# Patient Record
Sex: Male | Born: 1935 | Race: White | Hispanic: No | Marital: Married | Smoking: Former smoker
Health system: Southern US, Community
[De-identification: ages and names within clinical notes are randomized; demographics above are authoritative.]

## PROBLEM LIST (undated history)

## (undated) DIAGNOSIS — I10 Essential (primary) hypertension: Secondary | ICD-10-CM

## (undated) DIAGNOSIS — Z860100 Personal history of colon polyps, unspecified: Secondary | ICD-10-CM

## (undated) DIAGNOSIS — E109 Type 1 diabetes mellitus without complications: Secondary | ICD-10-CM

## (undated) DIAGNOSIS — E785 Hyperlipidemia, unspecified: Secondary | ICD-10-CM

## (undated) DIAGNOSIS — I82409 Acute embolism and thrombosis of unspecified deep veins of unspecified lower extremity: Secondary | ICD-10-CM

## (undated) DIAGNOSIS — Z923 Personal history of irradiation: Secondary | ICD-10-CM

## (undated) DIAGNOSIS — I714 Abdominal aortic aneurysm, without rupture, unspecified: Secondary | ICD-10-CM

## (undated) DIAGNOSIS — C801 Malignant (primary) neoplasm, unspecified: Secondary | ICD-10-CM

## (undated) DIAGNOSIS — C109 Malignant neoplasm of oropharynx, unspecified: Secondary | ICD-10-CM

## (undated) DIAGNOSIS — N189 Chronic kidney disease, unspecified: Secondary | ICD-10-CM

## (undated) DIAGNOSIS — D649 Anemia, unspecified: Secondary | ICD-10-CM

## (undated) DIAGNOSIS — J449 Chronic obstructive pulmonary disease, unspecified: Secondary | ICD-10-CM

## (undated) HISTORY — PX: HEMORRHOID SURGERY: SHX153

## (undated) HISTORY — DX: Acute embolism and thrombosis of unspecified deep veins of unspecified lower extremity: I82.409

## (undated) HISTORY — DX: Essential (primary) hypertension: I10

## (undated) HISTORY — DX: Abdominal aortic aneurysm, without rupture, unspecified: I71.40

## (undated) HISTORY — PX: ABDOMINAL AORTIC ANEURYSM REPAIR: SUR1152

## (undated) HISTORY — PX: TONSILLECTOMY: SUR1361

## (undated) HISTORY — DX: Malignant (primary) neoplasm, unspecified: C80.1

## (undated) HISTORY — DX: Malignant neoplasm of oropharynx, unspecified: C10.9

## (undated) HISTORY — DX: Personal history of colon polyps, unspecified: Z86.0100

## (undated) HISTORY — PX: APPENDECTOMY: SHX54

## (undated) HISTORY — DX: Chronic obstructive pulmonary disease, unspecified: J44.9

## (undated) HISTORY — DX: Abdominal aortic aneurysm, without rupture: I71.4

## (undated) HISTORY — PX: OTHER SURGICAL HISTORY: SHX169

## (undated) HISTORY — DX: Anemia, unspecified: D64.9

## (undated) HISTORY — PX: COLONOSCOPY W/ POLYPECTOMY: SHX1380

## (undated) HISTORY — PX: ROTATOR CUFF REPAIR: SHX139

## (undated) HISTORY — DX: Personal history of colonic polyps: Z86.010

## (undated) HISTORY — DX: Personal history of irradiation: Z92.3

## (undated) HISTORY — DX: Type 1 diabetes mellitus without complications: E10.9

## (undated) HISTORY — DX: Chronic kidney disease, unspecified: N18.9

## (undated) HISTORY — PX: INGUINAL HERNIA REPAIR: SHX194

## (undated) HISTORY — DX: Hyperlipidemia, unspecified: E78.5

---

## 1998-07-12 ENCOUNTER — Ambulatory Visit (HOSPITAL_COMMUNITY): Admission: RE | Admit: 1998-07-12 | Discharge: 1998-07-12 | Payer: Self-pay | Admitting: Gastroenterology

## 2001-01-06 ENCOUNTER — Encounter: Payer: Self-pay | Admitting: Urology

## 2001-01-13 ENCOUNTER — Observation Stay (HOSPITAL_COMMUNITY): Admission: RE | Admit: 2001-01-13 | Discharge: 2001-01-14 | Payer: Self-pay | Admitting: Urology

## 2002-02-09 ENCOUNTER — Ambulatory Visit (HOSPITAL_COMMUNITY): Admission: RE | Admit: 2002-02-09 | Discharge: 2002-02-09 | Payer: Self-pay | Admitting: Gastroenterology

## 2002-02-09 ENCOUNTER — Encounter (INDEPENDENT_AMBULATORY_CARE_PROVIDER_SITE_OTHER): Payer: Self-pay | Admitting: Specialist

## 2003-04-05 ENCOUNTER — Ambulatory Visit (HOSPITAL_COMMUNITY): Admission: RE | Admit: 2003-04-05 | Discharge: 2003-04-05 | Payer: Self-pay | Admitting: Endocrinology

## 2003-05-06 ENCOUNTER — Encounter: Payer: Self-pay | Admitting: Vascular Surgery

## 2003-05-10 ENCOUNTER — Ambulatory Visit (HOSPITAL_COMMUNITY): Admission: RE | Admit: 2003-05-10 | Discharge: 2003-05-10 | Payer: Self-pay | Admitting: Vascular Surgery

## 2003-05-21 ENCOUNTER — Inpatient Hospital Stay (HOSPITAL_COMMUNITY): Admission: RE | Admit: 2003-05-21 | Discharge: 2003-05-24 | Payer: Self-pay | Admitting: Vascular Surgery

## 2003-05-21 ENCOUNTER — Encounter: Payer: Self-pay | Admitting: Vascular Surgery

## 2003-05-24 ENCOUNTER — Observation Stay (HOSPITAL_COMMUNITY): Admission: EM | Admit: 2003-05-24 | Discharge: 2003-05-25 | Payer: Self-pay

## 2003-12-29 ENCOUNTER — Encounter: Admission: RE | Admit: 2003-12-29 | Discharge: 2003-12-29 | Payer: Self-pay | Admitting: Vascular Surgery

## 2004-01-18 ENCOUNTER — Encounter (INDEPENDENT_AMBULATORY_CARE_PROVIDER_SITE_OTHER): Payer: Self-pay | Admitting: Specialist

## 2004-01-18 ENCOUNTER — Inpatient Hospital Stay (HOSPITAL_COMMUNITY): Admission: RE | Admit: 2004-01-18 | Discharge: 2004-01-24 | Payer: Self-pay | Admitting: Vascular Surgery

## 2004-11-28 ENCOUNTER — Ambulatory Visit: Payer: Self-pay | Admitting: Family Medicine

## 2005-02-19 ENCOUNTER — Ambulatory Visit: Payer: Self-pay | Admitting: Family Medicine

## 2005-02-26 ENCOUNTER — Ambulatory Visit: Payer: Self-pay | Admitting: Family Medicine

## 2005-02-28 ENCOUNTER — Encounter: Admission: RE | Admit: 2005-02-28 | Discharge: 2005-02-28 | Payer: Self-pay | Admitting: Family Medicine

## 2005-03-07 ENCOUNTER — Ambulatory Visit: Payer: Self-pay | Admitting: Cardiovascular Disease

## 2005-03-22 ENCOUNTER — Ambulatory Visit: Payer: Self-pay

## 2005-03-29 ENCOUNTER — Ambulatory Visit: Payer: Self-pay | Admitting: Cardiovascular Disease

## 2005-04-23 ENCOUNTER — Ambulatory Visit: Payer: Self-pay | Admitting: Cardiology

## 2005-07-10 ENCOUNTER — Ambulatory Visit: Payer: Self-pay | Admitting: Family Medicine

## 2007-07-09 DIAGNOSIS — E109 Type 1 diabetes mellitus without complications: Secondary | ICD-10-CM

## 2007-07-09 DIAGNOSIS — Z860100 Personal history of colon polyps, unspecified: Secondary | ICD-10-CM | POA: Insufficient documentation

## 2007-07-09 DIAGNOSIS — I10 Essential (primary) hypertension: Secondary | ICD-10-CM | POA: Insufficient documentation

## 2008-05-25 ENCOUNTER — Ambulatory Visit: Payer: Self-pay | Admitting: Vascular Surgery

## 2008-07-04 DIAGNOSIS — M25559 Pain in unspecified hip: Secondary | ICD-10-CM

## 2008-07-06 ENCOUNTER — Telehealth: Payer: Self-pay | Admitting: Family Medicine

## 2008-07-08 ENCOUNTER — Ambulatory Visit: Payer: Self-pay | Admitting: Family Medicine

## 2008-07-09 ENCOUNTER — Ambulatory Visit: Payer: Self-pay | Admitting: Family Medicine

## 2008-11-23 ENCOUNTER — Ambulatory Visit: Payer: Self-pay | Admitting: Family Medicine

## 2008-11-23 DIAGNOSIS — F17200 Nicotine dependence, unspecified, uncomplicated: Secondary | ICD-10-CM | POA: Insufficient documentation

## 2009-01-05 ENCOUNTER — Ambulatory Visit: Payer: Self-pay | Admitting: Family Medicine

## 2009-01-05 DIAGNOSIS — E785 Hyperlipidemia, unspecified: Secondary | ICD-10-CM

## 2009-01-10 ENCOUNTER — Telehealth: Payer: Self-pay | Admitting: Family Medicine

## 2009-03-20 ENCOUNTER — Emergency Department (HOSPITAL_COMMUNITY): Admission: EM | Admit: 2009-03-20 | Discharge: 2009-03-21 | Payer: Self-pay | Admitting: Emergency Medicine

## 2009-04-04 ENCOUNTER — Telehealth: Payer: Self-pay | Admitting: Family Medicine

## 2009-12-29 LAB — HM DIABETES EYE EXAM

## 2010-01-20 ENCOUNTER — Ambulatory Visit: Payer: Self-pay | Admitting: Family Medicine

## 2010-01-20 DIAGNOSIS — R0602 Shortness of breath: Secondary | ICD-10-CM | POA: Insufficient documentation

## 2010-01-20 LAB — HM DIABETES FOOT EXAM

## 2010-01-23 ENCOUNTER — Telehealth (INDEPENDENT_AMBULATORY_CARE_PROVIDER_SITE_OTHER): Payer: Self-pay | Admitting: *Deleted

## 2010-01-23 ENCOUNTER — Ambulatory Visit: Payer: Self-pay | Admitting: Cardiology

## 2010-01-24 ENCOUNTER — Ambulatory Visit: Payer: Self-pay | Admitting: Internal Medicine

## 2010-01-25 ENCOUNTER — Telehealth (INDEPENDENT_AMBULATORY_CARE_PROVIDER_SITE_OTHER): Payer: Self-pay | Admitting: *Deleted

## 2010-01-26 ENCOUNTER — Encounter: Payer: Self-pay | Admitting: Cardiovascular Disease

## 2010-01-26 ENCOUNTER — Ambulatory Visit: Payer: Self-pay | Admitting: Cardiology

## 2010-01-26 ENCOUNTER — Encounter (HOSPITAL_COMMUNITY): Admission: RE | Admit: 2010-01-26 | Discharge: 2010-03-16 | Payer: Self-pay | Admitting: Cardiology

## 2010-01-26 ENCOUNTER — Encounter (INDEPENDENT_AMBULATORY_CARE_PROVIDER_SITE_OTHER): Payer: Self-pay

## 2010-01-26 ENCOUNTER — Ambulatory Visit: Payer: Self-pay

## 2010-01-26 ENCOUNTER — Encounter: Payer: Self-pay | Admitting: Family Medicine

## 2010-02-14 ENCOUNTER — Telehealth: Payer: Self-pay | Admitting: *Deleted

## 2010-02-20 ENCOUNTER — Ambulatory Visit: Payer: Self-pay | Admitting: Family Medicine

## 2010-04-04 ENCOUNTER — Encounter: Admission: RE | Admit: 2010-04-04 | Discharge: 2010-04-04 | Payer: Self-pay | Admitting: Orthopedic Surgery

## 2010-04-28 ENCOUNTER — Encounter (INDEPENDENT_AMBULATORY_CARE_PROVIDER_SITE_OTHER): Payer: Self-pay | Admitting: Otolaryngology

## 2010-04-28 ENCOUNTER — Ambulatory Visit: Payer: Self-pay | Admitting: Oncology

## 2010-04-28 ENCOUNTER — Ambulatory Visit (HOSPITAL_COMMUNITY): Admission: RE | Admit: 2010-04-28 | Discharge: 2010-04-29 | Payer: Self-pay | Admitting: Otolaryngology

## 2010-04-28 DIAGNOSIS — C801 Malignant (primary) neoplasm, unspecified: Secondary | ICD-10-CM

## 2010-04-28 HISTORY — DX: Malignant (primary) neoplasm, unspecified: C80.1

## 2010-05-03 LAB — COMPREHENSIVE METABOLIC PANEL
ALT: 12 U/L (ref 0–53)
Albumin: 3.6 g/dL (ref 3.5–5.2)
BUN: 23 mg/dL (ref 6–23)
CO2: 24 mEq/L (ref 19–32)
Calcium: 9.3 mg/dL (ref 8.4–10.5)
Chloride: 111 mEq/L (ref 96–112)
Creatinine, Ser: 1.45 mg/dL (ref 0.40–1.50)
Potassium: 4.1 mEq/L (ref 3.5–5.3)

## 2010-05-03 LAB — CBC WITH DIFFERENTIAL/PLATELET
BASO%: 0.4 % (ref 0.0–2.0)
Basophils Absolute: 0 10*3/uL (ref 0.0–0.1)
HCT: 38.9 % (ref 38.4–49.9)
HGB: 13.2 g/dL (ref 13.0–17.1)
MONO#: 0.6 10*3/uL (ref 0.1–0.9)
NEUT%: 60.9 % (ref 39.0–75.0)
WBC: 8.2 10*3/uL (ref 4.0–10.3)
lymph#: 2.3 10*3/uL (ref 0.9–3.3)

## 2010-05-04 ENCOUNTER — Ambulatory Visit: Admission: RE | Admit: 2010-05-04 | Discharge: 2010-07-24 | Payer: Self-pay | Admitting: Radiation Oncology

## 2010-05-08 ENCOUNTER — Ambulatory Visit (HOSPITAL_COMMUNITY): Admission: RE | Admit: 2010-05-08 | Discharge: 2010-05-08 | Payer: Self-pay | Admitting: Oncology

## 2010-05-09 ENCOUNTER — Ambulatory Visit: Payer: Self-pay | Admitting: Dentistry

## 2010-05-09 ENCOUNTER — Encounter: Admission: AD | Admit: 2010-05-09 | Discharge: 2010-05-09 | Payer: Self-pay | Admitting: Dentistry

## 2010-05-12 ENCOUNTER — Ambulatory Visit (HOSPITAL_COMMUNITY): Admission: RE | Admit: 2010-05-12 | Discharge: 2010-05-12 | Payer: Self-pay | Admitting: Dentistry

## 2010-05-12 ENCOUNTER — Ambulatory Visit: Payer: Self-pay | Admitting: Dentistry

## 2010-05-24 ENCOUNTER — Encounter
Admission: RE | Admit: 2010-05-24 | Discharge: 2010-08-22 | Payer: Self-pay | Source: Home / Self Care | Admitting: Radiation Oncology

## 2010-05-29 ENCOUNTER — Ambulatory Visit: Payer: Self-pay | Admitting: Oncology

## 2010-05-31 LAB — COMPREHENSIVE METABOLIC PANEL
AST: 16 U/L (ref 0–37)
Alkaline Phosphatase: 73 U/L (ref 39–117)
BUN: 23 mg/dL (ref 6–23)
Glucose, Bld: 80 mg/dL (ref 70–99)
Total Bilirubin: 0.3 mg/dL (ref 0.3–1.2)

## 2010-05-31 LAB — CBC WITH DIFFERENTIAL/PLATELET
Basophils Absolute: 0.1 10*3/uL (ref 0.0–0.1)
Eosinophils Absolute: 0.3 10*3/uL (ref 0.0–0.5)
HCT: 38.5 % (ref 38.4–49.9)
HGB: 12.6 g/dL — ABNORMAL LOW (ref 13.0–17.1)
LYMPH%: 23.5 % (ref 14.0–49.0)
MCH: 30.7 pg (ref 27.2–33.4)
MCV: 93.7 fL (ref 79.3–98.0)
MONO%: 10.1 % (ref 0.0–14.0)
NEUT#: 4.4 10*3/uL (ref 1.5–6.5)
NEUT%: 62 % (ref 39.0–75.0)
Platelets: 220 10*3/uL (ref 140–400)
RDW: 14 % (ref 11.0–14.6)

## 2010-06-05 ENCOUNTER — Ambulatory Visit (HOSPITAL_COMMUNITY): Admission: RE | Admit: 2010-06-05 | Discharge: 2010-06-05 | Payer: Self-pay | Admitting: Oncology

## 2010-06-07 LAB — CBC WITH DIFFERENTIAL/PLATELET
Basophils Absolute: 0 10*3/uL (ref 0.0–0.1)
EOS%: 2.8 % (ref 0.0–7.0)
HCT: 38 % — ABNORMAL LOW (ref 38.4–49.9)
HGB: 12.4 g/dL — ABNORMAL LOW (ref 13.0–17.1)
MCH: 30.4 pg (ref 27.2–33.4)
MCV: 93.1 fL (ref 79.3–98.0)
NEUT%: 78.3 % — ABNORMAL HIGH (ref 39.0–75.0)
lymph#: 1.1 10*3/uL (ref 0.9–3.3)

## 2010-06-07 LAB — COMPREHENSIVE METABOLIC PANEL
ALT: 11 U/L (ref 0–53)
AST: 13 U/L (ref 0–37)
Alkaline Phosphatase: 60 U/L (ref 39–117)
Creatinine, Ser: 1.29 mg/dL (ref 0.40–1.50)
Total Bilirubin: 0.4 mg/dL (ref 0.3–1.2)

## 2010-06-07 LAB — MAGNESIUM: Magnesium: 2 mg/dL (ref 1.5–2.5)

## 2010-06-14 LAB — COMPREHENSIVE METABOLIC PANEL
Albumin: 3.2 g/dL — ABNORMAL LOW (ref 3.5–5.2)
Alkaline Phosphatase: 70 U/L (ref 39–117)
BUN: 19 mg/dL (ref 6–23)
Calcium: 8.3 mg/dL — ABNORMAL LOW (ref 8.4–10.5)
Chloride: 108 mEq/L (ref 96–112)
Glucose, Bld: 184 mg/dL — ABNORMAL HIGH (ref 70–99)
Potassium: 4.5 mEq/L (ref 3.5–5.3)

## 2010-06-14 LAB — CBC WITH DIFFERENTIAL/PLATELET
Eosinophils Absolute: 0.1 10*3/uL (ref 0.0–0.5)
MONO#: 0.5 10*3/uL (ref 0.1–0.9)
NEUT#: 4.8 10*3/uL (ref 1.5–6.5)
RBC: 3.99 10*6/uL — ABNORMAL LOW (ref 4.20–5.82)
RDW: 14 % (ref 11.0–14.6)
WBC: 6.2 10*3/uL (ref 4.0–10.3)
lymph#: 0.9 10*3/uL (ref 0.9–3.3)
nRBC: 0 % (ref 0–0)

## 2010-06-14 LAB — MAGNESIUM: Magnesium: 2.3 mg/dL (ref 1.5–2.5)

## 2010-06-21 LAB — CBC WITH DIFFERENTIAL/PLATELET
BASO%: 0.4 % (ref 0.0–2.0)
Eosinophils Absolute: 0.1 10*3/uL (ref 0.0–0.5)
HCT: 36.3 % — ABNORMAL LOW (ref 38.4–49.9)
LYMPH%: 12.2 % — ABNORMAL LOW (ref 14.0–49.0)
MCHC: 32.5 g/dL (ref 32.0–36.0)
MONO#: 0.5 10*3/uL (ref 0.1–0.9)
NEUT%: 76.8 % — ABNORMAL HIGH (ref 39.0–75.0)
Platelets: 226 10*3/uL (ref 140–400)
WBC: 4.9 10*3/uL (ref 4.0–10.3)

## 2010-06-21 LAB — COMPREHENSIVE METABOLIC PANEL
Alkaline Phosphatase: 69 U/L (ref 39–117)
BUN: 41 mg/dL — ABNORMAL HIGH (ref 6–23)
CO2: 27 mEq/L (ref 19–32)
Calcium: 8.6 mg/dL (ref 8.4–10.5)
Chloride: 99 mEq/L (ref 96–112)
Creatinine, Ser: 1.47 mg/dL (ref 0.40–1.50)
Creatinine, Ser: 1.34 mg/dL (ref 0.40–1.50)
Glucose, Bld: 412 mg/dL — ABNORMAL HIGH (ref 70–99)
Sodium: 134 mEq/L — ABNORMAL LOW (ref 135–145)
Total Bilirubin: 0.6 mg/dL (ref 0.3–1.2)
Total Protein: 6.5 g/dL (ref 6.0–8.3)

## 2010-06-21 LAB — MAGNESIUM
Magnesium: 2.6 mg/dL — ABNORMAL HIGH (ref 1.5–2.5)
Magnesium: 2.3 mg/dL (ref 1.5–2.5)

## 2010-06-30 ENCOUNTER — Ambulatory Visit: Payer: Self-pay | Admitting: Oncology

## 2010-07-04 LAB — CBC WITH DIFFERENTIAL/PLATELET
Basophils Absolute: 0 10*3/uL (ref 0.0–0.1)
Eosinophils Absolute: 0 10*3/uL (ref 0.0–0.5)
HCT: 30.9 % — ABNORMAL LOW (ref 38.4–49.9)
HGB: 10.6 g/dL — ABNORMAL LOW (ref 13.0–17.1)
LYMPH%: 7.3 % — ABNORMAL LOW (ref 14.0–49.0)
MCHC: 34.2 g/dL (ref 32.0–36.0)
MONO#: 0.2 10*3/uL (ref 0.1–0.9)
NEUT#: 2.9 10*3/uL (ref 1.5–6.5)
NEUT%: 87.4 % — ABNORMAL HIGH (ref 39.0–75.0)
Platelets: 133 10*3/uL — ABNORMAL LOW (ref 140–400)
WBC: 3.3 10*3/uL — ABNORMAL LOW (ref 4.0–10.3)
lymph#: 0.2 10*3/uL — ABNORMAL LOW (ref 0.9–3.3)

## 2010-07-04 LAB — COMPREHENSIVE METABOLIC PANEL
ALT: 17 U/L (ref 0–53)
BUN: 44 mg/dL — ABNORMAL HIGH (ref 6–23)
CO2: 26 mEq/L (ref 19–32)
Calcium: 8.6 mg/dL (ref 8.4–10.5)
Chloride: 99 mEq/L (ref 96–112)
Creatinine, Ser: 1.4 mg/dL (ref 0.40–1.50)
Glucose, Bld: 327 mg/dL — ABNORMAL HIGH (ref 70–99)
Total Bilirubin: 0.5 mg/dL (ref 0.3–1.2)

## 2010-07-04 LAB — MAGNESIUM: Magnesium: 2.1 mg/dL (ref 1.5–2.5)

## 2010-07-12 LAB — CBC WITH DIFFERENTIAL/PLATELET
BASO%: 0.6 % (ref 0.0–2.0)
Basophils Absolute: 0 10*3/uL (ref 0.0–0.1)
EOS%: 0.6 % (ref 0.0–7.0)
HCT: 28 % — ABNORMAL LOW (ref 38.4–49.9)
HGB: 9.2 g/dL — ABNORMAL LOW (ref 13.0–17.1)
MCH: 30.7 pg (ref 27.2–33.4)
MCHC: 32.9 g/dL (ref 32.0–36.0)
MCV: 93.3 fL (ref 79.3–98.0)
MONO%: 8.9 % (ref 0.0–14.0)
NEUT%: 70.3 % (ref 39.0–75.0)
RDW: 15.1 % — ABNORMAL HIGH (ref 11.0–14.6)
lymph#: 0.3 10*3/uL — ABNORMAL LOW (ref 0.9–3.3)

## 2010-07-12 LAB — COMPREHENSIVE METABOLIC PANEL
ALT: 20 U/L (ref 0–53)
AST: 18 U/L (ref 0–37)
CO2: 25 mEq/L (ref 19–32)
Calcium: 8.1 mg/dL — ABNORMAL LOW (ref 8.4–10.5)
Chloride: 97 mEq/L (ref 96–112)
Creatinine, Ser: 1.19 mg/dL (ref 0.40–1.50)
Sodium: 131 mEq/L — ABNORMAL LOW (ref 135–145)
Total Protein: 6.4 g/dL (ref 6.0–8.3)

## 2010-07-12 LAB — MAGNESIUM: Magnesium: 2.1 mg/dL (ref 1.5–2.5)

## 2010-07-19 LAB — COMPREHENSIVE METABOLIC PANEL
ALT: 27 U/L (ref 0–53)
AST: 20 U/L (ref 0–37)
Albumin: 2.6 g/dL — ABNORMAL LOW (ref 3.5–5.2)
Alkaline Phosphatase: 78 U/L (ref 39–117)
BUN: 44 mg/dL — ABNORMAL HIGH (ref 6–23)
CO2: 27 mEq/L (ref 19–32)
Calcium: 8.8 mg/dL (ref 8.4–10.5)
Chloride: 102 mEq/L (ref 96–112)
Creatinine, Ser: 1.37 mg/dL (ref 0.40–1.50)
Glucose, Bld: 539 mg/dL — ABNORMAL HIGH (ref 70–99)
Potassium: 5.1 mEq/L (ref 3.5–5.3)
Sodium: 137 mEq/L (ref 135–145)
Total Bilirubin: 0.9 mg/dL (ref 0.3–1.2)
Total Protein: 7 g/dL (ref 6.0–8.3)

## 2010-07-19 LAB — CBC WITH DIFFERENTIAL/PLATELET
BASO%: 0 % (ref 0.0–2.0)
Basophils Absolute: 0 10*3/uL (ref 0.0–0.1)
EOS%: 1 % (ref 0.0–7.0)
Eosinophils Absolute: 0 10*3/uL (ref 0.0–0.5)
HCT: 25.5 % — ABNORMAL LOW (ref 38.4–49.9)
HGB: 8.3 g/dL — ABNORMAL LOW (ref 13.0–17.1)
LYMPH%: 19 % (ref 14.0–49.0)
MCH: 30.5 pg (ref 27.2–33.4)
MCHC: 32.5 g/dL (ref 32.0–36.0)
MCV: 93.8 fL (ref 79.3–98.0)
MONO#: 0.2 10*3/uL (ref 0.1–0.9)
MONO%: 17.1 % — ABNORMAL HIGH (ref 0.0–14.0)
NEUT#: 0.7 10*3/uL — ABNORMAL LOW (ref 1.5–6.5)
NEUT%: 62.9 % (ref 39.0–75.0)
Platelets: 176 10*3/uL (ref 140–400)
RBC: 2.72 10*6/uL — ABNORMAL LOW (ref 4.20–5.82)
RDW: 15.5 % — ABNORMAL HIGH (ref 11.0–14.6)
WBC: 1.1 10*3/uL — ABNORMAL LOW (ref 4.0–10.3)
lymph#: 0.2 10*3/uL — ABNORMAL LOW (ref 0.9–3.3)
nRBC: 2 % — ABNORMAL HIGH (ref 0–0)

## 2010-07-19 LAB — MAGNESIUM: Magnesium: 2.4 mg/dL (ref 1.5–2.5)

## 2010-07-19 LAB — TECHNOLOGIST REVIEW

## 2010-07-21 ENCOUNTER — Encounter (HOSPITAL_COMMUNITY)
Admission: RE | Admit: 2010-07-21 | Discharge: 2010-10-13 | Payer: Self-pay | Source: Home / Self Care | Attending: Oncology | Admitting: Oncology

## 2010-07-21 LAB — CBC WITH DIFFERENTIAL/PLATELET
BASO%: 1.1 % (ref 0.0–2.0)
Basophils Absolute: 0 10*3/uL (ref 0.0–0.1)
EOS%: 0.4 % (ref 0.0–7.0)
Eosinophils Absolute: 0 10*3/uL (ref 0.0–0.5)
HCT: 26.1 % — ABNORMAL LOW (ref 38.4–49.9)
HGB: 8.4 g/dL — ABNORMAL LOW (ref 13.0–17.1)
LYMPH%: 9.8 % — ABNORMAL LOW (ref 14.0–49.0)
MCH: 30.7 pg (ref 27.2–33.4)
MCHC: 32.2 g/dL (ref 32.0–36.0)
MCV: 95.3 fL (ref 79.3–98.0)
MONO#: 0.3 10*3/uL (ref 0.1–0.9)
MONO%: 12.5 % (ref 0.0–14.0)
NEUT#: 2 10*3/uL (ref 1.5–6.5)
NEUT%: 76.2 % — ABNORMAL HIGH (ref 39.0–75.0)
Platelets: 155 10*3/uL (ref 140–400)
RBC: 2.74 10*6/uL — ABNORMAL LOW (ref 4.20–5.82)
RDW: 16.1 % — ABNORMAL HIGH (ref 11.0–14.6)
WBC: 2.7 10*3/uL — ABNORMAL LOW (ref 4.0–10.3)
lymph#: 0.3 10*3/uL — ABNORMAL LOW (ref 0.9–3.3)
nRBC: 7 % — ABNORMAL HIGH (ref 0–0)

## 2010-07-21 LAB — HOLD TUBE, BLOOD BANK

## 2010-07-31 ENCOUNTER — Ambulatory Visit: Payer: Self-pay | Admitting: Oncology

## 2010-08-02 LAB — CBC WITH DIFFERENTIAL/PLATELET
BASO%: 0 % (ref 0.0–2.0)
EOS%: 0.5 % (ref 0.0–7.0)
LYMPH%: 7 % — ABNORMAL LOW (ref 14.0–49.0)
MCH: 32.8 pg (ref 27.2–33.4)
MCHC: 34.5 g/dL (ref 32.0–36.0)
MCV: 95.2 fL (ref 79.3–98.0)
MONO%: 9.2 % (ref 0.0–14.0)
Platelets: 296 10*3/uL (ref 140–400)
RBC: 3.15 10*6/uL — ABNORMAL LOW (ref 4.20–5.82)

## 2010-08-02 LAB — COMPREHENSIVE METABOLIC PANEL
ALT: 19 U/L (ref 0–53)
Alkaline Phosphatase: 91 U/L (ref 39–117)
Glucose, Bld: 429 mg/dL — ABNORMAL HIGH (ref 70–99)
Sodium: 132 mEq/L — ABNORMAL LOW (ref 135–145)
Total Bilirubin: 0.5 mg/dL (ref 0.3–1.2)
Total Protein: 6.1 g/dL (ref 6.0–8.3)

## 2010-08-15 LAB — CBC WITH DIFFERENTIAL/PLATELET
BASO%: 0.3 % (ref 0.0–2.0)
EOS%: 2.2 % (ref 0.0–7.0)
HCT: 33.2 % — ABNORMAL LOW (ref 38.4–49.9)
MCHC: 33.7 g/dL (ref 32.0–36.0)
MONO#: 0.6 10*3/uL (ref 0.1–0.9)
NEUT%: 78.9 % — ABNORMAL HIGH (ref 39.0–75.0)
RDW: 20.5 % — ABNORMAL HIGH (ref 11.0–14.6)
WBC: 7.3 10*3/uL (ref 4.0–10.3)
lymph#: 0.7 10*3/uL — ABNORMAL LOW (ref 0.9–3.3)

## 2010-08-15 LAB — COMPREHENSIVE METABOLIC PANEL
ALT: 16 U/L (ref 0–53)
AST: 17 U/L (ref 0–37)
Albumin: 3.5 g/dL (ref 3.5–5.2)
CO2: 21 mEq/L (ref 19–32)
Calcium: 8.9 mg/dL (ref 8.4–10.5)
Chloride: 101 mEq/L (ref 96–112)
Potassium: 4.4 mEq/L (ref 3.5–5.3)
Sodium: 138 mEq/L (ref 135–145)
Total Protein: 6.8 g/dL (ref 6.0–8.3)

## 2010-08-15 LAB — MAGNESIUM: Magnesium: 2.1 mg/dL (ref 1.5–2.5)

## 2010-08-23 ENCOUNTER — Encounter
Admission: RE | Admit: 2010-08-23 | Discharge: 2010-10-04 | Payer: Self-pay | Source: Home / Self Care | Attending: Radiation Oncology | Admitting: Radiation Oncology

## 2010-08-25 ENCOUNTER — Encounter: Payer: Self-pay | Admitting: Family Medicine

## 2010-08-30 ENCOUNTER — Ambulatory Visit: Payer: Self-pay | Admitting: Dentistry

## 2010-09-14 ENCOUNTER — Ambulatory Visit: Payer: Self-pay | Admitting: Oncology

## 2010-10-18 ENCOUNTER — Ambulatory Visit (HOSPITAL_COMMUNITY)
Admission: RE | Admit: 2010-10-18 | Discharge: 2010-10-18 | Payer: Self-pay | Source: Home / Self Care | Attending: Oncology | Admitting: Oncology

## 2010-10-18 ENCOUNTER — Ambulatory Visit: Payer: Self-pay | Admitting: Oncology

## 2010-10-18 LAB — GLUCOSE, CAPILLARY: Glucose-Capillary: 227 mg/dL — ABNORMAL HIGH (ref 70–99)

## 2010-10-19 LAB — CBC WITH DIFFERENTIAL/PLATELET
BASO%: 0.5 % (ref 0.0–2.0)
Basophils Absolute: 0 10*3/uL (ref 0.0–0.1)
EOS%: 3.2 % (ref 0.0–7.0)
Eosinophils Absolute: 0.2 10*3/uL (ref 0.0–0.5)
HCT: 37.3 % — ABNORMAL LOW (ref 38.4–49.9)
HGB: 11.9 g/dL — ABNORMAL LOW (ref 13.0–17.1)
LYMPH%: 21.6 % (ref 14.0–49.0)
MCH: 31.1 pg (ref 27.2–33.4)
MCHC: 31.9 g/dL — ABNORMAL LOW (ref 32.0–36.0)
MCV: 97.4 fL (ref 79.3–98.0)
MONO#: 0.5 10*3/uL (ref 0.1–0.9)
MONO%: 7.7 % (ref 0.0–14.0)
NEUT#: 4.3 10*3/uL (ref 1.5–6.5)
NEUT%: 67 % (ref 39.0–75.0)
Platelets: 198 10*3/uL (ref 140–400)
RBC: 3.83 10*6/uL — ABNORMAL LOW (ref 4.20–5.82)
RDW: 14.1 % (ref 11.0–14.6)
WBC: 6.3 10*3/uL (ref 4.0–10.3)
lymph#: 1.4 10*3/uL (ref 0.9–3.3)

## 2010-10-19 LAB — COMPREHENSIVE METABOLIC PANEL
ALT: 8 U/L (ref 0–53)
AST: 12 U/L (ref 0–37)
Albumin: 3.7 g/dL (ref 3.5–5.2)
Alkaline Phosphatase: 88 U/L (ref 39–117)
BUN: 18 mg/dL (ref 6–23)
CO2: 25 mEq/L (ref 19–32)
Calcium: 9.2 mg/dL (ref 8.4–10.5)
Chloride: 105 mEq/L (ref 96–112)
Creatinine, Ser: 1.29 mg/dL (ref 0.40–1.50)
Glucose, Bld: 145 mg/dL — ABNORMAL HIGH (ref 70–99)
Potassium: 4.1 mEq/L (ref 3.5–5.3)
Sodium: 140 mEq/L (ref 135–145)
Total Bilirubin: 0.5 mg/dL (ref 0.3–1.2)
Total Protein: 7.2 g/dL (ref 6.0–8.3)

## 2010-10-19 LAB — TSH: TSH: 1.128 u[IU]/mL (ref 0.350–4.500)

## 2010-10-23 ENCOUNTER — Telehealth: Payer: Self-pay | Admitting: Family Medicine

## 2010-10-23 DIAGNOSIS — R413 Other amnesia: Secondary | ICD-10-CM | POA: Insufficient documentation

## 2010-10-25 ENCOUNTER — Ambulatory Visit (HOSPITAL_COMMUNITY)
Admission: RE | Admit: 2010-10-25 | Discharge: 2010-10-25 | Payer: Self-pay | Source: Home / Self Care | Attending: Oncology | Admitting: Oncology

## 2010-11-03 ENCOUNTER — Other Ambulatory Visit: Payer: Self-pay | Admitting: Oncology

## 2010-11-03 DIAGNOSIS — C099 Malignant neoplasm of tonsil, unspecified: Secondary | ICD-10-CM

## 2010-11-04 ENCOUNTER — Other Ambulatory Visit: Payer: Self-pay | Admitting: Oncology

## 2010-11-04 DIAGNOSIS — C099 Malignant neoplasm of tonsil, unspecified: Secondary | ICD-10-CM

## 2010-11-05 ENCOUNTER — Encounter: Payer: Self-pay | Admitting: Cardiovascular Disease

## 2010-11-12 LAB — CONVERTED CEMR LAB
ALT: 19 units/L (ref 0–53)
ALT: 19 units/L (ref 0–53)
AST: 15 units/L (ref 0–37)
AST: 19 units/L (ref 0–37)
Albumin: 3.3 g/dL — ABNORMAL LOW (ref 3.5–5.2)
Albumin: 3.5 g/dL (ref 3.5–5.2)
Alkaline Phosphatase: 70 units/L (ref 39–117)
Alkaline Phosphatase: 85 units/L (ref 39–117)
BUN: 18 mg/dL (ref 6–23)
BUN: 24 mg/dL — ABNORMAL HIGH (ref 6–23)
Basophils Absolute: 0 10*3/uL (ref 0.0–0.1)
Basophils Absolute: 0.1 10*3/uL (ref 0.0–0.1)
Basophils Relative: 0.5 % (ref 0.0–3.0)
Basophils Relative: 1.2 % (ref 0.0–3.0)
Bilirubin, Direct: 0 mg/dL (ref 0.0–0.3)
Bilirubin, Direct: 0.1 mg/dL (ref 0.0–0.3)
CO2: 27 meq/L (ref 19–32)
CO2: 29 meq/L (ref 19–32)
Calcium: 8.5 mg/dL (ref 8.4–10.5)
Calcium: 8.5 mg/dL (ref 8.4–10.5)
Chloride: 107 meq/L (ref 96–112)
Chloride: 110 meq/L (ref 96–112)
Cholesterol: 149 mg/dL (ref 0–200)
Creatinine, Ser: 1.5 mg/dL (ref 0.4–1.5)
Creatinine, Ser: 1.6 mg/dL — ABNORMAL HIGH (ref 0.4–1.5)
Creatinine,U: 187.3 mg/dL
Eosinophils Absolute: 0.3 10*3/uL (ref 0.0–0.7)
Eosinophils Absolute: 0.3 10*3/uL (ref 0.0–0.7)
Eosinophils Relative: 2.7 % (ref 0.0–5.0)
Eosinophils Relative: 3.5 % (ref 0.0–5.0)
GFR calc non Af Amer: 45.09 mL/min (ref >60)
GFR calc non Af Amer: 48.71 mL/min (ref >60)
Glucose, Bld: 152 mg/dL — ABNORMAL HIGH (ref 70–99)
Glucose, Bld: 335 mg/dL — ABNORMAL HIGH (ref 70–99)
HCT: 38 % — ABNORMAL LOW (ref 39.0–52.0)
HCT: 39.7 % (ref 39.0–52.0)
HDL: 20.5 mg/dL — ABNORMAL LOW (ref >39.00)
Hemoglobin: 13.2 g/dL (ref 13.0–17.0)
Hemoglobin: 13.6 g/dL (ref 13.0–17.0)
Hgb A1c MFr Bld: 8 % — ABNORMAL HIGH (ref 4.6–6.5)
LDL Cholesterol: 105 mg/dL — ABNORMAL HIGH (ref 0–99)
Lymphocytes Relative: 14.6 % (ref 12.0–46.0)
Lymphocytes Relative: 22.5 % (ref 12.0–46.0)
Lymphs Abs: 1.5 10*3/uL (ref 0.7–4.0)
Lymphs Abs: 1.8 10*3/uL (ref 0.7–4.0)
MCHC: 34.2 g/dL (ref 30.0–36.0)
MCHC: 34.7 g/dL (ref 30.0–36.0)
MCV: 92.2 fL (ref 78.0–100.0)
MCV: 92.7 fL (ref 78.0–100.0)
Microalb Creat Ratio: 31.5 mg/g — ABNORMAL HIGH (ref 0.0–30.0)
Microalb, Ur: 5.9 mg/dL — ABNORMAL HIGH (ref 0.0–1.9)
Monocytes Absolute: 0.6 10*3/uL (ref 0.1–1.0)
Monocytes Absolute: 0.7 10*3/uL (ref 0.1–1.0)
Monocytes Relative: 6.8 % (ref 3.0–12.0)
Monocytes Relative: 7.8 % (ref 3.0–12.0)
Neutro Abs: 5.3 10*3/uL (ref 1.4–7.7)
Neutro Abs: 7.8 10*3/uL — ABNORMAL HIGH (ref 1.4–7.7)
Neutrophils Relative %: 65 % (ref 43.0–77.0)
Neutrophils Relative %: 75.4 % (ref 43.0–77.0)
PSA: 0.33 ng/mL (ref 0.10–4.00)
PSA: 0.41 ng/mL (ref 0.10–4.00)
Platelets: 226 10*3/uL (ref 150.0–400.0)
Platelets: 250 10*3/uL (ref 150.0–400.0)
Potassium: 4.5 meq/L (ref 3.5–5.1)
Potassium: 4.6 meq/L (ref 3.5–5.1)
RBC: 4.12 M/uL — ABNORMAL LOW (ref 4.22–5.81)
RBC: 4.28 M/uL (ref 4.22–5.81)
RDW: 13.5 % (ref 11.5–14.6)
RDW: 14.4 % (ref 11.5–14.6)
Sodium: 141 meq/L (ref 135–145)
Sodium: 145 meq/L (ref 135–145)
TSH: 1.07 microintl units/mL (ref 0.35–5.50)
TSH: 1.14 microintl units/mL (ref 0.35–5.50)
Total Bilirubin: 0.5 mg/dL (ref 0.3–1.2)
Total Bilirubin: 0.8 mg/dL (ref 0.3–1.2)
Total CHOL/HDL Ratio: 7
Total Protein: 7.1 g/dL (ref 6.0–8.3)
Total Protein: 7.7 g/dL (ref 6.0–8.3)
Triglycerides: 118 mg/dL (ref 0.0–149.0)
VLDL: 23.6 mg/dL (ref 0.0–40.0)
WBC: 10.3 10*3/uL (ref 4.5–10.5)
WBC: 8.1 10*3/uL (ref 4.5–10.5)

## 2010-11-14 NOTE — Progress Notes (Signed)
Summary: Nuclear Pre-Procedure  Phone Note Outgoing Call   Call placed by: Milana Na, EMT-P,  January 25, 2010 4:26 PM Summary of Call: Reviewed information on Myoview Information Sheet (see scanned document for further details).  Spoke with patient.     Nuclear Med Background Indications for Stress Test: Evaluation for Ischemia   History: Emphysema, Myocardial Perfusion Study  History Comments: '05 AAA Repair 06/06 MPS EF 64% mild apical thinning (-) ischemia`  Symptoms: DOE, SOB    Nuclear Pre-Procedure Cardiac Risk Factors: Family History - CAD, Hypertension, IDDM Type 1, Lipids, PVD Height (in): 71  Nuclear Med Study Referring MD:  D.McLean

## 2010-11-14 NOTE — Letter (Signed)
Summary: [facility]  [facility]   Imported By: Maryln Gottron 09/13/2010 10:24:55  _____________________________________________________________________  External Attachment:    Type:   Image     Comment:   External Document

## 2010-11-14 NOTE — Assessment & Plan Note (Signed)
Summary: Cardiology Nuclear Study  Nuclear Med Background Indications for Stress Test: Evaluation for Ischemia   History: COPD, Emphysema, Myocardial Perfusion Study  History Comments: '04 (R) popliteal bypass; '05 AAA Repair; 6/06 [id number] apical thinning, no ischemia, EF=64%  Symptoms: DOE    Nuclear Pre-Procedure Cardiac Risk Factors: Family History - CAD, History of Smoking, Hypertension, IDDM Type 1, Lipids, PVD Caffeine/Decaff Intake: None NPO After: 10:00 PM Lungs: Clear.  O2 Sat 96% on RA. IV 0.9% NS with Angio Cath: 20g     IV Site: (R) AC IV Started by: Irean Hong RN Chest Size (in) 44     Height (in): 71 Weight (lb): 208 BMI: 29.11 Tech Comments: FBS=148 at 7:30 AM. No medications taken today.  Nuclear Med Study 1 or 2 day study:  1 day     Stress Test Type:  Stress Reading MD:  Olga Millers, MD     Referring MD:  Marca Ancona, MD Resting Radionuclide:  Technetium 71m Tetrofosmin     Resting Radionuclide Dose:  10.0 mCi  Stress Radionuclide:  Technetium 52m Tetrofosmin     Stress Radionuclide Dose:  32.4 mCi   Stress Protocol Exercise Time (min):  5:45 min     Max HR:  136 bpm     Predicted Max HR:  146 bpm  Max Systolic BP: 192 mm Hg     Percent Max HR:  93.15 %     METS: 7.0 Rate Pressure Product:  47829    Stress Test Technologist:  Rea [employer] CMA-N     Nuclear Technologist:  Domenic Polite CNMT  Rest Procedure  Myocardial perfusion imaging was performed at rest 45 minutes following the intravenous administration of Myoview Technetium 6m Tetrofosmin.  Stress Procedure  The patient exercised for 5:45.  The patient stopped due to fatigue and denied any chest pain.  He did c/o shortness of breath with exercise and his O2 Sat at peak exercise was 89%.  There were no significant ST-T wave changes, only occasional PVC's/ PAC's.  Myoview was injected at peak exercise and myocardial perfusion imaging was performed after a brief delay.  QPS Raw Data  Images:  Acuisition technically good; normal left ventricular size. Stress Images:  There is normal uptake in all areas. Rest Images:  Normal homogeneous uptake in all areas of the myocardium. Subtraction (SDS):  No evidence of ischemia. Transient Ischemic Dilatation:  .86  (Normal <1.22)  Lung/Heart Ratio:  .35  (Normal <0.45)  Quantitative Gated Spect Images QGS EDV:  94 ml QGS ESV:  32 ml QGS EF:  66 % QGS cine images:  Normal wall motion.   Overall Impression  Exercise Capacity: Fair exercise capacity. BP Response: Normal blood pressure response. Clinical Symptoms: There is dyspnea. ECG Impression: No significant ST segment change suggestive of ischemia. Overall Impression: There is no sign of scar or ischemia.  Appended Document: Cardiology Nuclear Study normal stress myoview  Appended Document: Cardiology Nuclear Study discussed results with pt by telephone

## 2010-11-14 NOTE — Progress Notes (Signed)
Summary: office visit  Phone Note Call from Patient   Summary of Call: patient would like to know if he needs to come in for a follow up visit to go over his tests? Initial call taken by: Kern Reap CMA Duncan Dull),  Feb 14, 2010 5:19 PM  Follow-up for Phone Call        yes Follow-up by: Roderick Pee MD,  Feb 15, 2010 8:19 AM  Additional Follow-up for Phone Call Additional follow up Details #1::        Phone Call Completed Additional Follow-up by: Kern Reap CMA Duncan Dull),  Feb 15, 2010 1:23 PM

## 2010-11-14 NOTE — Miscellaneous (Signed)
Summary: Orders Update pft charges  Clinical Lists Changes  Orders: Added new Service order of Carbon Monoxide diffusing w/capacity (94720) - Signed Added new Service order of Lung Volumes (94240) - Signed Added new Service order of Spirometry (Pre & Post) (94060) - Signed 

## 2010-11-14 NOTE — Miscellaneous (Signed)
Summary: Medication samples  Clinical Lists Changes     The patient here today for a myoview, and he is concerned about a recent switch in his medication from simvastatin ($15.00) to crestor $125.00 due to leg fatigue.  Discussed  with the message nurse Leotis Shames). Crestor doesn't come in generic, so crestor 10 mg samples, #28 one per day (Lot [id number], Exp. 12/13. The patient instructed  to call us back after trying samples, then will send in refill mail order per Lauren Brown/ Julienne Vogler,RN.

## 2010-11-14 NOTE — Assessment & Plan Note (Signed)
Summary: np6/ dyspnea/ type 1 quite smoking / sob/ secure horizone/ gd   Primary Provider:  Dr. Tawanna Cooler   History of Present Illness: 77 yo with history of HTN, DM, hyperlipidemia, AAA repair presents for evaluation of shortness of breath.  Patient reports some dyspnea for about a year.  He notes SOB with moderate to heavy exertion such as walking with a 5 gallon paint bucket or doing heavier yard work.  He is mildly short of breath with steps.  Walking on flat ground does not bother him much.  No orthopnea or PND.  No chest pain or tightness.  He has noted some mild leg weakness over time.  No pain.  His wife is concerned that this might be related to his simvastatin.  Patient quit smoking about 8 months ago.  CXR showed upper lobe bullous emphysema and possible lower lobe chronic interstitial lung disease.   ECG: NSR, possible old inferior MI  Labs (4/11): LDL 105, HDL 21, LFTs normal, hgbA1c 8%, K 4.6, creatinine 1.6  Current Medications (verified): 1)  Humalog 100 Unit/ml  Soln (Insulin Lispro (Human)) .... Use 45 Units Daily 2)  Lantus 100 Unit/ml  Soln (Insulin Glargine) .... Use 60 Units Daily Spread Over Meals 3)  Altace 5 Mg  Caps (Ramipril) .... Take One Tab Daily 4)  Adult Aspirin Low Strength 81 Mg  Tbdp (Aspirin) .... Take One Tablet Every Other Day 5)  Multivitamins  Tabs (Multiple Vitamin) .... Once Daily 6)  Vitamin C .... Once Daily 7)  Coq10 100 Mg Caps (Coenzyme Q10) .... Once Daily 8)  Simvastatin 80 Mg Tabs (Simvastatin) .[name].. 1 Tab @ Bedtime  Allergies (verified): No Known Drug Allergies  Past History:  Past Medical History: 1. High Cholesterol 2. AAA: Status post repair in 2005 3. 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  4. Diabetes mellitus, type I 5. Hypertension 6. Colonic polyps, hx of 7. penile implant 8. CKD: last creatinine 1.6 9. Probable emphysema: CXR (4/11) showed upper lobe bullous emphysema, lower lobe chronic interstitial  lung disease.   Family History: Family History Other cancer Family History of Stroke M 1st degree relative <50 Father CVA and MI in his late 71s  Social History: Retired Married Alcohol use-no Drug use-no Regular exercise-no Prior smoker, quit using Chantix in 8/10.   Review of Systems       All systems reviewed and negative except as per HPI.   Vital Signs:  Patient profile:   77 year old male Height:      71 inches Weight:      213 pounds BMI:     29.81 Pulse rate:   81 / minute Pulse rhythm:   regular BP sitting:   142 / 80  (left arm) Cuff size:   large  Vitals Entered By: Judithe Modest CMA (January 23, 2010 10:19 AM)  Physical Exam  General:  Well developed, well nourished, in no acute distress. Head:  normocephalic and atraumatic Nose:  no deformity, discharge, inflammation, or lesions Mouth:  Teeth, gums and palate normal. Oral mucosa normal. Neck:  Neck supple, no JVD. No masses, thyromegaly or abnormal cervical nodes. Lungs:  Clear but prolonged expiratory phase noted.  Heart:  Non-displaced PMI, chest non-tender; regular rate and rhythm, S1, S2 without murmurs, rubs or gallops. Carotid upstroke normal, no bruit. Pedals normal pulses. No edema, no varicosities. Abdomen:  Bowel sounds positive; abdomen soft and non-tender without masses, organomegaly, or hernias noted. No hepatosplenomegaly. Msk:  Back  normal, normal gait. Muscle strength and tone normal. Extremities:  No clubbing or cyanosis. Neurologic:  Alert and oriented x 3. Skin:  Intact without lesions or rashes. Psych:  Normal affect.   Impression & Recommendations:  Problem # 1:  DYSPNEA (ICD-786.05) I think that the most likely cause of the patient's dyspnea based on CXR and lung exam is probably COPD.  He was a heavy smoker prior to 8/10.  He does not appear to have volume overload/CHF on exam and does not have orthopnea.  He does have a number of risk factors for CAD, including HTN,  hyperlipidemia, DM, prior smoking, and family history.  He also has a possible old inferior MI on ECG.  I think that it would be reasonable to rule out dyspnea as an anginal equivalent with an ETT-myoview.  He has already been set up for PFTs by Dr. Tawanna Cooler.    Problem # 2:  HYPERLIPIDEMIA (ICD-272.4) LDL was a bit elevated given his diabetes and HDL was low when checked this month.  I think that it would be reasonable to replace the Zocor 80 mg daily with Crestor 10 mg daily.  Repeat lipids/LFTs in 2 months.    Other Orders: Nuclear Stress Test (Nuc Stress Test)  Patient Instructions: 1)  Your physician has recommended you make the following change in your medication:  2)  Stop Zocor(simvastatin) 3)  Start Crestor 10mg  daily 4)  Follow-up appointment with Dr Shirlee Latch in 2 months. 5)  Your physician recommends that you return for a FASTING lipid profile/liver profile in 2 months--786.09 272.0--you should have this a few days before you see Dr Shirlee Latch in 2 months 6)  Your physician has requested that you have an exercise stress myoview.  For further information please visit [email or web address]  Please follow instruction sheet, as given. Prescriptions: CRESTOR 10 MG TABS (ROSUVASTATIN CALCIUM) one tablet daily  #90 x 0   Entered by:   Katina Dung, RN, BSN   Authorized by:   Marca Ancona, MD   Signed by:   Katina Dung, RN, BSN on 01/23/2010   Method used:   Electronically to        [facility]* (mail-order)             ,          Ph: [phone]       Fax: [phone]   RxID:   [id number]

## 2010-11-14 NOTE — Assessment & Plan Note (Signed)
Summary: FOLLOW UP ON LABS AND TEST RESULTS/CJR/PT RSC/CJR   Primary Care Provider:  Dr. Tawanna Cooler   History of Present Illness: Dustin Vance is 77 year old married male ex-smoker x 6 months comes in today for follow-up of cardiopulmonary testing because of shortness of breath.  His cardiac studies appear normal with no evidence of coronary disease.  His pulmonary function study showed moderate COPD, unresponsive to bronchodilators.  The cardiologist changed his simvastatin to Crestor.  However, their co-pay, went from $10 to hundred and $25.  He was complaining at the time of a lot of leg discomfort.  However, his blood sugar was in the 300 range and now that his blood sugar is in the 100 to 120 range of a lot of his leg.  Symptoms have abated.  He stated to see his endocrinologist next week.  blood sugar from 300--------- 100 by paying attention to diet and exercise.  Insulin requirements stay the same  Allergies: No Known Drug Allergies  Past History:  Past medical, surgical, family and social histories (including risk factors) reviewed for relevance to current acute and chronic problems.  Past Medical History: Reviewed history from 01/23/2010 and no changes required. 1. High Cholesterol 2. AAA: Status post repair in 2005 3. 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  4. Diabetes mellitus, type I 5. Hypertension 6. Colonic polyps, hx of 7. penile implant 8. CKD: last creatinine 1.6 9. Probable emphysema: CXR (4/11) showed upper lobe bullous emphysema, lower lobe chronic interstitial lung disease.   Past Surgical History: Reviewed history from 01/05/2009 and no changes required. Abdominal aortic aneurysm repair Tonsillectomy Colon polypectomy Hemorrhoidectomy Inguinal herniorrhaphy penial implant  Family History: Reviewed history from 01/23/2010 and no changes required. Family History Other cancer Family History of Stroke M 1st degree relative <50 Father  CVA and MI in his late 17s  Social History: Reviewed history from 01/23/2010 and no changes required. Retired Married Alcohol use-no Drug use-no Regular exercise-no Prior smoker, quit using Chantix in 8/10.   Review of Systems      See HPI  Physical Exam  General:  Well-developed,well-nourished,in no acute distress; alert,appropriate and cooperative throughout examination   Impression & Recommendations:  Problem # 1:  DIABETES MELLITUS, TYPE I (ICD-250.01) Assessment Improved  His updated medication list for this problem includes:    Humalog 100 Unit/ml Soln (Insulin lispro (human)) ..... Use 45 units daily    Lantus 100 Unit/ml Soln (Insulin glargine) ..... Use 60 units daily spread over meals    Altace 5 Mg Caps (Ramipril) .[name]... Take one tab daily    Adult Aspirin Low Strength 81 Mg Tbdp (Aspirin) .[name]... Take one tablet every other day  Problem # 2:  DYSPNEA (ICD-786.05) Assessment: Unchanged  Complete Medication List: 1)  Humalog 100 Unit/ml Soln (Insulin lispro (human)) .... Use 45 units daily 2)  Lantus 100 Unit/ml Soln (Insulin glargine) .... Use 60 units daily spread over meals 3)  Altace 5 Mg Caps (Ramipril) .... Take one tab daily 4)  Adult Aspirin Low Strength 81 Mg Tbdp (Aspirin) .... Take one tablet every other day 5)  Multivitamins Tabs (Multiple vitamin) .... Once daily 6)  Vitamin C  .... Once daily 7)  Coq10 100 Mg Caps (Coenzyme q10) .... Once daily 8)  Simvastatin 80 Mg Tabs (Simvastatin) .[name].. 1 tab @ bedtime 9)  Qvar 40 Mcg/act Aers (Beclomethasone dipropionate) .[name].. 1 puff two times a day  Patient Instructions: 1)  stop the Crestor and resumed the simvastatin 80 mg  nightly 2)  Began Qvar one puff b.i.d. daily forever remember to swish and spit with mouthwash after he used the inhaled medication Prescriptions: QVAR 40 MCG/ACT AERS (BECLOMETHASONE DIPROPIONATE) 1 puff two times a day  #3 x 4   Entered and Authorized by:   Roderick Pee MD   Signed  by:   Roderick Pee MD on 02/20/2010   Method used:   Print then Give to Patient   RxID:   [id number] SIMVASTATIN 80 MG TABS (SIMVASTATIN) 1 tab @ bedtime  #100 x 3   Entered and Authorized by:   Roderick Pee MD   Signed by:   Roderick Pee MD on 02/20/2010   Method used:   Print then Give to Patient   RxID:   [id number]

## 2010-11-14 NOTE — Progress Notes (Signed)
  Pt Signed ROI,Mailed Problem LIst out to him  [facility]  January 23, 2010 3:39 PM

## 2010-11-14 NOTE — Assessment & Plan Note (Signed)
Summary: EMP---WILL FAST//CCM   Vital Signs:  Patient profile:   77 year old male Height:      71 inches Weight:      209 pounds BMI:     29.25 Temp:     97.9 degrees F oral BP sitting:   130 / 80  (left arm) Cuff size:   regular  Vitals Entered By: Kern Reap CMA Duncan Dull) (January 20, 2010 9:13 AM) CC: cpx Is Patient Diabetic? No Pain Assessment Patient in pain? no        CC:  cpx.  History of Present Illness: Dustin Vance is a 77 year old, married male ex smoker since August 2010 who comes in today for evaluation of multiple issues.  With the chantix program.  He finally quit smoking after 55+ years of smoking.  He states he had a cough for about 6 months.  Cough is gone away.  He would like a chest x-ray.  He is also concerned because he fatigues easily.  He states when he does any kind of exercise or work.  He gets short of breath and has to stop.  He denies any chest pain.  His underlying type 1 diabetes and is on Humalog 12 units prior to each meal and Lantus 30 units b.i.d.  In the past, month.  He's had one episode of hypoglycemia.  His hypertension is treated with Altace 5 mg daily BP 130/80.  Hyperlipidemia.  History with simvastatin 80 mg nightly Will check lipid panel.  He takes an aspirin tablet every other day because daily.  Aspirin, causes him to bruise a lot.  Penile implant still functioning okay.  He had an eye exam, March 17 by his ophthalmologist.  All was normal.  Also, this year.  He got bilateral hearing aids.  Tetanus 2006 Pneumovax 2010 seasonal flu shot 2010.  Preventive Screening-Counseling & Management  Alcohol-Tobacco     Smoking Status: quit  Allergies: No Known Drug Allergies  Past History:  Past medical, surgical, family and social histories (including risk factors) reviewed, and no changes noted (except as noted below).  Past Medical History: Reviewed history from 07/08/2008 and no changes required. High Cholesterol AAA TA Diabetes  mellitus, type I Hypertension Colonic polyps, hx of penile implant , right popliteal aneurysm  Past Surgical History: Reviewed history from 01/05/2009 and no changes required. Abdominal aortic aneurysm repair Tonsillectomy Colon polypectomy Hemorrhoidectomy Inguinal herniorrhaphy penial implant  Family History: Reviewed history from 07/09/2007 and no changes required. Family History Other cancer Family History of Stroke M 1st degree relative <50  Social History: Reviewed history from 07/09/2007 and no changes required. Occupation: Married  Alcohol use-no Drug use-no Regular exercise-no  Smoking Status:  quit  Review of Systems      See HPI  Physical Exam  General:  Well-developed,well-nourished,in no acute distress; alert,appropriate and cooperative throughout examination Head:  Normocephalic and atraumatic without obvious abnormalities. No apparent alopecia or balding. Eyes:  No corneal or conjunctival inflammation noted. EOMI. Perrla. Funduscopic exam benign, without hemorrhages, exudates or papilledema. Vision grossly normal. Ears:  External ear exam shows no significant lesions or deformities.  Otoscopic examination reveals clear canals, tympanic membranes are intact bilaterally without bulging, retraction, inflammation or discharge. Hearing is grossly normal bilaterally. Nose:  External nasal examination shows no deformity or inflammation. Nasal mucosa are pink and moist without lesions or exudates. Mouth:  Oral mucosa and oropharynx without lesions or exudates.  Teeth in good repair. Neck:  No deformities, masses, or tenderness noted. Chest  Wall:  No deformities, masses, tenderness or gynecomastia noted. Breasts:  No masses or gynecomastia noted Lungs:   decreased breath sounds, but no asymmetry Heart:  Normal rate and regular rhythm. S1 and S2 normal without gallop, murmur, click, rub or other extra sounds. Abdomen:  there is a scar in the midline from previous  surgery.  Above the umbilicus.  He has a small ventral hernia.  He's gained weight since he stopped smoking in the Rectal:  No external abnormalities noted. Normal sphincter tone. No rectal masses or tenderness. Genitalia:  penile impl. Prostate:  Prostate gland firm and smooth, no enlargement, nodularity, tenderness, mass, asymmetry or induration. Msk:  No deformity or scoliosis noted of thoracic or lumbar spine.   Pulses:  R and L carotid,radial,femoral,dorsalis pedis and posterior tibial pulses are full and equal bilaterally Extremities:  No clubbing, cyanosis, edema, or deformity noted with normal full range of motion of all joints.   Neurologic:  No cranial nerve deficits noted. Station and gait are normal. Plantar reflexes are down-going bilaterally. DTRs are symmetrical throughout. Sensory, motor and coordinative functions appear intact. Skin:  Intact without suspicious lesions or rashes Cervical Nodes:  No lymphadenopathy noted Axillary Nodes:  No palpable lymphadenopathy Inguinal Nodes:  No significant adenopathy Psych:  Cognition and judgment appear intact. Alert and cooperative with normal attention span and concentration. No apparent delusions, illusions, hallucinations  Diabetes Management Exam:    Foot Exam (with socks and/or shoes not present):       Sensory-Pinprick/Light touch:          Left medial foot (L-4): normal          Left dorsal foot (L-5): normal          Left lateral foot (S-1): normal          Right medial foot (L-4): normal          Right dorsal foot (L-5): normal          Right lateral foot (S-1): normal       Sensory-Monofilament:          Left foot: normal          Right foot: normal       Inspection:          Left foot: normal          Right foot: normal       Nails:          Left foot: normal          Right foot: normal    Eye Exam:       Eye Exam done elsewhere          Date: 12/29/2009          Results: normal          Done by:  opth   Impression & Recommendations:  Problem # 1:  HYPERLIPIDEMIA (ICD-272.4) Assessment Improved  His updated medication list for this problem includes:    Simvastatin 80 Mg Tabs (Simvastatin) .[name]... 1 tab @ bedtime  Orders: Venipuncture (44010) TLB-Lipid Panel (80061-LIPID) TLB-BMP (Basic Metabolic Panel-BMET) (80048-METABOL) TLB-Hepatic/Liver Function Pnl (80076-HEPATIC) TLB-CBC Platelet - w/Differential (85025-CBCD) TLB-TSH (Thyroid Stimulating Hormone) (84443-TSH) TLB-Microalbumin/Creat Ratio, Urine (82043-MALB) TLB-PSA (Prostate Specific Antigen) (84153-PSA) TLB-A1C / Hgb A1C (Glycohemoglobin) (83036-A1C) Prescription Created Electronically [phone]) Subsequent annual wellness visit with prevention plan (G6440) EKG w/ Interpretation (93000)  Problem # 2:  TOBACCO ABUSE (ICD-305.1) Assessment: Improved  The following medications were removed from the medication list:  Chantix Starting Month Pak 0.5 Mg X 11 & 1 Mg X 42 Tabs (Varenicline tartrate) ..... Uad    Chantix Continuing Month Pak 1 Mg Tabs (Varenicline tartrate) ..... Uad  Problem # 3:  HYPERTENSION (ICD-401.9) Assessment: Improved  His updated medication list for this problem includes:    Altace 5 Mg Caps (Ramipril)  Orders: Venipuncture (04540) TLB-Lipid Panel (80061-LIPID) TLB-BMP (Basic Metabolic Panel-BMET) (80048-METABOL) TLB-Hepatic/Liver Function Pnl (80076-HEPATIC) TLB-CBC Platelet - w/Differential (85025-CBCD) TLB-TSH (Thyroid Stimulating Hormone) (84443-TSH) TLB-Microalbumin/Creat Ratio, Urine (82043-MALB) TLB-PSA (Prostate Specific Antigen) (84153-PSA) TLB-A1C / Hgb A1C (Glycohemoglobin) (83036-A1C) Prescription Created Electronically [phone]) Subsequent annual wellness visit with prevention plan (J4782) EKG w/ Interpretation (93000)  Problem # 4:  DIABETES MELLITUS, TYPE I (ICD-250.01) Assessment: Deteriorated  His updated medication list for this problem includes:    Humalog 100 Unit/ml  Soln (Insulin lispro (human))    Lantus 100 Unit/ml Soln (Insulin glargine)    Altace 5 Mg Caps (Ramipril)    Adult Aspirin Low Strength 81 Mg Tbdp (Aspirin)  Orders: Venipuncture (95621) TLB-Lipid Panel (80061-LIPID) TLB-BMP (Basic Metabolic Panel-BMET) (80048-METABOL) TLB-Hepatic/Liver Function Pnl (80076-HEPATIC) TLB-CBC Platelet - w/Differential (85025-CBCD) TLB-TSH (Thyroid Stimulating Hormone) (84443-TSH) TLB-Microalbumin/Creat Ratio, Urine (82043-MALB) TLB-PSA (Prostate Specific Antigen) (84153-PSA) TLB-A1C / Hgb A1C (Glycohemoglobin) (83036-A1C) Prescription Created Electronically [phone]) Subsequent annual wellness visit with prevention plan (H8469) EKG w/ Interpretation (93000)  Problem # 5:  DYSPNEA (ICD-786.05) Assessment: New  Orders: Venipuncture (62952) TLB-Lipid Panel (80061-LIPID) TLB-BMP (Basic Metabolic Panel-BMET) (80048-METABOL) TLB-Hepatic/Liver Function Pnl (80076-HEPATIC) TLB-CBC Platelet - w/Differential (85025-CBCD) TLB-TSH (Thyroid Stimulating Hormone) (84443-TSH) TLB-Microalbumin/Creat Ratio, Urine (82043-MALB) TLB-PSA (Prostate Specific Antigen) (84153-PSA) TLB-A1C / Hgb A1C (Glycohemoglobin) (83036-A1C) Cardiology Referral (Cardiology)  Complete Medication List: 1)  Humalog 100 Unit/ml Soln (Insulin lispro (human)) 2)  Lantus 100 Unit/ml Soln (Insulin glargine) 3)  Altace 5 Mg Caps (Ramipril) 4)  Adult Aspirin Low Strength 81 Mg Tbdp (Aspirin) 5)  Multivitamins Tabs (Multiple vitamin) .... Once daily 6)  Vitamin C  .... Once daily 7)  Coq10 100 Mg Caps (Coenzyme q10) .... Once daily 8)  Simvastatin 80 Mg Tabs (Simvastatin) .[name].. 1 tab @ bedtime  Other Orders: Pulmonary Referral (Pulmonary) T-2 View CXR (71020TC)  Patient Instructions: 1)  go to the main office now for your chest x-ray. 2)  We will call you to report of your lab work  and  x-ray next week. 3)  We will also  set up for cardiopulmonary testing. 4)  Return to see me after  your cardiac and pulmonary testing for follow-up Prescriptions: SIMVASTATIN 80 MG TABS (SIMVASTATIN) 1 tab @ bedtime  #100 x 3   Entered and Authorized by:   Roderick Pee MD   Signed by:   Roderick Pee MD on 01/20/2010   Method used:   Print then Give to Patient   RxID:   [id number] ALTACE 5 MG  CAPS (RAMIPRIL)   #100 x 3   Entered and Authorized by:   Roderick Pee MD   Signed by:   Roderick Pee MD on 01/20/2010   Method used:   Print then Give to Patient   RxID:   [id number] LANTUS 100 UNIT/ML  SOLN (INSULIN GLARGINE)   #3 x 3   Entered and Authorized by:   Roderick Pee MD   Signed by:   Roderick Pee MD on 01/20/2010   Method used:   Print then Give to Patient   RxID:   [id number] HUMALOG 100 UNIT/ML  SOLN (INSULIN LISPRO (HUMAN))   #  3 x 3   Entered and Authorized by:   Roderick Pee MD   Signed by:   Roderick Pee MD on 01/20/2010   Method used:   Print then Give to Patient   RxID:   [id number]    Immunization History:  Influenza Immunization History:    Influenza:  historical (07/15/2009)

## 2010-11-16 NOTE — Progress Notes (Signed)
Summary: Pt req to get referral to Neurologist re: mood changes  Phone Note Call from Patient Call back at Home Phone [phone]   Caller: spouse- Dustin Vance Summary of Call: Pts wife called and said that she has noticed some mental changes in her husband since before cancer treatment, moodiness. Req referral to Neurologist. Pls advise.  If pt needs to be eval by pcp before referral can be made, pls notify pts wife.  Initial call taken by: Lucy Antigua,  October 23, 2010 11:53 AM  Follow-up for Phone Call        Fleet Contras please call find, out what's going on.........[name] mood changes, we would have him see a psychiatrist.......[name] Dr. Nolen Mu Follow-up by: Roderick Pee MD,  October 23, 2010 12:42 PM  Additional Follow-up for Phone Call Additional follow up Details #1::        left message for wife to return our call Additional Follow-up by: Kern Reap CMA Duncan Dull),  October 23, 2010 4:04 PM  New Problems: MEMORY LOSS (ICD-780.93)   Additional Follow-up for Phone Call Additional follow up Details #2::    wife thinks there may be some demnia, memory loss, mood changes and would like to see a neurologist if possilbe. Follow-up by: Kern Reap CMA Duncan Dull),  October 24, 2010 5:01 PM  Additional Follow-up for Phone Call Additional follow up Details #3:: Details for Additional Follow-up Action Taken: okayed he referred to [place] at neurologic for evaluation of possible dementia.  left message on machine. Additional Follow-up by: Roderick Pee MD,  October 24, 2010 5:40 PM  New Problems: MEMORY LOSS (ICD-780.93)

## 2010-11-24 ENCOUNTER — Ambulatory Visit: Payer: MEDICARE | Attending: Radiation Oncology | Admitting: Radiation Oncology

## 2010-11-24 DIAGNOSIS — C01 Malignant neoplasm of base of tongue: Secondary | ICD-10-CM | POA: Insufficient documentation

## 2010-12-27 LAB — CROSSMATCH

## 2010-12-27 LAB — ABO/RH: ABO/RH(D): A POS

## 2010-12-28 LAB — GLUCOSE, CAPILLARY
Glucose-Capillary: 114 mg/dL — ABNORMAL HIGH (ref 70–99)
Glucose-Capillary: 125 mg/dL — ABNORMAL HIGH (ref 70–99)
Glucose-Capillary: 125 mg/dL — ABNORMAL HIGH (ref 70–99)
Glucose-Capillary: 125 mg/dL — ABNORMAL HIGH (ref 70–99)
Glucose-Capillary: 125 mg/dL — ABNORMAL HIGH (ref 70–99)
Glucose-Capillary: 125 mg/dL — ABNORMAL HIGH (ref 70–99)
Glucose-Capillary: 125 mg/dL — ABNORMAL HIGH (ref 70–99)
Glucose-Capillary: 125 mg/dL — ABNORMAL HIGH (ref 70–99)
Glucose-Capillary: 125 mg/dL — ABNORMAL HIGH (ref 70–99)
Glucose-Capillary: 125 mg/dL — ABNORMAL HIGH (ref 70–99)

## 2010-12-28 LAB — CBC
MCHC: 34.4 g/dL (ref 30.0–36.0)
Platelets: 217 10*3/uL (ref 150–400)
RDW: 14 % (ref 11.5–15.5)
WBC: 5.8 10*3/uL (ref 4.0–10.5)

## 2010-12-28 LAB — PROTIME-INR
INR: 0.96 (ref 0.00–1.49)
Prothrombin Time: 13 seconds (ref 11.6–15.2)

## 2010-12-30 LAB — BASIC METABOLIC PANEL WITH GFR
CO2: 27 meq/L (ref 19–32)
Calcium: 8.4 mg/dL (ref 8.4–10.5)
Chloride: 104 meq/L (ref 96–112)
Creatinine, Ser: 1.81 mg/dL — ABNORMAL HIGH (ref 0.4–1.5)
GFR calc Af Amer: 45 mL/min — ABNORMAL LOW (ref >60)
Glucose, Bld: 251 mg/dL — ABNORMAL HIGH (ref 70–99)

## 2010-12-30 LAB — GLUCOSE, CAPILLARY
Glucose-Capillary: 103 mg/dL — ABNORMAL HIGH (ref 70–99)
Glucose-Capillary: 74 mg/dL (ref 70–99)
Glucose-Capillary: 91 mg/dL (ref 70–99)
Glucose-Capillary: 125 mg/dL — ABNORMAL HIGH (ref 70–99)
Glucose-Capillary: 135 mg/dL — ABNORMAL HIGH (ref 70–99)
Glucose-Capillary: 205 mg/dL — ABNORMAL HIGH (ref 70–99)
Glucose-Capillary: 208 mg/dL — ABNORMAL HIGH (ref 70–99)
Glucose-Capillary: 220 mg/dL — ABNORMAL HIGH (ref 70–99)
Glucose-Capillary: 238 mg/dL — ABNORMAL HIGH (ref 70–99)
Glucose-Capillary: 286 mg/dL — ABNORMAL HIGH (ref 70–99)
Glucose-Capillary: 328 mg/dL — ABNORMAL HIGH (ref 70–99)
Glucose-Capillary: 347 mg/dL — ABNORMAL HIGH (ref 70–99)
Glucose-Capillary: 363 mg/dL — ABNORMAL HIGH (ref 70–99)
Glucose-Capillary: 411 mg/dL — ABNORMAL HIGH (ref 70–99)
Glucose-Capillary: 429 mg/dL — ABNORMAL HIGH (ref 70–99)
Glucose-Capillary: 441 mg/dL — ABNORMAL HIGH (ref 70–99)

## 2010-12-30 LAB — BASIC METABOLIC PANEL
BUN: 29 mg/dL — ABNORMAL HIGH (ref 6–23)
GFR calc non Af Amer: 37 mL/min — ABNORMAL LOW (ref >60)
Potassium: 4.1 mEq/L (ref 3.5–5.1)
Sodium: 139 mEq/L (ref 135–145)

## 2010-12-31 LAB — CBC
HCT: 38.5 % — ABNORMAL LOW (ref 39.0–52.0)
Hemoglobin: 12.9 g/dL — ABNORMAL LOW (ref 13.0–17.0)
MCH: 31.3 pg (ref 26.0–34.0)
MCHC: 33.6 g/dL (ref 30.0–36.0)
MCV: 93.2 fL (ref 78.0–100.0)
Platelets: 258 K/uL (ref 150–400)
RBC: 4.13 MIL/uL — ABNORMAL LOW (ref 4.22–5.81)
RDW: 14 % (ref 11.5–15.5)
WBC: 7.8 K/uL (ref 4.0–10.5)

## 2010-12-31 LAB — URINALYSIS, ROUTINE W REFLEX MICROSCOPIC
Bilirubin Urine: NEGATIVE
Glucose, UA: NEGATIVE mg/dL
Hgb urine dipstick: NEGATIVE
Ketones, ur: NEGATIVE mg/dL
Nitrite: NEGATIVE
Protein, ur: NEGATIVE mg/dL
Specific Gravity, Urine: 1.023 (ref 1.005–1.030)
Urobilinogen, UA: 1 mg/dL (ref 0.0–1.0)
pH: 6.5 (ref 5.0–8.0)

## 2010-12-31 LAB — BASIC METABOLIC PANEL
BUN: 19 mg/dL (ref 6–23)
CO2: 28 mEq/L (ref 19–32)
GFR calc non Af Amer: 49 mL/min — ABNORMAL LOW (ref >60)
Glucose, Bld: 80 mg/dL (ref 70–99)
Potassium: 4.3 mEq/L (ref 3.5–5.1)

## 2010-12-31 LAB — SURGICAL PCR SCREEN
MRSA, PCR: NEGATIVE
Staphylococcus aureus: NEGATIVE

## 2010-12-31 LAB — BASIC METABOLIC PANEL WITH GFR
Calcium: 8.7 mg/dL (ref 8.4–10.5)
Chloride: 107 meq/L (ref 96–112)
Creatinine, Ser: 1.41 mg/dL (ref 0.4–1.5)
GFR calc Af Amer: 59 mL/min — ABNORMAL LOW (ref >60)
Sodium: 140 meq/L (ref 135–145)

## 2011-01-22 ENCOUNTER — Ambulatory Visit: Payer: Self-pay | Admitting: Family Medicine

## 2011-01-29 ENCOUNTER — Encounter: Payer: Self-pay | Admitting: Family Medicine

## 2011-01-30 ENCOUNTER — Encounter: Payer: Self-pay | Admitting: Family Medicine

## 2011-01-30 ENCOUNTER — Ambulatory Visit (INDEPENDENT_AMBULATORY_CARE_PROVIDER_SITE_OTHER): Payer: MEDICARE | Admitting: Family Medicine

## 2011-01-30 DIAGNOSIS — I10 Essential (primary) hypertension: Secondary | ICD-10-CM

## 2011-01-30 DIAGNOSIS — E785 Hyperlipidemia, unspecified: Secondary | ICD-10-CM

## 2011-01-30 DIAGNOSIS — F17200 Nicotine dependence, unspecified, uncomplicated: Secondary | ICD-10-CM

## 2011-01-30 DIAGNOSIS — E109 Type 1 diabetes mellitus without complications: Secondary | ICD-10-CM

## 2011-01-30 DIAGNOSIS — R339 Retention of urine, unspecified: Secondary | ICD-10-CM

## 2011-01-30 DIAGNOSIS — N401 Enlarged prostate with lower urinary tract symptoms: Secondary | ICD-10-CM

## 2011-01-30 DIAGNOSIS — C069 Malignant neoplasm of mouth, unspecified: Secondary | ICD-10-CM | POA: Insufficient documentation

## 2011-01-30 DIAGNOSIS — N138 Other obstructive and reflux uropathy: Secondary | ICD-10-CM

## 2011-01-30 LAB — BASIC METABOLIC PANEL
BUN: 21 mg/dL (ref 6–23)
CO2: 28 mEq/L (ref 19–32)
Chloride: 106 mEq/L (ref 96–112)
Creatinine, Ser: 1.3 mg/dL (ref 0.4–1.5)
Potassium: 4.5 mEq/L (ref 3.5–5.1)

## 2011-01-30 LAB — CBC WITH DIFFERENTIAL/PLATELET
Basophils Relative: 0.5 % (ref 0.0–3.0)
Eosinophils Relative: 3.7 % (ref 0.0–5.0)
Hemoglobin: 12 g/dL — ABNORMAL LOW (ref 13.0–17.0)
Lymphocytes Relative: 21.2 % (ref 12.0–46.0)
Monocytes Relative: 7.4 % (ref 3.0–12.0)
Neutro Abs: 3.3 10*3/uL (ref 1.4–7.7)
Neutrophils Relative %: 67.2 % (ref 43.0–77.0)
RBC: 3.91 Mil/uL — ABNORMAL LOW (ref 4.22–5.81)
WBC: 5 10*3/uL (ref 4.5–10.5)

## 2011-01-30 LAB — HEMOGLOBIN A1C: Hgb A1c MFr Bld: 8.4 % — ABNORMAL HIGH (ref 4.6–6.5)

## 2011-01-30 LAB — HEPATIC FUNCTION PANEL
AST: 12 U/L (ref 0–37)
Albumin: 3.1 g/dL — ABNORMAL LOW (ref 3.5–5.2)

## 2011-01-30 LAB — TSH: TSH: 1.14 u[IU]/mL (ref 0.35–5.50)

## 2011-01-30 LAB — LDL CHOLESTEROL, DIRECT: Direct LDL: 176.4 mg/dL

## 2011-01-30 LAB — POCT URINALYSIS DIPSTICK
Ketones, UA: NEGATIVE
Spec Grav, UA: 1.025
Urobilinogen, UA: 0.2

## 2011-01-30 LAB — MICROALBUMIN / CREATININE URINE RATIO
Creatinine,U: 145.1 mg/dL
Microalb Creat Ratio: 3.4 mg/g (ref 0.0–30.0)
Microalb, Ur: 4.9 mg/dL — ABNORMAL HIGH (ref 0.0–1.9)

## 2011-01-30 LAB — LIPID PANEL
Cholesterol: 238 mg/dL — ABNORMAL HIGH (ref 0–200)
VLDL: 29.2 mg/dL (ref 0.0–40.0)

## 2011-01-30 MED ORDER — RAMIPRIL 5 MG PO CAPS: 5.0000 mg | ORAL_CAPSULE | Freq: Every day | ORAL | Status: DC

## 2011-01-30 MED ORDER — INSULIN GLARGINE 100 UNIT/ML ~~LOC~~ SOLN: SUBCUTANEOUS | Status: DC

## 2011-01-30 MED ORDER — BECLOMETHASONE DIPROPIONATE 40 MCG/ACT IN AERS: 1.0000 | INHALATION_SPRAY | Freq: Two times a day (BID) | RESPIRATORY_TRACT | Status: DC

## 2011-01-30 MED ORDER — INSULIN LISPRO 100 UNIT/ML ~~LOC~~ SOLN: 10.0000 [IU] | Freq: Three times a day (TID) | SUBCUTANEOUS | Status: DC

## 2011-01-30 MED ORDER — INSULIN GLARGINE 100 UNIT/ML ~~LOC~~ SOLN: 15.0000 [IU] | Freq: Two times a day (BID) | SUBCUTANEOUS | Status: DC

## 2011-01-30 MED ORDER — INSULIN LISPRO 100 UNIT/ML ~~LOC~~ SOLN: SUBCUTANEOUS | Status: DC

## 2011-01-30 MED ORDER — SIMVASTATIN 40 MG PO TABS: 40.0000 mg | ORAL_TABLET | Freq: Every evening | ORAL | Status: DC

## 2011-01-30 NOTE — Progress Notes (Signed)
Addended by: Kern Reap on: 01/30/2011 10:41 AM   Modules accepted: Orders

## 2011-01-30 NOTE — Patient Instructions (Signed)
Restart the Qvar one puff twice daily.  Restart the Zocor 40 mg daily.  Continue the Altace 5 mg daily.  Lantus 10 units prior to bedtime and at the Humalog 5 units prior to each meal.  I will  Call, you when I get your lab work back

## 2011-01-30 NOTE — Progress Notes (Signed)
Subjective:    Patient ID: Dustin Vance, male    DOB: Mar 01, 1933, 77 y.o.   MRN: [id number]  HPIWilliam is a 77 year old, married male, who comes in today accompanied by his wife for evaluation of multiple issues.  Last summer.  He found a knot in the left side of his neck.  It turned out to be cancer.  He underwent radiation and chemotherapy.  He is now improving off therapy.  Able eat again.  He stop smoking.  Finally  His history of underlying, COPD he's not been taking his Qvar.  He has a history of insulin-dependent diabetes.  He takes 10 units of Lantus nightly 5 units of NovoLog prior to each meal.  Blood sugars range from 100 to 250 will check labs today.  He takes Ramapril 5 mg daily for hypertension.  BP 140/90.  He stopped his simvastatin 80 because he can't swallow.  He gets routine eye care, dental care, colonoscopy, tetanus, 2006, Pneumovax, x two, shingles.  Information given    Review of Systems  Constitutional: Negative.   HENT: Negative.  Negative for hearing loss.   Eyes: Negative.   Respiratory: Negative.   Cardiovascular: Negative.   Gastrointestinal: Negative.   Genitourinary: Negative.  Negative for urgency and decreased urine volume.  Musculoskeletal: Negative.   Skin: Negative.   Neurological: Negative.   Hematological: Negative.   Psychiatric/Behavioral: Negative.        Objective:   Physical Exam  Constitutional: He is oriented to person, place, and time. He appears well-developed and well-nourished.  HENT:  Head: Normocephalic and atraumatic.  Right Ear: External ear normal.  Left Ear: External ear normal.  Nose: Nose normal.  Mouth/Throat: Oropharynx is clear and moist.  Eyes: Conjunctivae and EOM are normal. Pupils are equal, round, and reactive to light.  Neck: Normal range of motion. Neck supple. No JVD present. No tracheal deviation present. No thyromegaly present.  Cardiovascular: Normal rate, regular rhythm, normal heart sounds  and intact distal pulses.  Exam reveals no gallop and no friction rub.   No murmur heard. Pulmonary/Chest: Effort normal. No stridor. No respiratory distress. He has wheezes. He has no rales. He exhibits no tenderness.  Abdominal: Soft. Bowel sounds are normal. He exhibits no distension and no mass. There is no tenderness. There is no rebound and no guarding.       Scar well healed in the left upper abdomen from feeding tube.  Last year and scar midline from AAA repair  Genitourinary: Rectum normal, prostate normal and penis normal. Guaiac negative stool. No penile tenderness.       Penile implant  Musculoskeletal: Normal range of motion. He exhibits no edema and no tenderness.  Lymphadenopathy:    He has no cervical adenopathy.  Neurological: He is alert and oriented to person, place, and time. He has normal reflexes. No cranial nerve deficit. He exhibits normal muscle tone.  Skin: Skin is warm and dry. No rash noted. No erythema. No pallor.  Psychiatric: He has a normal mood and affect. His behavior is normal. Judgment and thought content normal.          Assessment & Plan:  Type 1 diabetes continue medications check labs.  Hypertension.  Continue Altace 5 mg daily.  Hyperlipidemia decrease simvastatin 40 mg daily.  COPD restart Qvar 40 does one puff b.i.d.  History of her cancer June 2011 successful treatment with radiation and chemo.  Next smoker.  History of penile implant.  History of AAA repair

## 2011-01-31 NOTE — Progress Notes (Signed)
Spoke with patient's wife.

## 2011-02-27 NOTE — Procedures (Signed)
BYPASS GRAFT EVALUATION   INDICATION:  Followup of right lower extremity bypass graft.  The  patient denies bilateral claudication and rest pain.   HISTORY:  Diabetes:  Yes.  Cardiac:  No.  Hypertension:  Yes.  Smoking:  Yes, one pack per day.  Previous Surgery:  Right SFA-BK POP BPG with reversed saphenous vein on  05/21/2003 by Dr. Edilia Bo.  AAA repair in 2005.   SINGLE LEVEL ARTERIAL EXAM                               RIGHT              LEFT  Brachial:                    110                120  Anterior tibial:             142                146  Posterior tibial:            148 (1.23)         160 (1.33)  Peroneal:  Ankle/brachial index:        1.23               1.33   PREVIOUS ABI:  Date:  08/02/2004  RIGHT:  >1.0  LEFT:  >1.0   LOWER EXTREMITY BYPASS GRAFT DUPLEX EXAM:   DUPLEX:  Doppler arterial waveforms are triphasic proximal to, within  and distal to the graft.   IMPRESSION:  1. Erroneous bilateral ABIs which may be due to medial arterial      calcification, however, biphasic waveforms are demonstrated at the      right DPA and PTA and triphasic at the left DPA and PTA.  2. Patent right SFA-BK POP BPG.   ___________________________________________  Di Kindle. Edilia Bo, M.D.   PB/MEDQ  D:  05/26/2008  T:  05/26/2008  Job:  [id number]

## 2011-03-02 NOTE — Op Note (Signed)
[facility]  Patient:    Dustin Vance, Dustin Vance                     MRN: [id number] Proc. Date: 01/13/01 Adm. Date:  [id number] Attending:  Laqueta Jean CC:         Dr. _____________, [facility]   Operative Report  PREOPERATIVE DIAGNOSES: 1. Peyronies disease. 2. Diabetes.  POSTOPERATIVE DIAGNOSES: 1. Peyronies disease. 2. Diabetes.  OPERATION:  Implantation of Mentor Alpha 1 inflatable penile prosthesis with intraoperative molding (6 minutes).  SURGEON:  Sigmund I. Patsi Sears, M.D.  ANESTHESIA:  General endotracheal.  PREPARATION:  After appropriate preanesthesia, the patient is brought to the operating room and placed on the operating table in a dorsal supine position where general endotracheal anesthesia was introduced.  The pubis was then shaven, and the patient was given a 10 minute Betadine scrub, prior to prep and drape.  DESCRIPTION OF PROCEDURE:  Following this, and following intravenous Ancef and gentamycin, a semilinear incision is made at the base of the penis, and the subcutaneous tissue was dissected with the electrosurgical unit.  The corpora cavernosum were identified bilaterally, and bilateral 2-0 Vicryl sutures were used as placement sutures, and coporotomies were made.  The corpora were dilated bilaterally.  The corpora were fibrotic, and it was quite difficult to dilate, particularly in the left side, and particularly proximally.  However, there was also scarring noted distalward, particularly on the left side.  It was impossible to get a size 12 dilator proximalward, or good dilation on either side distalward.  The corpora was fibrotic and did not bleed at all.  It was decided to use the narrow Mentor prostheses, and these were placed.  I decided to use a size 15 prosthesis with a 1 cm rear tip extensor. While 2 cm rear tip extensor was used initially, because of the severe curvature, it was  elected to go back and place a 1 cm rear tip, and the patient had better corporal distension.  However, there was still a problem with Peyronies torquing of the penis, and the patient underwent a total of six minutes of molding of the penis, both prior to closure of the corpora cavernosum and then after closure of the corpora cavernosum.  Once the corpora cavernosum were closed with running and interrupted 2-0 Vicryl sutures, antibiotic irrigation was used.  The scrotal pump was placed.  Attention was directed to the left lower abdomen.  The patient had a large right inguinal hernia incision with a large amount of scarring.  Therefore, it was elected to place the reservoir in the left subfascial area, and the abdominal wall fascia was dissected on the left side, and the rectus muscle was dissected.  The 60 cc pump was placed with 50 cc within it, and the 50 cc were inflated.  The fascia was then closed over this.  Following this, connections were then made to the pump with using a straight connector.  This prosthesis was cycled well after a final molding was accomplished.  The patient was given a B&O suppository, Marcaine in the wound, and was given 30 mg of IV Toradol prior to awakening.  He was then awakened and taken to the recovery room in good condition. DD:  01/13/01 TD:  01/14/01 Job: [id number]

## 2011-03-02 NOTE — Op Note (Signed)
NAME:  Dustin Vance, Dustin Vance NO.:  [id number]   MEDICAL RECORD NO.:  [id number]                   PATIENT TYPE:  OIB   LOCATION:  2872                                 FACILITY:  [facility]   PHYSICIAN:  Di Kindle. Edilia Bo, M.D.        DATE OF BIRTH:  05-21-1933   DATE OF PROCEDURE:  05/10/2003  DATE OF DISCHARGE:                                 OPERATIVE REPORT   PREOPERATIVE DIAGNOSIS:   POSTOPERATIVE DIAGNOSIS:   OPERATION PERFORMED:  1. Aortogram.  2. Bilateral iliac arteriogram.  3. Bilateral lower extremity runoff.   SURGEON:  Di Kindle. Edilia Bo, M.D.   ANESTHESIA:  Local with sedation.   INDICATIONS FOR PROCEDURE:  The patient is a 77 year old gentleman who was  noted to have some right lower extremity swelling.  This prompted a duplex  scan which showed a 3 cm right popliteal artery aneurysm.  He was brought in  for diagnostic arteriography in order to plan elective repair of his  aneurysm.  Of note, we also performed an ultrasound of his abdomen in the  office and he was found to have a 4.8 cm infrarenal abdominal aortic  aneurysm.  We planned to follow this at six month intervals and consider  repair if it enlarges to greater than 5 cm.  The procedure and potential  complications not limited to bleeding, arterial injury, dye reaction, and  kidney failure were all discussed with the patient preoperatively.  All of  his questions were answered and he was agreeable to proceed.   DESCRIPTION OF PROCEDURE:  The patient was taken to the peripheral vascular  lab at [facility] and sedated with 1 mg of Versed and 15 mcg of  fentanyl.  Both groins were prepped and draped in the usual sterile fashion.  After the skin was infiltrated with 1% lidocaine, the right common femoral  artery was cannulated and a guidewire introduced into the infrarenal aorta  under fluoroscopic control.  Next, a 5 French sheath was passed over the  wire and then  the dilator was removed.  The pig tail catheter was positioned  at the L1 vertebral body and flush aortogram obtained.  The catheter was  then repositioned above the aortic bifurcation and oblique iliac projections  were obtained.  An additional lateral projection of the aorta was obtained.  Bilateral lower extremity runoff films were obtained.   FINDINGS:  There were single renal arteries bilaterally with no significant  renal artery stenosis identified.  The infrarenal neck measures  approximately 3 cm using the marker catheter.  The aneurysm ends at the  bifurcation of the aorta and both common iliac arteries were widely patent  without evidence of aneurysmal disease.  The hypogastric arteries were  patent bilaterally and both external iliac arteries are widely patent.  There is slight tortuosity of the iliac arteries.   Lateral projection demonstrates that the celiac and superior mesenteric  arteries were widely patent.  Again, there appears to be approximately a 3  to 3.5 cm infrarenal neck.   Next, bilateral lower extremity runoff films were obtained.  The common  femorals, superficial femorals and deep femoral arteries were all widely  patent bilaterally.  There was a complex right popliteal artery aneurysm.  The below-knee popliteal artery segment appears to be normal.  There was  three-vessel runoff bilaterally.  There was no aneurysm noted on the left  side.   FINDINGS:  1. Abdominal aortic aneurysm as described above with a nice 3 cm proximal     neck.  No significant iliac artery aneurysms.  2. A right popliteal artery aneurysm as described above.                                               Di Kindle. Edilia Bo, M.D.    CSD/MEDQ  D:  05/10/2003  T:  05/10/2003  Job:  [id number]   cc:   Alfonse Alpers. Dagoberto Ligas, M.D.  [address]  [place]  [place] [address]  Fax: [phone]   Peripheral vascular lab

## 2011-03-02 NOTE — Discharge Summary (Signed)
NAME:  Dustin Vance, Dustin Vance NO.:  [id number]   MEDICAL RECORD NO.:  [id number]                   PATIENT TYPE:  INP   LOCATION:  2017                                 FACILITY:  [facility]   PHYSICIAN:  Di Kindle. Edilia Bo, M.D.        DATE OF BIRTH:  [birth year]   DATE OF ADMISSION:  DATE OF DISCHARGE:  05/24/2003                                 DISCHARGE SUMMARY   ADMISSION DIAGNOSES:  1. Right popliteal aneurysm.  2. Insulin-dependent type 2 adult-onset diabetes mellitus.  3. Coronary artery disease with prior history of myocardial infarction and     congestive heart failure.  4. On-going tobacco use.  5. Dyslipidemia.   DISCHARGE DIAGNOSES:  1. Right popliteal aneurysm.  2. Insulin-dependent type 2 adult-onset diabetes mellitus.  3. Coronary artery disease with prior history of myocardial infarction and     congestive heart failure.  4. On-going tobacco use.  5. Dyslipidemia.   PROCEDURES:  Right popliteal aneurysm repair with reversed autogenous  saphenous vein, ligation of proximal popliteal, intraoperative arteriogram  May 21, 2003.   BRIEF HISTORY:  The patient is a 77 year old white male, medical patient of  Dr. Corrin Vance, who had developed a sore on his right ankle, which lead  to a duplex to rule out DVT.  Incidental finding included a popliteal  aneurysm which was 3 cm in diameter.  The patient then underwent further  evaluation and subsequently, came for admission for repair of his popliteal  aneurysm.   He has a history of diabetes, coronary artery disease, on-going tobacco use  and dyslipidemia.   MEDICATIONS ON ADMISSION:  1. Humalog 10 units at breakfast, 10 units with lunch, 16 units with supper.  2. Lantus insulin 50 units q.h.s. subcutaneous.  3. Zocor 80 mg daily.  4. Zetia 10 mg daily h.s.  5. Multivitamins 1 daily.  6. Aspirin 325 mg 1 daily.   ALLERGIES:  None known.   HISTORY AND PHYSICAL:  For further history  and physical, please see the  dictated note.   HOSPITAL COURSE:  The patient was admitted and underwent the procedures as  described above on the day of admission.  He tolerated the procedure well  and returned to the recovery room in satisfactory condition.  He has done  well. The first postop day, hemoglobin was 11, hematocrit 34, white count  10,000, platelets 220,000, electrolytes normal, BUN 13, creatinine 1.3,  glucose 250.  His chest exam showed some rhonchi, along with some rales.  Incentive spirometer was encouraged. He had 2+ DP and PT pulses.  His  incisions looked good and there has been minimal drainage from the JP.  JP  was left in an additional day and on May 23, 2003, he continued to have 3+  DP pulses.  JP drainage was minimal and Dr. Hart Vance ordered they be removed.  The patient was mobilized in anticipation of possible discharge in the a.m.  May 24, 2003.   DISCHARGE ACTIVITIES:  Light to moderate, no lifting heavier than 10 pounds,  no driving, no strenuous activity.  He was to keep his leg elevated when not  walking.   DISCHARGE MEDICATIONS:  He will resume his preadmission medicines as before.  Currently, he is on 30 units of Humalog insulin. We will send him home and  we will let him adjust that as his sugars indicate.  The patient is very  capable and used to regulating his own sugars.   LABORATORY DATA:  His final labs showed white count to be normal, creatinine  1.3, white count 10, hemoglobin 11.5, hematocrit 34.2, platelets 220,000.   CONDITION ON DISCHARGE:  Improving.      Dustin Vance, P.A.                 Di Kindle. Edilia Bo, M.D.    WDJ/MEDQ  D:  05/23/2003  T:  05/23/2003  Job:  [id number]   cc:   Dustin Vance, M.D.  [address], [address]  [place]  [place] [address]  Fax: [phone]

## 2011-03-02 NOTE — Op Note (Signed)
NAME:  Dustin Vance, Dustin Vance NO.:  [id number]   MEDICAL RECORD NO.:  [id number]                   PATIENT TYPE:  INP   LOCATION:  2308                                 FACILITY:  [facility]   PHYSICIAN:  Lorre Munroe., M.D.            DATE OF BIRTH:  22-Oct-1932   DATE OF PROCEDURE:  01/18/2004  DATE OF DISCHARGE:                                 OPERATIVE REPORT   PREOPERATIVE DIAGNOSIS:  Tumor of the duodenum.   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   OPERATION:  Biopsy of duodenal tumor.   CLINICAL NOTE:  I was asked to come to the operating room by Dr. Edilia Bo who  had discovered a mass in the area of the duodenum as it emerged from the  ligament of Treitz while he was freeing up the duodenum in preparation for  resection and grafting of an abdominal aortic aneurysm.  I scrubbed in and  found that the duodenum was well mobilized and there there was a discrete,  firm, but not hard mass approximately 3 cm in greatest dimension in the  lateral duodenal wall well away from the mesentery in the retroperitoneal  area of the duodenum just as the jejunum emerged.  I explored the abdomen  finding no abnormalities of the liver, stomach, other parts of the duodenum,  small bowel or colon, or peritoneal surfaces.  I took a small wedge biopsy  of the tumor taking care not to go full thickness of the duodenum.  Hemostasis was obtained with cautery.  I then closed the wound with  interrupted 2-0 silk sutures which provided a good secure closure of the  wound.  Hemostasis was good.  Frozen section report of benign process as  mentioned above was obtained.  The patient remained in the operating room.  After a discussion with Dr. Edilia Bo, it was decided that he would go ahead  with resection and grafting of the aneurysm.  I felt that full thickness  resection of this mass was not indicated but that endoscopy to  try to  characterize it further might subsequently be warranted.                                               Lorre Munroe., M.D.    WB/MEDQ  D:  01/18/2004  T:  01/18/2004  Job:  [id number]   cc:   Di Kindle. Edilia Bo, M.D.  [address]  [place]  [place] [address]

## 2011-03-02 NOTE — Discharge Summary (Signed)
NAME:  Dustin Vance, MCAFFEE NO.:  [id number]   MEDICAL RECORD NO.:  [id number]                   PATIENT TYPE:  INP   LOCATION:  2034                                 FACILITY:  [facility]   PHYSICIAN:  Di Kindle. Edilia Bo, M.D.        DATE OF BIRTH:  [birth year]   DATE OF ADMISSION:  01/18/2004  DATE OF DISCHARGE:  01/24/2004                                 DISCHARGE SUMMARY   ADMISSION DIAGNOSIS:  A 75 cm infrarenal abdominal aortic aneurysm.   PAST MEDICAL HISTORY:  1. Diabetes mellitus type 2 now insulin dependent.  2. Status post repair of 3 cm right popliteal artery aneurysm August 2004 by     Dr. Edilia Bo.  3. Right inguinal hernia repair 1994 and 1997.  4. Penile implant 2000.  5. Appendectomy.   ALLERGIES:  No known drug allergies.   DISCHARGE DIAGNOSES:  1. Abdominal aortic aneurysm 5.3 cm, status post resection and grafting.  2. Benign tumor of duodenal wall.   BRIEF HISTORY:  Mr. Craddock is a 77 year old Caucasian male. At the time of  his repair of his right popliteal artery aneurysm he was noted on exam to  have an abdominal aortic aneurysm. At that time it was less than 5 cm in  size. On return for routine follow-up, March 2005, his aneurysm had enlarged  from 4.8 cm to 5.3 cm, this was over six months. For this reason Dr. Edilia Bo  recommended elective repair. Dr. Edilia Bo initially thought he was a  candidate for an endovascular repair, however, on further examination with a  CT scan there appeared to be significant laminated thrombus in the neck of  the aneurysm, this precluded endovascular repair. The procedure of open  abdominal aortic aneurysm repair, the risks and benefits were all discussed  with Mr. Cybulski and he agreed to proceed with surgery.   HOSPITAL COURSE:  On January 18, 2004 Mr. Vondrak was electively admitted to  [facility] under the care of Dr. Waverly Ferrari. He underwent  the following surgical procedure:   Repair of abdominal aortic aneurysm with  an 18 mm straight Hemashield graft. At the time of his abdominal exploration  Dr. Edilia Bo noted a small, duodenal tumor. General surgery consultation was  requested and Dr. Orson Slick examined Mr. Weekley duodenum. He felt the best  course was to take a small biopsy of this. This was done and frozen section  pathology revealed this to be benign. 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 There is no  evidence of malignancy identified. Mr. Kimball tolerated these procedures  well transferring in stable condition to the PACU. He was extubated  immediately following surgery and awoke from anesthesia neurologically  intact. He remained hemodynamically stable in the immediate postoperative  period. Mr. Visconti postoperative course has been uneventful. His bowel  function has returned and his diet has been advanced slowly. The morning of  January 22, 2004, postoperative day four he was started on the clear liquid  diet, advanced to a regular diet later in the day. He is tolerating this  well.   January 23, 2004 is postoperative day five and Mr. Parkerson reports feeling very  well. His lungs do have a few bibasilar crackles. His abdomen is with bowel  sounds and only slightly distended. His incision is healing well. His  bilateral feet are with palpable pulses. He is ambulating in the hall with  minimal assistance. His pain is adequately controlled. Mr. Szabo is  continuing to make good progress in recovering from his surgery. His  postoperative CBGs have been elevated. His home insulin doses have been  restarrted. If Mr. Losito continues to progress in this manner it is  anticipated that he will be ready for discharge home tomorrow, January 24, 2004.   RECENT LABORATORY STUDIES:  April 8 CBC white blood cells 14.2, hemoglobin  11.4, hematocrit 33.4, platelets 185. Chemistries included sodium 136,   potassium 3.7, BUN 18, creatinine 1.4, glucose 180.   CONDITION ON DISCHARGE:  Improved.   DISCHARGE INSTRUCTIONS:   DISCHARGE MEDICATIONS:  1. Pepcid 20 mg b.i.d.  2. Nicoderm CQ 14 mg patch to be changed daily.   Continue his home medications of:  1. Zocor 80 mg daily.  2. Aspirin 81 mg daily.  3. Insulin Humalog subcu breakfast and lunch, 16 units subcu at dinner, and     Lantus insulin 45 units subcu at bedtime.   For pain management he may have Tylenol 1-2 p.o. q.4h. p.r.n.   ACTIVITY:  He has been asked to refrain from any driving or any heavy  lifting, pushing, or pulling. Also instructed to continue his breathing  exercises and daily walking. He has been advised to stop smoking.   DIET:  As tolerated.   WOUND CARE:  He is to shower daily with mild soap and water. If his  incisions show any sign of infection or if has a fever of greater than 101  degrees Fahrenheit then he should call Dr. Adele Dan office.   DISCHARGE FOLLOW UP:  He has an appointment at the [facility] office to see the  registered nurse for skin sample removal on Monday, April 18 at 2 a.m.   Dr. Edilia Bo would like to see him at the [facility] office on Wednesday, April 11  at 10 a.m.      Toribio Harbour, N.P.                  Di Kindle. Edilia Bo, M.D.    CTK/MEDQ  D:  01/23/2004  T:  01/24/2004  Job:  [id number]   cc:   Di Kindle. Edilia Bo, M.D.  [address]  [place]  [place] [address]   Alfonse Alpers. Dagoberto Ligas, M.D.  [address], [address]  [place]  [place] [address]  Fax: [phone]   Eugenio Hoes. Tawanna Cooler, M.D. [facility]

## 2011-03-02 NOTE — Discharge Summary (Signed)
   NAME:  Dustin Vance, Dustin Vance                        ACCOUNT NO.:  [id number]   MEDICAL RECORD NO.:  [id number]                   PATIENT TYPE:  INP   LOCATION:  3707                                 FACILITY:  [facility]   PHYSICIAN:  Corinna L. Lendell Caprice, MD             DATE OF BIRTH:  1933/08/09   DATE OF ADMISSION:  05/24/2003  DATE OF DISCHARGE:  05/25/2003                                 DISCHARGE SUMMARY   DIAGNOSES:  1. Syncope, suspect transient hypotension.  2. Recent femoral popliteal bypass.  3. Hyperlipidemia.  4. Diabetes.  5. Coronary artery disease.  6. Tobacco abuse.   DISCHARGE MEDICATIONS:  The same as upon admission - please see H&P for  details.   CONDITION ON DISCHARGE:  Stable.   DIET:  Diabetic.   ACTIVITY:  Ad lib.   CONSULTATIONS:  Dr. Edilia Bo.   PERTINENT LABORATORIES:  Myocardial infarction ruled out by serial cardiac  enzymes.   SPECIAL STUDIES AND RADIOLOGY:  EKG showed normal sinus rhythm with first  degree AV block, suspect right bundle branch block.  CBC was significant for  a hemoglobin of 11.1, hematocrit of 32.2, D-dimer 0.89.   HISTORY AND HOSPITAL COURSE:  Briefly, [name] is a 77 year old white  male who had just been discharged from Dr. Adele Dan service the day of  admission with complaints of syncope.  He had a fem/pop bypass and passed  out twice momentarily.  He was admitted and ruled out for myocardial  infarction.  His vital signs  remained stable, and he was ambulating fine.  Dr. Edilia Bo also saw the  patient while in the hospital and agreed with discharge.  I suspect he may  have been vasovagal or possibly transiently hypotensive.  He was not  hypoglycemic at the time of the syncope.  He is to follow up with Dr.  Edilia Bo tomorrow.                                                Corinna L. Lendell Caprice, MD    CLS/MEDQ  D:  06/01/2003  T:  06/02/2003  Job:  [id number]   cc:   Di Kindle. Edilia Bo, M.D.  [address]  [place] [employer]  [place] [address]  Fax: [phone]

## 2011-03-02 NOTE — Procedures (Signed)
[place]  Patient:    Dustin Vance, Dustin Vance Visit Number: [id number] MRN: [id number]          Service Type: END Location: ENDO Attending Physician:  Nelda Marseille Dictated by:   Petra Kuba, M.D. Proc. Date: 02/09/02 Admit Date:  02/09/2002   CC:         Evette Georges, M.D. [facility]: Lambert Rhonda W   Procedure Report  PROCEDURE:  Colonoscopy with polypectomy.  INDICATIONS:  Patient with a history of colon polyps, due for repeat screening.  PROCEDURE:  Consent was signed after risks, benefits, methods, and options were thoroughly discussed multiple times in the past.  MEDICATIONS USED:  Demerol 60 and Versed 5.  PROCEDURE:  Rectal inspection was pertinent for external hemorrhoids.  Digital exam was negative.  Video colonoscope was inserted and easily advanced around the colon to the anastomosis, which was identified by the colon and small bowel side.  Two small bowel openings were seen.  Unfortunately, due to looping, could not advance into either of them.  One seemed to be a very short, blind pouch, and the other seemed to be the lumen.  No obvious abnormality was seen on insertion.  The scope was slowly withdrawn.  The prep was adequate.  there was some liquid stool that required washing and suctioning.  On slow withdrawal through the colon, there was a rare, left-sided diverticula and two tiny rectal and sigmoid polyps, which were hot biopsied x1 and put in the same container; otherwise, no other polypoid lesions or abnormalities were seen as we slowly withdrew back to the rectum. Once back in the rectum, the scope was retroflexed, pertinent for some internal hemorrhoids.  The scope was drained and readvanced a short ways around the left side of the colon.  Air was suctioned and the scope removed. The patient tolerated the procedure well.  There was no obvious immediate complication.  ENDOSCOPIC DIAGNOSES: 1. Internal and external small  hemorrhoids. 2. Rare left-sided diverticula. 3. Two tiny rectosigmoid polyps, hot biopsied. 4. Otherwise within normal limits to the anastomosis, which seemed to be the colon and small bowel side.  PLAN:  Await pathology.  Will probably recheck colon screening in five years. Return care to Dr. Tawanna Cooler for the customary healthcare maintenance to include yearly rectals and guaiacs, and otherwise, I would be happy to see back p.r.n. Dictated by:   Petra Kuba, M.D. Attending Physician:  Nelda Marseille DD:  02/09/02 TD:  02/09/02 Job: [id number]

## 2011-03-02 NOTE — Op Note (Signed)
NAME:  Dustin Vance, Dustin Vance NO.:  [id number]   MEDICAL RECORD NO.:  [id number]                   PATIENT TYPE:  INP   LOCATION:  2899                                 FACILITY:  [facility]   PHYSICIAN:  Di Kindle. Edilia Bo, M.D.        DATE OF BIRTH:  [birth year]   DATE OF PROCEDURE:  01/18/2004  DATE OF DISCHARGE:                                 OPERATIVE REPORT   PREOPERATIVE DIAGNOSIS:  5.3 cm infrarenal abdominal aortic aneurysm.   POSTOPERATIVE DIAGNOSIS:  5.3 cm infrarenal abdominal aortic aneurysm with  duodenal mass.   PROCEDURE:  1. Repair of abdominal aortic aneurysm with 18 mm tube graft.  2. Duodenal mass biopsy done by Dr. Lebron Conners.  3. Lysis of adhesions.   SURGEON:  Di Kindle. Edilia Bo, M.D.   ASSISTANT:  Toribio Harbour, N.P.   ANESTHESIA:  General.   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  On  exam, he was found to have an abdominal aortic aneurysm which was initially  4.7 cm, this enlarged to 5.3 cm, and elective repair was recommended given  the risk of rupture.  I did not think he was a candidate for endovascular  repair because he had laminated thrombus in the proximal aorta.  The  procedure and potential complications were discussed with the patient in  detail preoperatively.  All of his questions were answered and he was  agreeable to proceed.   SURGICAL TECHNIQUE:  The patient was taken to the operating room after a  Swan-Ganz catheter and arterial line were placed by anesthesia.  The  abdomen, groins, and thighs were prepped and draped in the usual sterile  fashion.  The abdomen was entered through a midline incision.  Upon  exploration, the only intra-abdominal pathology that I noted was a mass  about the size of a quarter which was soft and involved the superficial  surface of the duodenum.  I consulted Dr. Orson Slick  interoperatively and he did  a biopsy of this and the frozen section showed that this was benign and it  was likely a pancreatic rest.  Permanent section was pending.   Next, the transverse colon was reflected superiorly and the small bowel  reflected to the patient's right.  The retroperitoneal tissue was divided.  The aorta was dissected free up to the level of the renal vein.  The common  iliac arteries were soft and these were exposed such that I could clamp  them.  The patient was then heparinized and received 25 grams of Mannitol.  The common iliac arteries were clamped and then the infrarenal aorta was  clamped.  The IMA had been ligated.  The aneurysm sac was opened and the  lumbars were oversewn with 2-0 silk ties.  An 18 mm graft was selected and  this was sewn end-to-end to the infrarenal aorta using  a felt cuff using  continuous 3-0 Prolene suture.  The proximal anastomosis was then tested and  was hemostatic and it was flushed appropriately and reclamped.  The graft  was then pulled to the appropriate length for anastomosis to the aortic  bifurcation.  This was done with continuous 3-0 Prolene suture.  Prior to  completing this anastomosis, the vessels back bled and flushed  appropriately, and the anastomosis completed.  Flow was re-established to  the legs which the patient tolerated from a hemodynamic standpoint.  Hemostasis was obtained in the wounds and the heparin was partially reversed  with protamine.  The retroperitoneal tissue was then closed with running 2-0  Vicryl after the aneurysm was closed over the graft with running 2-0 Vicryl.  The abdominal contents were returned to their normal position.  Of note, the  small bowel had extensive adhesions in the right lower quadrant and these  were lysed to free up the small bowel as I thought he was at risk for  postoperative bowel obstruction if this was left where it was kinked into  the right lower quadrant.  The  abdominal contents were then returned to  their normal position.  The fascial layer was closed with two #1 PDS  sutures.  The subcutaneous tissue was closed with 3-0 Vicryl and the skin  was closed with staples.  A sterile dressing was applied.  The patient  tolerated the procedure well and was transferred to the recovery room in  satisfactory condition.  All needle and sponge counts were correct.                                               Di Kindle. Edilia Bo, M.D.    CSD/MEDQ  D:  01/18/2004  T:  01/18/2004  Job:  [id number]   cc:   Alfonse Alpers. Dagoberto Ligas, M.D.  [address], [address]  [place]  [place] [address]  Fax: [phone]   Sigmund I. Patsi Sears, M.D.  [address]  [place]  [place] [address]  Fax: [phone]   [facility]

## 2011-03-02 NOTE — H&P (Signed)
NAME:  Dustin Vance, Dustin Vance NO.:  [id number]   MEDICAL RECORD NO.:  [id number]                   PATIENT TYPE:  INP   LOCATION:                                       FACILITY:  [facility]   PHYSICIAN:  Di Kindle. Edilia Bo, M.D.        DATE OF BIRTH:  1933-01-14   DATE OF ADMISSION:  01/18/2004  DATE OF DISCHARGE:                                HISTORY & PHYSICAL   REASON FOR ADMISSION:  A 5.3 cm abdominal aortic aneurysm.   HISTORY:  This is a 77 year old gentleman who had presented in July 2004  with right leg swelling.  This prompted a duplex scan and an incidental  finding was a 3 cm right popliteal artery aneurysm.  Given the risk for  thrombosis and limb loss, he underwent elective repair of this on May 21, 2003.  He was also noted on exam to have an abdominal aortic aneurysm which  at the time was less than 5 cm in size.  He returned for a routine followup  visit earlier this month and the aneurysm had enlarged from 4.8 to 5.3 cm  over 6 months.  For this reason, elective repair was recommended.  I  reviewed his previous arteriogram and it looked like he might potentially be  a candidate for endovascular repair as there appeared to be an adequate  length neck and good attachment sites distally.  However, in reviewing his  CAT scan which he had done when he was last seen in the office on December 29, 2003 it appears that there is significant laminated thrombus in the neck and  therefore, I did not recommend endovascular repair.  In addition the patient  has a reservoir for a penile prosthesis in the left groin which I thought  might be another reason not to consider endovascular repair given that he  would require an incision in this area.  He is brought in for elective open  repair of his aneurysm.  He understands the risk of rupture without repair  is probably 5-10% per year.   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  This is type 2 diabetes.  He denies any history  of hypertension, hypercholesterolemia, history of previous myocardial  infarction or history of congestive heart failure.   FAMILY HISTORY:  There is no history of premature cardiovascular disease and  no history of abdominal aortic aneurysms that he is aware of.  His father  had a stroke in his 41s.   SOCIAL HISTORY:  He is married and has three children.  He works as a  Estate agent.  He smokes one pack per day of cigarettes and has been  smoking for 40 years.  He does not drink alcohol on a regular basis.   MEDICATIONS:  Zocor 80 mg p.o. once daily.   ALLERGIES:  No known drug allergies.   REVIEW  OF SYSTEMS:  The patient has had some slight weight gain recently.  He denies any loss of appetite, fever, or chills.  CARDIAC:  He has had no  chest pain, chest pressure, orthopnea, palpitations, or arrhythmias.  He  does have a history of dyspnea on exertion.  PULMONARY:  He has had no  bronchitis, asthma, or wheezing.  GI:  He has had no change in his bowel  habits and has no history of peptic ulcer disease or hiatal hernia.  GU:  He  does have some frequency of urination but no dysuria.  VASCULAR:  The  patient has had no claudication, rest pain, or nonhealing ulcers, he has had  no history of stroke, TIAs, or amaurosis fugax, he has had no DVT or  phlebitis.  NEUROLOGIC:  He has had no problems with dizziness, blackouts,  headaches, or seizures.  ORTHOPEDIC:  He has had no arthritis or joint pain.  He has had no muscle pain or rash.  PSYCHIATRIC:  He has had no depression  or nervousness.  HEMATOLOGIC:  He has had no bleeding problems or clotting  disorders.   PHYSICAL EXAMINATION:  Blood pressure is 140/70, heart rate is 100.  I do  not detect any carotid bruits.  Lungs are clear bilaterally to auscultation.  On cardiac exam he has a regular rate and rhythm.  His abdomen is soft and  nontender.  His  aneurysm is palpable and nontender.  He has palpable  femoral, popliteal, and pedal pulses bilaterally.  He has a reservoir for  his penile prosthesis in the left groin.  He has no significant lower  extremity swelling.  Neurologic exam is nonfocal.   IMPRESSION:  This patient presents with an enlarging aneurysm which is now  greater than 5 cm in diameter.  The risk of rupture of this is 5-10% per  year.  For this reason, I have recommended elective repair.  I did not think  he was a good candidate for endovascular repair as I thought that the  reservoir for his prosthesis would be in the way of the left groin incision  and in addition he had significant laminated thrombus in the proximal neck  and I think he would be at risk for graft migration and endoleak or  continued aneurysm expansion for this reason.  Therefore, elective repair is  recommended.  We have discussed the indications for the procedure and the  potential complications including but not limited to bleeding, wound healing  problems, renal failure, myocardial infarction, graft infection, graft  thrombosis, or other unpredictable medical problems.  All of his questions  were answered and he is agreeable to proceed.                                                Di Kindle. Edilia Bo, M.D.    CSD/MEDQ  D:  12/29/2003  T:  12/29/2003  Job:  [id number]   cc:   Lynelle Smoke I. Patsi Sears, M.D.  [address]  [place]  [place] [address]  Fax: [phone]   Alfonse Alpers. Dagoberto Ligas, M.D.  [address], [address]  [place]  [place] [address]  Fax: [phone]

## 2011-03-02 NOTE — Op Note (Signed)
NAME:  Dustin Vance, Dustin Vance NO.:  [id number]   MEDICAL RECORD NO.:  [id number]                   PATIENT TYPE:  INP   LOCATION:  3306                                 FACILITY:  [facility]   PHYSICIAN:  Di Kindle. Edilia Bo, M.D.        DATE OF BIRTH:  [birth year]   DATE OF PROCEDURE:  05/21/2003  DATE OF DISCHARGE:                                 OPERATIVE REPORT   PREOPERATIVE DIAGNOSIS:  Right popliteal artery aneurysm.   POSTOPERATIVE DIAGNOSIS:  Right popliteal artery aneurysm.   PROCEDURE:  Repair of right popliteal artery aneurysm with right superficial  femoral artery to below knee popliteal artery bypass graft with a reversed  saphenous vein graft and an interoperative arteriogram.   SURGEON:  Di Kindle. Edilia Bo, M.D.   ASSISTANT:  Janetta Hora. Darrick Penna, M.D.  [name], P.A.   ANESTHESIA:  General.   INDICATIONS FOR PROCEDURE:  This is a 77 year old gentleman who presented  with right leg swelling.  This prompted a duplex scan and an incidental  finding was a 3 cm right popliteal artery aneurysm.  Given the size of the  aneurysm with the risks for thrombosis and embolization, repair was  recommended in order to decrease his risk of limb loss.  The procedure and  potential complications including but not limited to bleeding, wound  problems, lymphocele, graft thrombosis, and limb loss were discussed with  the patient preoperatively.  All his questions were answered and he was  agreeable to proceed.   SURGICAL TECHNIQUE:  The patient was taken to the operating room, received a  general anesthetic.  The entire right lower extremity was prepped and draped  in the usual sterile fashion.  After the leg had been prepped and draped, a  longitudinal incision was made over the saphenous vein below the knee and  the saphenous vein here of note had split and both of these branches were  dissected out and neither appeared significantly larger  than the other  branch.  Through the same incision, the below knee popliteal artery was  exposed.  The artery was soft.  A separate incision was made over the  saphenous vein above the knee where the saphenous vein here was  significantly larger.  Branches were divided between clips and 3-0 silk  ties.  Through the same incision, the above knee popliteal artery was  exposed.  A tunnel was created between the two incisions medial to the  aneurysm.  The patient was then heparinized.  The vein was then removed in  its entirety.  It was gently distended up with heparinized saline and it was  a good size vein.  It was decided to use this in a reversed fashion as there  was no significant tapering of the vein.  A tourniquet was placed on the  upper thigh, the leg was exsanguinated with an Esmarch bandage, and the  tourniquet inflated to 250 mmHg.  Under tourniquet  control, the above knee  popliteal artery was divided and distally the artery was oversewn with a 5-0  Prolene suture.  Proximally, the artery was spatulated.  The vein was  spatulated and sewn end-to-end to the artery using two continuous 6-0  Prolene sutures.  The graft was then brought through the tunnel for  anastomosis to the below knee popliteal artery.  Here, the artery was again  divided.  Proximally, it was ligated with 2-0 silk tie.  Distally, it was  spatulated.  The graft was cut to the appropriate length, spatulated, and  sewn end-to-end to the artery using two continuous 6-0 Prolene sutures.  Prior to completing the anastomosis, the tourniquet was released, the  vessels back bled and flushed appropriately, and the anastomosis completed.  Flow was re-established to the leg.  Interoperative arteriogram was obtained  which showed no technical problems and three vessel run off.  Next, the  heparin was partially reversed with protamine.  The wounds were then  irrigated and hemostasis obtained.  Two 15 Blake drains were placed.   The  wounds were closed with a deep layer of 2-0 Vicryl, subcutaneous layer of 3-  0 Vicryl, and the skin was closed with staples.  A sterile dressing was  applied.  The patient tolerated the procedure well and had a palpable  dorsalis pedes pulse at the completion of the procedure.                                               Di Kindle. Edilia Bo, M.D.    CSD/MEDQ  D:  05/21/2003  T:  05/21/2003  Job:  [id number]   cc:   Alfonse Alpers. Dagoberto Ligas, M.D.  [address]  [place]  [place] [address]  Fax: [phone]

## 2011-03-30 ENCOUNTER — Ambulatory Visit
Admission: RE | Admit: 2011-03-30 | Discharge: 2011-03-30 | Disposition: A | Discharge status: Home / Self Care | Payer: Medicare Other | Source: Ambulatory Visit | Attending: Radiation Oncology | Admitting: Radiation Oncology

## 2011-04-04 ENCOUNTER — Other Ambulatory Visit: Payer: Self-pay | Admitting: Oncology

## 2011-04-04 ENCOUNTER — Ambulatory Visit (HOSPITAL_COMMUNITY)
Admission: RE | Admit: 2011-04-04 | Discharge: 2011-04-04 | Disposition: A | Discharge status: Home / Self Care | Payer: Medicare Other | Source: Ambulatory Visit | Attending: Oncology | Admitting: Oncology

## 2011-04-04 ENCOUNTER — Encounter (HOSPITAL_BASED_OUTPATIENT_CLINIC_OR_DEPARTMENT_OTHER): Payer: Medicare Other | Admitting: Oncology

## 2011-04-04 DIAGNOSIS — D649 Anemia, unspecified: Secondary | ICD-10-CM

## 2011-04-04 DIAGNOSIS — D702 Other drug-induced agranulocytosis: Secondary | ICD-10-CM

## 2011-04-04 DIAGNOSIS — C024 Malignant neoplasm of lingual tonsil: Secondary | ICD-10-CM

## 2011-04-04 DIAGNOSIS — C099 Malignant neoplasm of tonsil, unspecified: Secondary | ICD-10-CM

## 2011-04-04 DIAGNOSIS — R5381 Other malaise: Secondary | ICD-10-CM

## 2011-04-04 LAB — CMP (CANCER CENTER ONLY)
ALT(SGPT): 15 U/L (ref 10–47)
AST: 19 U/L (ref 11–38)
Albumin: 3.1 g/dL — ABNORMAL LOW (ref 3.3–5.5)
BUN, Bld: 18 mg/dL (ref 7–22)
Calcium: 8.6 mg/dL (ref 8.0–10.3)
Chloride: 101 mEq/L (ref 98–108)
Potassium: 4.2 mEq/L (ref 3.3–4.7)

## 2011-04-04 LAB — CBC WITH DIFFERENTIAL/PLATELET
BASO%: 0.2 % (ref 0.0–2.0)
Basophils Absolute: 0 10*3/uL (ref 0.0–0.1)
EOS%: 3.7 % (ref 0.0–7.0)
HGB: 11.6 g/dL — ABNORMAL LOW (ref 13.0–17.1)
MCH: 30.9 pg (ref 27.2–33.4)
MONO#: 0.3 10*3/uL (ref 0.1–0.9)
RDW: 15.4 % — ABNORMAL HIGH (ref 11.0–14.6)
WBC: 5.4 10*3/uL (ref 4.0–10.3)
lymph#: 0.8 10*3/uL — ABNORMAL LOW (ref 0.9–3.3)

## 2011-04-04 MED ORDER — IOHEXOL 300 MG/ML  SOLN
100.0000 mL | Freq: Once | INTRAMUSCULAR | Status: AC | PRN
  Administered 2011-04-04: 100 mL via INTRAVENOUS

## 2011-04-05 ENCOUNTER — Encounter (HOSPITAL_BASED_OUTPATIENT_CLINIC_OR_DEPARTMENT_OTHER): Payer: Medicare Other | Admitting: Oncology

## 2011-04-05 DIAGNOSIS — I10 Essential (primary) hypertension: Secondary | ICD-10-CM

## 2011-04-05 DIAGNOSIS — F0390 Unspecified dementia without behavioral disturbance: Secondary | ICD-10-CM

## 2011-04-05 DIAGNOSIS — C024 Malignant neoplasm of lingual tonsil: Secondary | ICD-10-CM

## 2011-04-11 ENCOUNTER — Other Ambulatory Visit (HOSPITAL_COMMUNITY): Payer: Self-pay

## 2011-04-19 ENCOUNTER — Ambulatory Visit: Payer: Medicare Other | Attending: Radiation Oncology

## 2011-04-19 DIAGNOSIS — IMO0001 Reserved for inherently not codable concepts without codable children: Secondary | ICD-10-CM | POA: Insufficient documentation

## 2011-04-19 DIAGNOSIS — R1310 Dysphagia, unspecified: Secondary | ICD-10-CM | POA: Insufficient documentation

## 2011-06-22 ENCOUNTER — Ambulatory Visit
Admission: RE | Admit: 2011-06-22 | Discharge: 2011-06-22 | Disposition: A | Discharge status: Home / Self Care | Payer: Medicare Other | Source: Ambulatory Visit | Attending: Radiation Oncology | Admitting: Radiation Oncology

## 2011-06-22 DIAGNOSIS — C01 Malignant neoplasm of base of tongue: Secondary | ICD-10-CM | POA: Insufficient documentation

## 2011-08-12 ENCOUNTER — Inpatient Hospital Stay (HOSPITAL_COMMUNITY)
Admission: EM | Admit: 2011-08-12 | Discharge: 2011-08-14 | DRG: 312 | Disposition: A | Discharge status: Home / Self Care | Payer: Medicare Other | Source: Ambulatory Visit | Attending: Internal Medicine | Admitting: Internal Medicine

## 2011-08-12 ENCOUNTER — Emergency Department (HOSPITAL_COMMUNITY): Payer: Medicare Other

## 2011-08-12 DIAGNOSIS — E785 Hyperlipidemia, unspecified: Secondary | ICD-10-CM | POA: Diagnosis present

## 2011-08-12 DIAGNOSIS — R252 Cramp and spasm: Secondary | ICD-10-CM | POA: Diagnosis not present

## 2011-08-12 DIAGNOSIS — F0390 Unspecified dementia without behavioral disturbance: Secondary | ICD-10-CM | POA: Diagnosis present

## 2011-08-12 DIAGNOSIS — E119 Type 2 diabetes mellitus without complications: Secondary | ICD-10-CM | POA: Diagnosis present

## 2011-08-12 DIAGNOSIS — I10 Essential (primary) hypertension: Secondary | ICD-10-CM | POA: Diagnosis present

## 2011-08-12 DIAGNOSIS — R55 Syncope and collapse: Principal | ICD-10-CM | POA: Diagnosis present

## 2011-08-12 DIAGNOSIS — T441X5A Adverse effect of other parasympathomimetics [cholinergics], initial encounter: Secondary | ICD-10-CM | POA: Diagnosis present

## 2011-08-12 DIAGNOSIS — I5032 Chronic diastolic (congestive) heart failure: Secondary | ICD-10-CM | POA: Diagnosis present

## 2011-08-12 DIAGNOSIS — Z85810 Personal history of malignant neoplasm of tongue: Secondary | ICD-10-CM

## 2011-08-12 DIAGNOSIS — Z794 Long term (current) use of insulin: Secondary | ICD-10-CM

## 2011-08-12 DIAGNOSIS — E860 Dehydration: Secondary | ICD-10-CM | POA: Diagnosis present

## 2011-08-12 DIAGNOSIS — I509 Heart failure, unspecified: Secondary | ICD-10-CM | POA: Diagnosis present

## 2011-08-12 LAB — CBC
HCT: 38.1 % — ABNORMAL LOW (ref 39.0–52.0)
Hemoglobin: 12.4 g/dL — ABNORMAL LOW (ref 13.0–17.0)
MCV: 96 fL (ref 78.0–100.0)
Platelets: 220 10*3/uL (ref 150–400)
RBC: 3.97 MIL/uL — ABNORMAL LOW (ref 4.22–5.81)
WBC: 9.4 10*3/uL (ref 4.0–10.5)

## 2011-08-12 LAB — CK TOTAL AND CKMB (NOT AT ARMC): Total CK: 46 U/L (ref 7–232)

## 2011-08-12 LAB — URINALYSIS, ROUTINE W REFLEX MICROSCOPIC
Bilirubin Urine: NEGATIVE
Glucose, UA: >1000 mg/dL — AB
Ketones, ur: NEGATIVE mg/dL
Protein, ur: NEGATIVE mg/dL
pH: 6 (ref 5.0–8.0)

## 2011-08-12 LAB — APTT: aPTT: 26 seconds (ref 24–37)

## 2011-08-12 LAB — DIFFERENTIAL
Lymphocytes Relative: 10 % — ABNORMAL LOW (ref 12–46)
Lymphs Abs: 0.9 10*3/uL (ref 0.7–4.0)
Neutro Abs: 8 10*3/uL — ABNORMAL HIGH (ref 1.7–7.7)
Neutrophils Relative %: 85 % — ABNORMAL HIGH (ref 43–77)

## 2011-08-12 LAB — TROPONIN I: Troponin I: <0.3 ng/mL (ref <0.30)

## 2011-08-12 LAB — BASIC METABOLIC PANEL
BUN: 27 mg/dL — ABNORMAL HIGH (ref 6–23)
GFR calc Af Amer: 59 mL/min — ABNORMAL LOW (ref >90)
GFR calc non Af Amer: 51 mL/min — ABNORMAL LOW (ref >90)
Potassium: 5 mEq/L (ref 3.5–5.1)
Sodium: 142 mEq/L (ref 135–145)

## 2011-08-12 LAB — POCT I-STAT TROPONIN I: Troponin i, poc: 0.01 ng/mL (ref 0.00–0.08)

## 2011-08-12 LAB — URINE MICROSCOPIC-ADD ON

## 2011-08-12 LAB — GLUCOSE, CAPILLARY

## 2011-08-12 NOTE — H&P (Signed)
NAME:  Dustin Vance, Dustin Vance NO.:  [id number]  MEDICAL RECORD NO.:  [id number]  LOCATION:  [facility]                         FACILITY:  [facility]  PHYSICIAN:  [name], MD  DATE OF BIRTH:  1933/07/11  DATE OF ADMISSION:  08/12/2011 DATE OF DISCHARGE:                             HISTORY & PHYSICAL   PRIMARY CARE PHYSICIAN:  [name], MD, Family Practice.  History obtainable from the patient, the patient's spouse, and family members.  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 today.  The patient claimed that he had brunch and subsequently attempted to use the bathroom.  He passed out suddenly and was held on by his grandson who prevented him from hitting the floor. He denied any premonitory symptoms prior to the onset of passing out. He denied any chest pain.  He denied any shortness of breath.  He denied any nausea or vomiting.  He denied any history of urinary or fecal incontinence and subsequently was brought to the emergency room to be evaluated.  PAST MEDICAL HISTORY: 1. Positive for hypertension. 2. Diabetes mellitus. 3. Cancer of the posterior tongue status post chemoradiotherapy. 4. Hyperlipidemia.  PAST SURGICAL HISTORY:  AAA repair and right inguinal hernia repair.  PREADMISSION MEDS WITHOUT DOSAGES: 1. Donepezil. 2. NovoLog insulin. 3. Ramipril. 4. Simvastatin.  ALLERGIES:  No known allergies.  SOCIAL HISTORY:  Negative for alcohol, tobacco use.  The patient is retired and lives with his spouse.  FAMILY HISTORY:  Positive for cancer of the esophagus as well as brain CA.  REVIEW OF SYSTEMS:  The patient denies any history of headaches.  No blurred vision.  No nausea, vomiting.  No  fever.  No chills.  No rigor. No chest pain or shortness of breath.  No cough.  No abdominal discomfort.  No diarrhea or hematochezia.  No dysuria, hematuria.  No swelling of the lower extremity.  No intolerance to heat or cold and no neuropsychiatric disorder.  PHYSICAL EXAMINATION:  GENERAL:  Elderly man, very pleasant with suboptimal hydration, not in any respiratory distress is present. VITAL SIGNS:  Blood pressure is 141/61, pulse 79, respiratory rate is 21, temperature is 97.7.  HEENT:  Pupils are reactive to light and extraocular muscles are intact. NECK:  Questionable pulsatile mass, right side of neck.  There is no lymphadenopathy and no jugular venous distention. CHEST:  Clear to auscultation. HEART:  Sounds are 1 and 2. ABDOMEN:  Soft, nontender.  Liver, spleen, kidney not palpable.  Bowel sounds are positive.  EXTREMITIES:  No pedal edema. NEUROLOGIC:  Nonfocal. MUSCULOSKELETAL SYSTEM:  Arthritic changes in the knees and feet. NEUROPSYCHIATRIC:  Evaluation is unremarkable. SKIN:  Showed decreased turgor.  LABORATORY DATA:  First set of cardiac markers, troponin-I 0.01. Coagulation profile showed PT 1.02, INR 13.6, and APTT of 26.  Urine microscopy unremarkable as well.  A complete blood count with differential showed WBC of 9.4, hemoglobin of 12.4, hematocrit  38.1, MCV 96.0 with a platelet count of 220,000, neutrophils 85%, absolute granulocyte count is 8.0.  Chemistry showed sodium of 142, potassium of 5.0, chloride of 102 with a bicarb of 29, glucose is 305, BUN is 27, creatinine is 1.32.  EKG showed normal sinus rhythm with no acute ST wave change.  Chest x-ray did not show any clear-cut infiltrates.  IMPRESSION: 1. Syncope, etiology is unknown. 2. Dehydration. 3. Diabetes mellitus. 4. Hypertension. 5. Hyperlipidemia. 6. History of cancer of the posterior tongue status post     chemoradiotherapy. 7. History of abdominal aortic aneursym repair.  PLAN:  To admit  the patient to telemetry.  The patient will be slowly rehydrated with half-normal saline IV to go at rate of 65 mL an hour. He will be on aspirin 81 mg p.o. daily, ramipril 10 mg p.o. daily, and Zocor 20 mg p.o. daily.  He will also be on donepezil 10 mg p.o. daily. Since the patient is diabetic, he will be on Accu-Cheks t.i.d. with a.c. and h.s. with regular insulin sliding scale (moderate scale).  GI prophylaxis will be done with Protonix 40 mg IV q.24 and DVT prophylaxis with Lovenox 40 mg subcu q.24.  In the emergency room, however, the patient was started on treatment for questionable pneumonia with Avelox. This, however, will be discontinued because no indication for avelox.  Chest x-ray was essentially unremarkable.  The patient did not have a fever, did not have white count, did not have cough, and therefore no indication for starting IV antibiotics and this will be discontinued. Further workup to be done on this patient will include cardiac enzymes q.6 x3, D-dimer stat, hemoglobin A1c. CBC, CMP, and magnesium will be repeated in a.m.  Imaging study to be ordered on this patient include CT brain without contrast, which was not originally ordered by ED physician, carotid duplex, 2D echo, and EEG. The patient will be re-evaluated with lab results and he will be followed on a daily basis.     [name], MD     CN/MEDQ  D:  08/12/2011  T:  08/12/2011  Job:  [id number]  Electronically Signed by [name]  on 08/12/2011 09:19:55 PM

## 2011-08-13 ENCOUNTER — Inpatient Hospital Stay (HOSPITAL_COMMUNITY): Payer: Medicare Other

## 2011-08-13 DIAGNOSIS — R55 Syncope and collapse: Secondary | ICD-10-CM

## 2011-08-13 DIAGNOSIS — R569 Unspecified convulsions: Secondary | ICD-10-CM

## 2011-08-13 LAB — CBC
HCT: 33 % — ABNORMAL LOW (ref 39.0–52.0)
Hemoglobin: 10.8 g/dL — ABNORMAL LOW (ref 13.0–17.0)
MCH: 31.2 pg (ref 26.0–34.0)
MCHC: 32.7 g/dL (ref 30.0–36.0)

## 2011-08-13 LAB — DIFFERENTIAL
Basophils Relative: 1 % (ref 0–1)
Lymphocytes Relative: 20 % (ref 12–46)
Monocytes Absolute: 0.5 10*3/uL (ref 0.1–1.0)
Monocytes Relative: 9 % (ref 3–12)
Neutro Abs: 4 10*3/uL (ref 1.7–7.7)

## 2011-08-13 LAB — CARDIAC PANEL(CRET KIN+CKTOT+MB+TROPI)
CK, MB: 2.9 ng/mL (ref 0.3–4.0)
Total CK: 53 U/L (ref 7–232)

## 2011-08-13 LAB — GLUCOSE, CAPILLARY
Glucose-Capillary: 337 mg/dL — ABNORMAL HIGH (ref 70–99)
Glucose-Capillary: 333 mg/dL — ABNORMAL HIGH (ref 70–99)

## 2011-08-13 LAB — COMPREHENSIVE METABOLIC PANEL
BUN: 24 mg/dL — ABNORMAL HIGH (ref 6–23)
Calcium: 8.4 mg/dL (ref 8.4–10.5)
GFR calc Af Amer: 60 mL/min — ABNORMAL LOW (ref >90)
Glucose, Bld: 125 mg/dL — ABNORMAL HIGH (ref 70–99)
Total Protein: 6.2 g/dL (ref 6.0–8.3)

## 2011-08-13 LAB — MAGNESIUM: Magnesium: 2 mg/dL (ref 1.5–2.5)

## 2011-08-13 LAB — HEMOGLOBIN A1C: Hgb A1c MFr Bld: 9.4 % — ABNORMAL HIGH (ref <5.7)

## 2011-08-13 NOTE — Procedures (Signed)
EEG NUMBER:  [id number]  This routine EEG was requested in a 77 year old man who was admitted with a syncopal episode.  The patient has a history of oral cancer, diabetes, hypertension, and hyperlipidemia.  Medications include donepezil and hydromorphone.  The EEG was done with the patient awake, drowsy, and asleep.  During periods of maximal wakefulness, he had a well-regulated, well-sustained 10 cycle per second posterior dominant rhythm that attenuated with eye opening and was symmetric.  Background activities were composed mainly of frontally-dominant beta activities that were symmetric.  Photic stimulation did not produce a driving response.  Hyperventilation was not performed.  The patient did become drowsy as characterized by the attenuation and the alpha rhythm and muscle artifact.  There was the appearance of slower delta and theta activities as well an enhancement of beta activities, particularly at the midline.  Eventually vertex sharp waves and sleep symmetric sleep spindles were also seen.  CLINICAL INTERPRETATION:  This routine electroencephalogram done with the patient awake, drowsy, and asleep is normal.          ______________________________ Denton Meek, MD    WJ:XBJY D:  08/13/2011 13:16:05  T:  08/13/2011 13:36:38  Job #:  [id number]

## 2011-08-14 LAB — GLUCOSE, CAPILLARY
Glucose-Capillary: 217 mg/dL — ABNORMAL HIGH (ref 70–99)
Glucose-Capillary: 291 mg/dL — ABNORMAL HIGH (ref 70–99)

## 2011-08-17 NOTE — Discharge Summary (Signed)
NAME:  Dustin Vance, [name] NO.:  [id number]  MEDICAL RECORD NO.:  [id number]  LOCATION:  4733                         FACILITY:  [facility]  PHYSICIAN:  Rosanna Randy, MDDATE OF BIRTH:  [birth year]  DATE OF ADMISSION:  08/12/2011 DATE OF DISCHARGE:  08/14/2011                              DISCHARGE SUMMARY   PRIMARY CARE PHYSICIAN:  Dustin Gens A. Tawanna Cooler, MD  DISCHARGE DIAGNOSES: 1. Syncope secondary to orthostasis and medication most likely the use     of Aricept. 2. Hypertension. 3. Diabetes mellitus on insulin.  A1c 9.4. 4. Hyperlipidemia. 5. Lower extremity cramping. 6. History of tongue cancer status post radiation and chemotherapy. 7. History of early dementia. 8. Chronic diastolic congestive heart failure, grade 2 with ejection     fraction 60% to 65%, compensated.  Last 2D echo August 13, 2011.  DISCHARGE MEDICATIONS: 1. Aspirin 81 mg by mouth daily. 2. CoQ10 100 mg 1 tablet by mouth every morning.3. Insulin 5 units subcutaneously 3 times a day with meals. 4. Lantus 10 units subcutaneously twice a day. 5. Multivitamins 1 tablet by mouth daily. 6. Vitamin C 500 mg 1 tablet by mouth daily.  The patient was instructed to stop taking the following medications: 1. Ramipril 5 mg 1 capsule by mouth every morning. 2. Simvastatin 40 mg 1 tablet by mouth every morning. 3. Aricept 1 tablet by mouth daily until he follow with primary care     physician, Dr. Kelle Vance and his Aricept steps specifically     until he follows with Dr. Sandria Vance, neurologist.  DISPOSITION AND FOLLOWUP:  The patient had been discharged in stable and improved condition.  Currently, not complaining of any shortness of breath.  No chest pain.  No lightheadedness.  No further syncope episodes or dizziness.  At the moment of discharge after fluid resuscitation, the patient is no longer having orthostasis.  The patient was instructed to hold on the medications specifically Aricept and  also his ramipril.  He is going to follow with Dr. Kelle Vance over the next 7-10 days.  The patient will also arrange a followup appointment with Dr. Sandria Vance for further treatment now that he is stopping his Aricept until further instructions by him.  This appointment will take place over the next 2-3 weeks.  The patient was complaining of lower extremity cramping.  He was advised to hold off on his statins under he follow with Dr. Tawanna Vance or the medications that might be causing this discomfort is the CoQ10 that he is taking.  We are going to let primary care physician to decide on further treatment/medication adjustment as an outpatient.  It will be also important to review the patient's diabetes since his hemoglobin A1c demonstrated a level of 9.4, he might require further adjustment on his insulin doses.  PROCEDURE PERFORMED DURING THIS HOSPITALIZATIONS:  A chest x-ray two- views on August 12, 2011, that demonstrated faint right lower lobe patchy airspace disease suspicious for early pneumonia/atelectasis.  CT of the head without contrast demonstrated chronic atrophy and microvascular ischemia without any acute abnormality.  Chest x-ray two- views done on August 13, 2011, in order to follow initial x-ray suggesting pneumonia after fluid resuscitation,  and noticing the patient without cough, fever, or elevation of white blood cells, demonstrated no consolidation, no pleural effusion, no infiltrates.  There is a stable slight enlargement of the cardiac silhouette.  Chronic prominence of reticular interstitial markings that are stable.  There is an apical, pleural, and parenchymal fibrotic changes stable from previous study. Fibrotic changes in the left base also stable.  All this most likely associated to the radiation therapy that the patient had received. Central peribronchial thickening is also present.  There is improved aeration in the right base.  No definite acute infiltrative  density is seen in the right base.  The patient also had a 2D echo done on August 13, 2011, that demonstrated moderate concentric hypertrophy, normal systolic function with ejection fraction 60% to 65%, and a grade 2 diastolic dysfunction on Doppler parameters.  No cardiac source of emboli was identified.  The patient also have EEG that demonstrated to be completely normal and also had a carotid Dopplers bilaterally done on August 13, 2011, that demonstrated no significant extracranial carotid artery stenosis.  Vertebrals are patent with antegrade flow.  No other procedures were performed during this admission.  No consultations were made.  HISTORY OF PRESENT ILLNESS:  For full details, please refer to dictation done on August 12, 2011, by Dr. Talmage Vance, but briefly the patient is a 77 year old Caucasian male with a past medical history significant for cancer of the posterior tongue and status post chemotherapy and radiation therapy who came into the hospital secondary to syncope episode.  The patient claimed that he had lunch and subsequently attempted to use the bathroom.  He passed out suddenly on his way to the bathroom.  He was held on by his grandson who prevented him from hitting the floor.  He denied any premonitory symptoms prior to the onset of passing out.  The patient denied any chest pain.  He denies any shortness of breath, any nausea, any vomiting.  There was no history reported of urinary or fecal incontinence, and subsequently was brought to the emergency room for further evaluation and treatment.  Pertinent laboratory data throughout this hospitalization includes urinalysis and microscopy, both negative, not showing any abnormality. CBC with differential, white blood cells 9.4, hemoglobin 12.4, platelets 220.  PT 13.6 with an INR of 1.02, PTT 26.  Cardiac markers negative x3. Hemoglobin A1c 9.4.  Magnesium 2.0.  CBC demonstrated a white blood cell of 6.6,  hemoglobin 10.8, platelets 187.  This was done at the moment of discharge.  Comprehensive metabolic panel at discharge demonstrated a sodium of 142, potassium 4.2 chloride 109, bicarb 27, glucose 125, BUN 24, creatinine 1.3.  HOSPITAL COURSE BY PROBLEM: 1. Syncope secondary to orthostasis and medication most likely.  At     this point, ramipril and also Aricept has been discontinued.  The     patient received fluid resuscitation throughout this     hospitalization and at discharge, he is feeling back to baseline,     not having any lightheadedness, dizziness, or any other syncope     episodes.  The patient also denies any other acute complaints.  He     is going to arrange a followup appointment over the next 7-10 days     with primary care physician for further evaluation of his symptoms     and to decide further treatment for his chronic problems especially     his hypertension. 2. Hypertension.  At this point, ramipril has been stopped secondary  to the orthostasis changes that he had on admission.  The patient     was instructed to follow a low-sodium diet and to follow with     primary care physician for further adjustment of his medications. 3. Diabetes mellitus on insulin currently with an A1c of 9.4.  The     patient advised to follow a low-carbohydrate diet, and he is going     to follow with primary care physician for further adjustment of his     insulin. 4. Hyperlipidemia.  At this moment due to the lower extremity cramping     that has been described, the statins has been placed on hold until     he follow with primary care physician.  The patient was advised to     follow a low-fat diet. 5. Lower extremity cramping.  As described on the problem above,     statins has been placed on hold.  There was no elevation of CK.     The patient might benefit of having a aldolase level and further     workup to be done by primary care physician as an outpatient. 6. History of  tongue cancer status post radiation and chemotherapy.     This appears to continue in remission. 7. Early dementia.  The patient was initially using Aricept at this     moment due to the side effects that the Aricept can cause     especially orthostasis, we are going to hold that off, and he is     going to follow with Dr. Sandria Vance for further evaluation and treatment. 8. Grade 2 diastolic dysfunction.  At this moment, ejection fraction     60% to 65%.  This is a chronic problem.  Currently compensated.  No     further evaluation will be done during this hospitalization.  The     patient will follow with primary care physician for further     treatment.  PHYSICAL EXAMINATION:  VITAL SIGNS:  At discharge, temperature 97.4, heart rate 62, respiratory rate 20, blood pressure 143/67, oxygen saturation 95% on room air. GENERAL:  No acute distress. RESPIRATORY:  Clear to auscultation bilaterally. HEART:  Regular rate and rhythm.  No murmurs, gallops, or rubs. ABDOMEN:  Soft, nontender with positive bowel sounds. EXTREMITIES:  Without any edema, cyanosis, or clubbing. NEUROLOGIC:  Nonfocal.     Rosanna Randy, MD     CEM/MEDQ  D:  08/14/2011  T:  08/14/2011  Job:  [id number]  cc:   Dustin Gens A. Tawanna Cooler, MD  Electronically Signed by Vassie Loll MD on 08/17/2011 10:45:30 PM

## 2011-08-23 ENCOUNTER — Ambulatory Visit (INDEPENDENT_AMBULATORY_CARE_PROVIDER_SITE_OTHER): Payer: Medicare Other | Admitting: Family Medicine

## 2011-08-23 ENCOUNTER — Encounter: Payer: Self-pay | Admitting: Family Medicine

## 2011-08-23 DIAGNOSIS — R55 Syncope and collapse: Secondary | ICD-10-CM | POA: Insufficient documentation

## 2011-08-23 DIAGNOSIS — Z23 Encounter for immunization: Secondary | ICD-10-CM

## 2011-08-23 NOTE — Progress Notes (Signed)
Addended by: Kern Reap B on: 08/23/2011 04:33 PM   Modules accepted: Orders

## 2011-08-23 NOTE — Patient Instructions (Signed)
Restart the Aricept 5 mg daily.  Stop the Altace.  BP check at home daily.  Restart the Zocor in December by taking a half a tablet Monday, Wednesday, Friday.  Return p.r.n.

## 2011-08-23 NOTE — Progress Notes (Signed)
  Subjective:    Patient ID: Dustin Vance, male    DOB: 1933/06/28, 77 y.o.   MRN: [id number]  HPI Dona is a 77 year old male, who comes in today accompanied by his wife after a recent hospitalization for a syncopal episode.  He states that he had the family at their house for Sunday dinner.  He was sitting eating and noticed he had extremely full bladder.  He stood up to pee and passed out.  EMS was called.  He was taken the hospital and admitted and had an extensive workup all of which was negative.  His Aricept Zocor, and Altace were held.  I, think it's okay to restart his Aricept.  They would like to hold off on the Zocor because of muscle pain.  We will restart that in amount by taking a half a tablet Monday, Wednesday, Friday.  Also during his hospital stay.  His A1c was elevated.  9.7.  His wife says his blood sugars at home are normal.  110, 120 on NovoLog 5 units before each meal and Lantus 10 units b.i.d..  He stated see his endocrinologist next week  Review of Systems    General and neurologic review of systems otherwise negative Objective:   Physical Exam  Well-developed well-nourished man in no acute distress      Assessment & Plan:  Syncopal episodes continue to hold Altace, and Zocor.  We started Zocor in December by taking a half a tab Monday, Wednesday, Friday.  Restart Aricept.  Return p.r.n.

## 2011-09-13 ENCOUNTER — Other Ambulatory Visit (HOSPITAL_COMMUNITY): Payer: Self-pay | Admitting: Dentistry

## 2011-09-14 ENCOUNTER — Encounter: Payer: Self-pay | Admitting: *Deleted

## 2011-09-17 ENCOUNTER — Encounter: Payer: Self-pay | Admitting: Oncology

## 2011-09-17 DIAGNOSIS — J449 Chronic obstructive pulmonary disease, unspecified: Secondary | ICD-10-CM | POA: Insufficient documentation

## 2011-09-17 DIAGNOSIS — C109 Malignant neoplasm of oropharynx, unspecified: Secondary | ICD-10-CM | POA: Insufficient documentation

## 2011-09-25 ENCOUNTER — Other Ambulatory Visit: Payer: Self-pay | Admitting: Oncology

## 2011-09-25 ENCOUNTER — Ambulatory Visit (HOSPITAL_COMMUNITY)
Admission: RE | Admit: 2011-09-25 | Discharge: 2011-09-25 | Disposition: A | Discharge status: Home / Self Care | Payer: Medicare Other | Source: Ambulatory Visit | Attending: Oncology | Admitting: Oncology

## 2011-09-25 ENCOUNTER — Other Ambulatory Visit (HOSPITAL_BASED_OUTPATIENT_CLINIC_OR_DEPARTMENT_OTHER): Payer: Medicare Other | Admitting: Lab

## 2011-09-25 DIAGNOSIS — Z923 Personal history of irradiation: Secondary | ICD-10-CM | POA: Insufficient documentation

## 2011-09-25 DIAGNOSIS — J984 Other disorders of lung: Secondary | ICD-10-CM | POA: Insufficient documentation

## 2011-09-25 DIAGNOSIS — Z85819 Personal history of malignant neoplasm of unspecified site of lip, oral cavity, and pharynx: Secondary | ICD-10-CM | POA: Insufficient documentation

## 2011-09-25 DIAGNOSIS — Z9221 Personal history of antineoplastic chemotherapy: Secondary | ICD-10-CM | POA: Insufficient documentation

## 2011-09-25 DIAGNOSIS — C024 Malignant neoplasm of lingual tonsil: Secondary | ICD-10-CM

## 2011-09-25 DIAGNOSIS — C099 Malignant neoplasm of tonsil, unspecified: Secondary | ICD-10-CM

## 2011-09-25 DIAGNOSIS — R1310 Dysphagia, unspecified: Secondary | ICD-10-CM | POA: Insufficient documentation

## 2011-09-25 LAB — CBC WITH DIFFERENTIAL/PLATELET
EOS%: 2.5 % (ref 0.0–7.0)
Eosinophils Absolute: 0.1 10*3/uL (ref 0.0–0.5)
MCV: 95.3 fL (ref 79.3–98.0)
MONO%: 9 % (ref 0.0–14.0)
NEUT#: 3.5 10*3/uL (ref 1.5–6.5)
RBC: 3.75 10*6/uL — ABNORMAL LOW (ref 4.20–5.82)
RDW: 14 % (ref 11.0–14.6)
WBC: 5.1 10*3/uL (ref 4.0–10.3)
lymph#: 1 10*3/uL (ref 0.9–3.3)

## 2011-09-25 LAB — CMP (CANCER CENTER ONLY)
AST: 7 U/L — ABNORMAL LOW (ref 11–38)
Albumin: 2.9 g/dL — ABNORMAL LOW (ref 3.3–5.5)
Alkaline Phosphatase: 82 U/L (ref 26–84)
Glucose, Bld: 353 mg/dL — ABNORMAL HIGH (ref 73–118)
Potassium: 4.9 mEq/L — ABNORMAL HIGH (ref 3.3–4.7)
Sodium: 141 mEq/L (ref 128–145)
Total Protein: 6.3 g/dL — ABNORMAL LOW (ref 6.4–8.1)

## 2011-09-25 MED ORDER — IOHEXOL 300 MG/ML  SOLN
100.0000 mL | Freq: Once | INTRAMUSCULAR | Status: AC | PRN
  Administered 2011-09-25: 100 mL via INTRAVENOUS

## 2011-09-26 ENCOUNTER — Ambulatory Visit (HOSPITAL_BASED_OUTPATIENT_CLINIC_OR_DEPARTMENT_OTHER): Payer: Medicare Other | Admitting: Oncology

## 2011-09-26 DIAGNOSIS — B977 Papillomavirus as the cause of diseases classified elsewhere: Secondary | ICD-10-CM

## 2011-09-26 DIAGNOSIS — L905 Scar conditions and fibrosis of skin: Secondary | ICD-10-CM

## 2011-09-26 DIAGNOSIS — C01 Malignant neoplasm of base of tongue: Secondary | ICD-10-CM

## 2011-09-26 DIAGNOSIS — J449 Chronic obstructive pulmonary disease, unspecified: Secondary | ICD-10-CM

## 2011-09-26 DIAGNOSIS — C109 Malignant neoplasm of oropharynx, unspecified: Secondary | ICD-10-CM

## 2011-09-26 DIAGNOSIS — D649 Anemia, unspecified: Secondary | ICD-10-CM

## 2011-09-26 MED ORDER — THROMBIN 5000 UNITS EX SOLR
CUTANEOUS | Status: AC
  Filled 2011-09-26: qty 10000

## 2011-09-26 MED ORDER — BUPIVACAINE-EPINEPHRINE (PF) 0.5% -1:200000 IJ SOLN
INTRAMUSCULAR | Status: AC
  Filled 2011-09-26: qty 10

## 2011-09-26 NOTE — Progress Notes (Signed)
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  PAST THERAPY:  concurrent definitive chemoXRT with weekly Carboplatin/Taxol and daily XRT started on 05/31/10 finished in Oct 2011.  CURRENT THERAPY:  watchful observation.  INTERVAL HISTORY: Dustin Vance 77 y.o. male returns for regular follow up.  He had an episode of syncope in October 2012 with extensive inpatient work up that was negative for neuro, or cardiac causes.  He has not had any more syncopal episode since.  His memory has improved for the past few months.  He still has poor short term memory.  However, he has been able to drive himself and not getting lost.  With regard to history of oropharynx cancer in his treatment, he still has some fibrosis scarring in his anterior neck. He denies any dysphagia, odynophagia, xerostomia. He denies any palpable lymph node welling in the bilateral neck.    Patient denies fatigue, headache, visual changes, confusion, drenching night sweats, palpable lymph node swelling, mucositis, odynophagia, dysphagia, nausea vomiting, jaundice, chest pain, palpitation, shortness of breath, dyspnea on exertion, productive cough, gum bleeding, epistaxis, hematemesis, hemoptysis, abdominal pain, abdominal swelling, early satiety, melena, hematochezia, hematuria, skin rash, spontaneous bleeding, joint swelling, joint pain, heat or cold intolerance, bowel bladder incontinence, back pain, focal motor weakness, paresthesia, depression, suicidal or homocidal ideation, feeling hopelessness.   MEDICAL HISTORY: Past Medical History  Diagnosis Date  . Hyperlipidemia   . AAA (abdominal aortic aneurysm)     status post repair in 2005  . Diabetes mellitus type I   . Hypertension   . Hx of colonic polyps   . CKD (chronic kidney disease)     last  creatinine   . COPD (chronic obstructive pulmonary disease)   . Oropharynx cancer     SURGICAL HISTORY:  Past Surgical History  Procedure Date  . Abdominal aortic aneurysm repair   . Tonsillectomy   . Colonoscopy w/ polypectomy   . Hemorrhoid surgery   . Inguinal hernia repair   . Penial implant     MEDICATIONS: Current Outpatient Prescriptions  Medication Sig Dispense Refill  . aspirin 81 MG tablet Take 81 mg by mouth daily.        . Coenzyme Q10 (COQ10) 100 MG CAPS Take by mouth daily.        [name] donepezil (ARICEPT) 5 MG tablet Take 5 mg by mouth at bedtime as needed.        . insulin glargine (LANTUS) 100 UNIT/ML injection Inject 15 Units into the skin 2 (two) times daily. 10 units nightly  10 mL  4  . insulin lispro (HUMALOG) 100 UNIT/ML injection Inject 10 Units into the skin 3 (three) times daily before meals. Five units prior to each meal  10 mL  5  . Multiple Vitamin (MULTIVITAMIN PO) Take by mouth daily.        . rosuvastatin (CRESTOR) 5 MG tablet Take 5 mg by mouth 3 (three) times a week.          ALLERGIES:   has no known allergies.  REVIEW OF SYSTEMS:  The rest of the 14-point review of system was negative.   Filed Vitals:   09/26/11 1158  BP: 160/76  Pulse: 60  Temp: 97 F (36.1 C)   Wt Readings from Last 3 Encounters:  09/26/11 173 lb 1.6 oz (78.518 kg)  06/22/11 166 lb 6.4 oz (75.479 kg)  08/23/11 169 lb (76.658 kg)   ECOG Performance status: 0  PHYSICAL EXAMINATION:   General:  well-nourished in no acute distress.  Eyes:  no scleral icterus.  ENT:  There were no oropharyngeal lesions.  Neck was without thyromegaly. There was edematous in anterior neck without palpable mass.  Lymphatics:  Negative cervical, supraclavicular or axillary adenopathy.  Respiratory: lungs were clear bilaterally without wheezing or crackles.  Cardiovascular:  Regular rate and rhythm, S1/S2, without murmur, rub or gallop.  There was no pedal edema.  GI:  abdomen was soft, flat,  nontender, nondistended, without organomegaly.  Muscoloskeletal:  no spinal tenderness of palpation of vertebral spine.  Skin exam was without echymosis, petichae.  Neuro exam was nonfocal.  Patient was able to get on and off exam table without assistance.  Gait was normal.  Patient was alerted and oriented.  Attention was good.   Language was appropriate.  Mood was normal without depression.  Speech was not pressured.  Thought content was not tangential.     LABORATORY/RADIOLOGY DATA:  Lab Results  Component Value Date   WBC 5.1 09/25/2011   HGB 12.1* 09/25/2011   HCT 35.7* 09/25/2011   PLT 235 09/25/2011   GLUCOSE 353* 09/25/2011   CHOL 238* 01/30/2011   TRIG 146.0 01/30/2011   HDL 38.70* 01/30/2011   LDLDIRECT 176.4 01/30/2011   LDLCALC 105* 01/20/2010   ALT 14 08/13/2011   AST 7* 09/25/2011   NA 141 09/25/2011   K 4.9* 09/25/2011   CL 106 09/25/2011   CREATININE 1.5* 09/25/2011   BUN 19 09/25/2011   CO2 29 09/25/2011   TSH 1.14 01/30/2011   PSA 0.38 01/30/2011   INR 1.02 08/12/2011   HGBA1C 9.4* 08/12/2011   MICROALBUR 4.9* 01/30/2011    IMAGING:  I personally reviewed this CT and showed the images to the patient and his wife.  In brief, there is no evidence of disease recurrence.   Ct Soft Tissue Neck W Contrast  09/25/2011  *RADIOLOGY REPORT*  Clinical Data: 77 year old male with history of tonsillar cancer status post radiation and chemotherapy.  Dysphagia.  CT NECK WITH CONTRAST  Technique:  Multidetector CT imaging of the neck was performed with intravenous contrast.  Contrast: OMNIPAQUE IOHEXOL 300 MG/ML IV SOLN  Comparison: 04/04/2011.  Findings: Sequelae of radiation including diffuse pharyngeal mucosal space thickening, thickening of the platysma, atrophy of the submandibular glands.  No pharyngeal or tonsillar mass is evident.  Small bilateral level II nodes measure less than 5 mm in short axis.  No new or increased cervical lymph nodes.  Negative parapharyngeal and  sublingual spaces.  Trace retropharyngeal effusion is post XRT in nature. Negative larynx and thyroid.  No superior mediastinal lymphadenopathy.  Severe apical lung disease with emphysema and scarring appears stable. Visualized major vascular structures are patent.  Negative visualized brain parenchyma except for intracranial artery dolichoectasia.  Postoperative changes to the globes. Visualized paranasal sinuses and mastoids are clear.  No acute osseous abnormality identified.  IMPRESSION: 1.  Stable and satisfactory post-therapy appearance of the neck. 2.  Severe chronic lung disease.  Original Report Authenticated By: Harley Hallmark, M.D.    ASSESSMENT AND PLAN: 1.  History of oropharyngeal squamous cell carcinoma: no evidence of recurrence per clinical history, physical exam, imaging test. I recommend continued observation. He has not been smoking cigarettes or using alcohol for many years. 2. Mild anemia:  This is secondary to history of chemotherapy  and anemia of chronic disease.  There is no active bleeding.  There is no need for transfusion.   3. Baseline dementia:  Improved.  He is on Aricept per PCP. 4. Diabetes mellitus, type II:  He is on insulin per PCP. 5. Hyperlipidemia:  He is on simvastatin per PCP. 6. Hypertension:  Well controlled on Ramipril per PCP. 7. Chronic kidney disease:  Creatinine is slightly elevated.  Most likely, this is due to HTN and DM.  No indication for further work up unless significantly worsened.   8. COPD:  He is on inhalers. 9. History of smoking:  He is not smoking at this time.  He does report he occasional has craving.  He is not on any nicotine patch, nicotine gum or Wellbutrin.  Previously he used Chantix.  I discussed with him if he has severe urge to pick up smoking again then he needs to let us know so we can try either nicotine gum/patch +/- Wellbutrin. 10. Follow up with me in about 6 months with neck CT the day prior.

## 2011-10-22 ENCOUNTER — Encounter: Payer: Self-pay | Admitting: *Deleted

## 2011-10-22 DIAGNOSIS — E109 Type 1 diabetes mellitus without complications: Secondary | ICD-10-CM | POA: Insufficient documentation

## 2011-10-22 DIAGNOSIS — Z923 Personal history of irradiation: Secondary | ICD-10-CM | POA: Insufficient documentation

## 2011-10-22 DIAGNOSIS — I10 Essential (primary) hypertension: Secondary | ICD-10-CM | POA: Insufficient documentation

## 2011-10-26 ENCOUNTER — Ambulatory Visit
Admission: RE | Admit: 2011-10-26 | Discharge: 2011-10-26 | Disposition: A | Discharge status: Home / Self Care | Payer: Medicare Other | Source: Ambulatory Visit | Attending: Radiation Oncology | Admitting: Radiation Oncology

## 2011-10-26 ENCOUNTER — Encounter: Payer: Self-pay | Admitting: Radiation Oncology

## 2011-10-26 DIAGNOSIS — C01 Malignant neoplasm of base of tongue: Secondary | ICD-10-CM | POA: Insufficient documentation

## 2011-10-26 DIAGNOSIS — C109 Malignant neoplasm of oropharynx, unspecified: Secondary | ICD-10-CM

## 2011-10-26 MED ORDER — PHENYLEPHRINE HCL 1 % NA SOLN
Freq: Once | NASAL | Status: AC
  Administered 2011-10-26: 15 mL via TOPICAL
  Filled 2011-10-26: qty 7.5

## 2011-10-26 NOTE — Progress Notes (Signed)
Encounter addended by: Glennie Hawk, RN on: 10/26/2011  3:52 PM<BR>     Documentation filed: Charges VN

## 2011-10-26 NOTE — Progress Notes (Signed)
Encounter addended by: Richardean Sale, PHARMD on: 10/26/2011  4:00 PM<BR>     Documentation filed: Rx Order Verification

## 2011-10-26 NOTE — Progress Notes (Signed)
Encounter addended by: Glennie Hawk, RN on: 10/26/2011  3:57 PM<BR>     Documentation filed: Inpatient MAR, Orders

## 2011-10-26 NOTE — Progress Notes (Signed)
Pt states he has had calf cramping intermittently at night x 5-6 mos. States he has mentioned to several drs. Pt has labs 11/15/11  w/Dr Sharl Ma, diabetic dr.  Pt states appetite, energy good. Denies pain other than calf pain.

## 2011-10-27 NOTE — Progress Notes (Signed)
CC:   Dustin Vance, M.D. Jeffrey A. Tawanna Cooler, MD Hermelinda Medicus, M.D.  DIAGNOSIS:  T2 N2c M0 squamous cell carcinoma of the base of tongue.  INTERVAL HISTORY:  Dustin Vance returns to clinic today for followup.  The patient states that he is doing better since he was last seen.  His primary complaint is that of some cramping in the calves it appears and he is seeing a physician later this month regarding this issue.  He denies any worsening pain or worsening swallowing.  He does note that he has to avoid certain foods, especially flaky foods, as these are not chewed as well and are a little bit more difficult for him to swallow. The patient does note a little bit of swelling underneath the chin which has been stable.  The patient did have a CT scan on 09/25/2011 of the neck.  This looked good and was stable with no sign of recurrent or persistent disease.  PHYSICAL EXAMINATION:  Vital Signs:  Weight 174 pounds, blood pressure 141/80, pulse 69, temperature 97.3, respiratory rate 20.  HEENT: Normocephalic, atraumatic.  Oral cavity clear.  Neck:  Supple without any lymphadenopathy.  Cardiovascular:  Regular rate and rhythm. Respiratory:  clear to auscultation.  The patient does have a little bit of edema in the submandibular region, but no suspicious nodularity.  FIBEROPTIC EXAMINATION:  A flexible laryngoscope was passed through the right naris after the use of topical anesthetic.  There was good visualization of the larynx and oropharynx and pictures were taken of the larynx and base of tongue region.  Everything looked good with no suspicious findings.  IMPRESSION AND PLAN:  Dustin Vance is doing satisfactorily at this point and remains clinically no evident disease.  He does have ongoing followup with Dr. Haroldine Laws in March and follow up with Dr. Gaylyn Rong later this summer.  I am going to stagger visits and have him see me several months after he sees Dr.  Gaylyn Rong.    ______________________________ Radene Gunning, M.D., Ph.D. JSM/MEDQ  D:  10/26/2011  T:  10/27/2011  Job:  [id number]

## 2011-12-04 ENCOUNTER — Encounter: Payer: Self-pay | Admitting: Vascular Surgery

## 2011-12-05 ENCOUNTER — Encounter (INDEPENDENT_AMBULATORY_CARE_PROVIDER_SITE_OTHER): Payer: Medicare Other | Admitting: *Deleted

## 2011-12-05 ENCOUNTER — Ambulatory Visit (INDEPENDENT_AMBULATORY_CARE_PROVIDER_SITE_OTHER): Payer: Medicare Other | Admitting: Vascular Surgery

## 2011-12-05 ENCOUNTER — Encounter: Payer: Self-pay | Admitting: Vascular Surgery

## 2011-12-05 VITALS — BP 139/58 | HR 73 | Resp 16 | Ht 71.0 in | Wt 172.0 lb

## 2011-12-05 DIAGNOSIS — I724 Aneurysm of artery of lower extremity: Secondary | ICD-10-CM

## 2011-12-05 DIAGNOSIS — Z48812 Encounter for surgical aftercare following surgery on the circulatory system: Secondary | ICD-10-CM

## 2011-12-05 NOTE — Progress Notes (Signed)
[facility] [place]  Patient name: TERRAN KLINKE MRN: [id number] DOB: July 10, 1933 Sex: male  REASON FOR CONSULT: evaluate for leg cramps. Referred by Dr. Sharl Ma.  HPI: JOHNSTON MADDOCKS is a 78 y.o. male who has been having leg cramps off and on for several months. He experiences pain in his cramps at night. This is relieved somewhat when he wants his legs up. I do not get any clear-cut history of claudication, rest pain, or nonhealing ulcers. Of note I repaired a right popliteal artery aneurysm on this gentleman in 2004. Bypass in the right superficial femoral artery to below knee popliteal artery with a vein graft. 2005 he had repair of an abdominal aortic aneurysm.  Past Medical History  Diagnosis Date  . Hyperlipidemia   . AAA (abdominal aortic aneurysm)     status post repair in 2005  . Hx of colonic polyps   . CKD (chronic kidney disease)     last creatinine   . Oropharynx cancer   . Cancer 04/28/10    R base of tongue, inv squamous cell, HPV +  . Diabetes mellitus type I   . Hypertension   . COPD (chronic obstructive pulmonary disease)   . Hx of radiation therapy 06/01/10 to 07/21/10    R base of tongue    Family History  Problem Relation Age of Onset  . Cancer Other   . Stroke Other   . Cancer Mother     unknown type  . Cancer Sister     lung  . Cancer Sister     brain    SOCIAL HISTORY: History  Substance Use Topics  . Smoking status: Former Smoker -- 1.0 packs/day for 50 years    Types: Cigarettes    Quit date: 01/29/2009  . Smokeless tobacco: Not on file  . Alcohol Use: No    No Known Allergies  Current Outpatient Prescriptions  Medication Sig Dispense Refill  . aspirin 81 MG tablet Take 81 mg by mouth daily.        . Coenzyme Q10 (COQ10) 100 MG CAPS Take by mouth daily.        [name] donepezil (ARICEPT) 5 MG tablet Take 10 mg by mouth at bedtime as needed.       . insulin glargine (LANTUS) 100 UNIT/ML injection Inject 15 Units into the  skin 2 (two) times daily. 10 units nightly  10 mL  4  . insulin lispro (HUMALOG) 100 UNIT/ML injection Inject 10 Units into the skin 3 (three) times daily before meals. Five units prior to each meal  10 mL  5  . Multiple Vitamin (MULTIVITAMIN PO) Take by mouth daily.        . rosuvastatin (CRESTOR) 5 MG tablet Take 5 mg by mouth 3 (three) times a week.          REVIEW OF SYSTEMS: Arly.Keller ] denotes positive finding; [  ] denotes negative finding  CARDIOVASCULAR:  [ ]  chest pain   [ ]  chest pressure   [ ]  palpitations   [ ]  orthopnea   [ ]  dyspnea on exertion   [ ]  claudication   [ ]  rest pain   [ ]  DVT   [ ]  phlebitis PULMONARY:   [ ]  productive cough   [ ]  asthma   [ ]  wheezing NEUROLOGIC:   [ ]  weakness  [ ]  paresthesias  [ ]  aphasia  [ ]  amaurosis  [ ]  dizziness HEMATOLOGIC:   [ ]   bleeding problems   [ ]  clotting disorders MUSCULOSKELETAL:  [ ]  joint pain   [ ]  joint swelling Arly.Keller ] leg swelling- right leg, mild GASTROINTESTINAL: [ ]   blood in stool  [ ]   hematemesis GENITOURINARY:  [ ]   dysuria  [ ]   hematuria PSYCHIATRIC:  [ ]  history of major depression INTEGUMENTARY:  [ ]  rashes  [ ]  ulcers CONSTITUTIONAL:  [ ]  fever   [ ]  chills  PHYSICAL EXAM: Filed Vitals:   12/05/11 1421  BP: 139/58  Pulse: 73  Resp: 16  Height: 5\' 11"  (1.803 m)  Weight: 172 lb (78.019 kg)  SpO2: 98%   Body mass index is 23.99 kg/(m^2). GENERAL: The patient is a well-nourished male, in no acute distress. The vital signs are documented above. CARDIOVASCULAR: There is a regular rate and rhythm without significant murmur appreciated. Do not detect carotid bruits. He has palpable femoral popliteal and dorsalis pedis pulses bilaterally. PULMONARY: There is good air exchange bilaterally without wheezing or rales. ABDOMEN: Soft and non-tender with normal pitched bowel sounds.  MUSCULOSKELETAL: There are no major deformities or cyanosis. NEUROLOGIC: No focal weakness or paresthesias are detected. SKIN: There are no  ulcers or rashes noted. PSYCHIATRIC: The patient has a normal affect.  DATA:  Lab Results  Component Value Date   WBC 5.1 09/25/2011   HGB 12.1* 09/25/2011   HCT 35.7* 09/25/2011   MCV 95.3 09/25/2011   PLT 235 09/25/2011   Lab Results  Component Value Date   NA 141 09/25/2011   K 4.9* 09/25/2011   CL 106 09/25/2011   CO2 29 09/25/2011   Lab Results  Component Value Date   CREATININE 1.5* 09/25/2011    Lab Results  Component Value Date   HGBA1C 9.4* 08/12/2011   I have independently interpreted his arterial Doppler study in our office today which shows an ABI of 100% on the right and 100% on the left. He has triphasic waveforms in both lower extremity and also triphasic waveforms noted to route his right lower extremity bypass graft.  MEDICAL ISSUES: I reassured the patient I did not think her cramps related to arterial insufficiency. He has normal ABIs bilaterally a patent bypass graft in the right leg. Given his history of an abdominal aortic aneurysm and right popliteal artery aneurysm I will see him back in one year with the duplex on the left side. I do not palpate an aneurysm on exam I think this would be reasonable given his history. He knows to call sooner if he has problems.   Homer Pfeifer S [facility] [place] [facility]: [phone]

## 2011-12-12 NOTE — Procedures (Unsigned)
BYPASS GRAFT EVALUATION  INDICATION:  Bypass graft  HISTORY: Diabetes:  Yes Cardiac:  No Hypertension:  Yes Smoking:  Yes Previous Surgery:  Right superficial femoral to popliteal artery bypass graft in 2004, open abdominal aortic aneurysm repair in 2005.  SINGLE LEVEL ARTERIAL EXAM                              RIGHT              LEFT Brachial: Anterior tibial: Posterior tibial: Peroneal: Ankle/brachial index:  PREVIOUS ABI:  Date:  RIGHT:  LEFT:  LOWER EXTREMITY BYPASS GRAFT DUPLEX EXAM:  DUPLEX:  Triphasic Doppler waveforms noted throughout the right lower extremity bypass graft with no increased velocities.  IMPRESSION:  Patent right lower extremity bypass graft with no evidence of stenosis.  Bilateral ankle brachial indices are noted on a separate report.        ___________________________________________ Di Kindle. Edilia Bo, M.D.  CH/MEDQ  D:  12/07/2011  T:  12/07/2011  Job:  [id number]

## 2012-01-08 NOTE — Progress Notes (Signed)
Received office notes from Dr. Hermelinda Medicus; forwarded to Dr. Gaylyn Rong.

## 2012-01-31 ENCOUNTER — Ambulatory Visit: Payer: Medicare Other | Admitting: Family Medicine

## 2012-03-26 ENCOUNTER — Ambulatory Visit (HOSPITAL_COMMUNITY)
Admission: RE | Admit: 2012-03-26 | Discharge: 2012-03-26 | Disposition: A | Discharge status: Home / Self Care | Payer: Medicare Other | Source: Ambulatory Visit | Attending: Oncology | Admitting: Oncology

## 2012-03-26 ENCOUNTER — Other Ambulatory Visit (HOSPITAL_BASED_OUTPATIENT_CLINIC_OR_DEPARTMENT_OTHER): Payer: Medicare Other | Admitting: Lab

## 2012-03-26 DIAGNOSIS — C01 Malignant neoplasm of base of tongue: Secondary | ICD-10-CM

## 2012-03-26 DIAGNOSIS — Z85819 Personal history of malignant neoplasm of unspecified site of lip, oral cavity, and pharynx: Secondary | ICD-10-CM | POA: Insufficient documentation

## 2012-03-26 DIAGNOSIS — J449 Chronic obstructive pulmonary disease, unspecified: Secondary | ICD-10-CM

## 2012-03-26 DIAGNOSIS — J984 Other disorders of lung: Secondary | ICD-10-CM | POA: Insufficient documentation

## 2012-03-26 DIAGNOSIS — Z9221 Personal history of antineoplastic chemotherapy: Secondary | ICD-10-CM | POA: Insufficient documentation

## 2012-03-26 DIAGNOSIS — J438 Other emphysema: Secondary | ICD-10-CM | POA: Insufficient documentation

## 2012-03-26 DIAGNOSIS — Z5111 Encounter for antineoplastic chemotherapy: Secondary | ICD-10-CM

## 2012-03-26 DIAGNOSIS — R1310 Dysphagia, unspecified: Secondary | ICD-10-CM | POA: Insufficient documentation

## 2012-03-26 DIAGNOSIS — C109 Malignant neoplasm of oropharynx, unspecified: Secondary | ICD-10-CM

## 2012-03-26 DIAGNOSIS — Z923 Personal history of irradiation: Secondary | ICD-10-CM | POA: Insufficient documentation

## 2012-03-26 LAB — COMPREHENSIVE METABOLIC PANEL
Alkaline Phosphatase: 73 U/L (ref 39–117)
BUN: 22 mg/dL (ref 6–23)
Glucose, Bld: 222 mg/dL — ABNORMAL HIGH (ref 70–99)
Total Bilirubin: 0.5 mg/dL (ref 0.3–1.2)

## 2012-03-26 LAB — CBC WITH DIFFERENTIAL/PLATELET
Basophils Absolute: 0 10*3/uL (ref 0.0–0.1)
Eosinophils Absolute: 0.2 10*3/uL (ref 0.0–0.5)
HGB: 12 g/dL — ABNORMAL LOW (ref 13.0–17.1)
LYMPH%: 26.4 % (ref 14.0–49.0)
MCV: 93.8 fL (ref 79.3–98.0)
MONO%: 9.9 % (ref 0.0–14.0)
NEUT#: 2.9 10*3/uL (ref 1.5–6.5)
Platelets: 204 10*3/uL (ref 140–400)

## 2012-03-28 ENCOUNTER — Ambulatory Visit (HOSPITAL_BASED_OUTPATIENT_CLINIC_OR_DEPARTMENT_OTHER): Payer: Medicare Other | Admitting: Oncology

## 2012-03-28 ENCOUNTER — Telehealth: Payer: Self-pay | Admitting: Oncology

## 2012-03-28 VITALS — BP 132/73 | HR 70 | Temp 97.0°F | Ht 71.0 in | Wt 170.5 lb

## 2012-03-28 DIAGNOSIS — I129 Hypertensive chronic kidney disease with stage 1 through stage 4 chronic kidney disease, or unspecified chronic kidney disease: Secondary | ICD-10-CM

## 2012-03-28 DIAGNOSIS — C109 Malignant neoplasm of oropharynx, unspecified: Secondary | ICD-10-CM

## 2012-03-28 DIAGNOSIS — N189 Chronic kidney disease, unspecified: Secondary | ICD-10-CM

## 2012-03-28 DIAGNOSIS — D6481 Anemia due to antineoplastic chemotherapy: Secondary | ICD-10-CM

## 2012-03-28 DIAGNOSIS — C090 Malignant neoplasm of tonsillar fossa: Secondary | ICD-10-CM

## 2012-03-28 NOTE — Telephone Encounter (Signed)
Appt made and printed for pt aom 

## 2012-03-28 NOTE — Patient Instructions (Addendum)
A.  Result of CT neck 03/26/12:  CT NECK WITHOUT CONTRAST  Technique: Multidetector CT imaging of the neck was performed  without intravenous contrast.  Comparison: CT neck 09/25/2011  Findings: Post radiation therapy changes are present with ventral  neck skin thickening and atrophy of the submandibular glands.  This is stable. There is also mucosal thickening in the  pharynx due to radiation therapy, this is stable. Thickening of the  uvula due to radiation is stable.  No recurrent mass lesion. Negative for adenopathy.  Lung apices show emphysema and scarring. No acute bony  abnormality.   IMPRESSION:  Post radiation changes. No recurrent tumor or adenopathy.  B.  Follow up in about 1 year.  There is no indication for routine neck CT as you're two years out from the finish of therapy.  Please continue to follow up with ENT and Radiation Oncologist who can perform examination of your throat.  If there is anything abnormal on exam with ENT and Radiation Oncologist, of course we can repeat scan again.

## 2012-03-28 NOTE — Progress Notes (Signed)
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   PAST THERAPY: concurrent definitive chemoXRT with weekly Carboplatin/Taxol and daily XRT started on 05/31/10 finished in Oct 2011.   CURRENT THERAPY: watchful observation.  INTERVAL HISTORY: Dustin Vance 78 y.o. male returns for regular follow up with his wife.  He still has xerostomia; however this is improved compared to before. He thinks his strength is almost completely recovered him. He is independent of all activities of daily living. He still has dysphagia with dried foods such as breads or chicken.  He has some swelling in the submental area without a discrete mass. He denies any palpable lymph node bilaterally and his neck. He denies wife think that his memory is slightly improving.  He said that his short-term memory is still a problem. However his long-term memory is quite intact.  He has been able to drive for short distance without any loss lately. He denies smoking cigarettes, chewing tobacco, taking alcohol.  Patient denies fatigue, headache, visual changes, confusion, drenching night sweats, palpable lymph node swelling, mucositis, odynophagia, dysphagia, nausea vomiting, jaundice, chest pain, palpitation, shortness of breath, dyspnea on exertion, productive cough, gum bleeding, epistaxis, hematemesis, hemoptysis, abdominal pain, abdominal swelling, early satiety, melena, hematochezia, hematuria, skin rash, spontaneous bleeding, joint swelling, joint pain, heat or cold intolerance, bowel bladder incontinence, back pain, focal motor weakness, paresthesia, depression, suicidal or homocidal ideation, feeling hopelessness.   Past Medical History  Diagnosis Date  . Hyperlipidemia   . AAA (abdominal aortic  aneurysm)     status post repair in 2005  . Hx of colonic polyps   . CKD (chronic kidney disease)     last creatinine   . Oropharynx cancer   . Cancer 04/28/10    R base of tongue, inv squamous cell, HPV +  . Diabetes mellitus type I   . Hypertension   . COPD (chronic obstructive pulmonary disease)   . Hx of radiation therapy 06/01/10 to 07/21/10    R base of tongue    Past Surgical History  Procedure Date  . Tonsillectomy   . Colonoscopy w/ polypectomy   . Hemorrhoid surgery   . Inguinal hernia repair   . Penial implant   . Rotator cuff repair   . Appendectomy   . Abdominal aortic aneurysm repair     Current Outpatient Prescriptions  Medication Sig Dispense Refill  . aspirin 81 MG tablet Take 81 mg by mouth daily.        . Coenzyme Q10 (COQ10) 100 MG CAPS Take by mouth daily.        [name] donepezil (ARICEPT) 5 MG tablet Take 10 mg by mouth at bedtime as needed.       . insulin glargine (LANTUS) 100 UNIT/ML injection Inject 10 Units into the skin. 10 units nightly      . insulin lispro (HUMALOG) 100 UNIT/ML injection Inject 6 Units into the skin 3 (three) times daily before meals. Five units prior to each meal      . Multiple Vitamin (MULTIVITAMIN PO) Take by mouth daily.        . rosuvastatin (CRESTOR) 5 MG tablet Take 5 mg by mouth 3 (three) times a week.        [name] DISCONTD: insulin glargine (LANTUS) 100 UNIT/ML injection Inject  15 Units into the skin 2 (two) times daily. 10 units nightly  10 mL  4  . DISCONTD: insulin lispro (HUMALOG) 100 UNIT/ML injection Inject 10 Units into the skin 3 (three) times daily before meals. Five units prior to each meal  10 mL  5    ALLERGIES:   has no known allergies.  REVIEW OF SYSTEMS:  The rest of the 14-point review of system was negative.   Filed Vitals:   03/28/12 1214  BP: 132/73  Pulse: 70  Temp: 97 F (36.1 C)   Wt Readings from Last 3 Encounters:  03/28/12 170 lb 8 oz (77.338 kg)  12/05/11 172 lb (78.019 kg)  10/26/11 174 lb  6.4 oz (79.107 kg)   ECOG Performance status: 1  PHYSICAL EXAMINATION:   General:  well-nourished man, in no acute distress.  Eyes:  no scleral icterus.  ENT:  There were no oropharyngeal lesions.  Neck was without thyromegaly.  There was submental edema without discrete palpable mass. Lymphatics:  Negative cervical, supraclavicular or axillary adenopathy.  Respiratory: lungs were clear bilaterally without wheezing or crackles.  Cardiovascular:  Regular rate and rhythm, S1/S2, without murmur, rub or gallop.  There was no pedal edema.  GI:  abdomen was soft, flat, nontender, nondistended, without organomegaly.  Muscoloskeletal:  no spinal tenderness of palpation of vertebral spine.  Skin exam was without echymosis, petichae.  Neuro exam was nonfocal.  Patient was able to get on and off exam table without assistance.  Gait was normal.  Patient was alerted and oriented.  Attention was good.   Language was appropriate.  Mood was normal without depression.  Speech was not pressured.  Thought content was not tangential.      LABORATORY/RADIOLOGY DATA:  Lab Results  Component Value Date   WBC 5.0 03/26/2012   HGB 12.0* 03/26/2012   HCT 36.3* 03/26/2012   PLT 204 03/26/2012   GLUCOSE 222* 03/26/2012   CHOL 238* 01/30/2011   TRIG 146.0 01/30/2011   HDL 38.70* 01/30/2011   LDLDIRECT 176.4 01/30/2011   LDLCALC 105* 01/20/2010   ALKPHOS 73 03/26/2012   ALT 13 03/26/2012   AST 13 03/26/2012   NA 139 03/26/2012   K 4.0 03/26/2012   CL 106 03/26/2012   CREATININE 1.33 03/26/2012   BUN 22 03/26/2012   CO2 26 03/26/2012   PSA 0.38 01/30/2011   INR 1.02 08/12/2011   HGBA1C 9.4* 08/12/2011   MICROALBUR 4.9* 01/30/2011    Ct Soft Tissue Neck Wo Contrast:  I personally reviewed the CT and showed the images to the patient and his wife today.  03/26/2012  *RADIOLOGY REPORT*  Clinical Data: History of tonsillar cancer with chemo radiation. Dysphagia.  CT NECK WITHOUT CONTRAST  Technique:  Multidetector CT imaging of the  neck was performed without intravenous contrast.  Comparison: CT neck 09/25/2011  Findings: Post radiation therapy changes are present with ventral neck skin   thickening and atrophy of the submandibular glands. This is stable.  There is also     mucosal thickening in the pharynx due to radiation therapy, this is stable. Thickening of the uvula due to radiation is stable.  No recurrent mass lesion.  Negative for adenopathy.  Lung apices show emphysema and scarring.  No acute bony abnormality.  IMPRESSION: Post radiation changes.  No recurrent tumor or adenopathy.  Original Report Authenticated By: Camelia Phenes, M.D.    ASSESSMENT AND PLAN:   1. History of oropharyngeal squamous cell carcinoma: no evidence of  recurrence per clinical history, physical exam, neck CT. I recommend continued observation. He has not been smoking cigarettes or using alcohol for many years.There is no indication for routine neck CT as you're two years out from the finish of therapy.  I advised him to continue to follow up with ENT and Radiation Oncologist who can perform examination of his throat.  If there is anything abnormal on exam with ENT and Radiation Oncologist, of course we can repeat scan again.  2. Mild anemia: This is secondary to history of chemotherapy and anemia of chronic disease. There is no active bleeding. There is no need for transfusion.  3. Baseline dementia: short term memory loss.  He continues to improve. He is on Aricept per PCP. 4. Diabetes mellitus, type II: He is on insulin per PCP. 5. Hyperlipidemia: He is on rosuvastatin per PCP. 6. Hypertension: Well controlled on Ramipril per PCP. 7. Chronic kidney disease: Creatinine is slightly elevated. Most likely, this is due to HTN and DM. No indication for further work up unless significantly worsened.  8. COPD: He is on inhalers. 9. History of smoking: none at this time.  10. Submental edema from treatment:  I referred him to lymph edema clinic.   11. Follow up:  In about 1 year.      The length of time of the face-to-face encounter was 15 minutes. More than 50% of time was spent counseling and coordination of care.

## 2012-03-28 NOTE — Progress Notes (Signed)
Faxed Referral to Lymphedema Clinic at [facility] fax [phone] per Dr. Gaylyn Rong.

## 2012-04-01 ENCOUNTER — Encounter: Payer: Self-pay | Admitting: Family Medicine

## 2012-04-01 ENCOUNTER — Ambulatory Visit (INDEPENDENT_AMBULATORY_CARE_PROVIDER_SITE_OTHER): Payer: Medicare Other | Admitting: Family Medicine

## 2012-04-01 VITALS — BP 160/78 | Temp 97.9°F | Ht 71.0 in | Wt 172.5 lb

## 2012-04-01 DIAGNOSIS — E785 Hyperlipidemia, unspecified: Secondary | ICD-10-CM

## 2012-04-01 DIAGNOSIS — E109 Type 1 diabetes mellitus without complications: Secondary | ICD-10-CM

## 2012-04-01 DIAGNOSIS — C109 Malignant neoplasm of oropharynx, unspecified: Secondary | ICD-10-CM

## 2012-04-01 DIAGNOSIS — J449 Chronic obstructive pulmonary disease, unspecified: Secondary | ICD-10-CM

## 2012-04-01 DIAGNOSIS — J4489 Other specified chronic obstructive pulmonary disease: Secondary | ICD-10-CM

## 2012-04-01 DIAGNOSIS — R413 Other amnesia: Secondary | ICD-10-CM

## 2012-04-01 DIAGNOSIS — I10 Essential (primary) hypertension: Secondary | ICD-10-CM

## 2012-04-01 LAB — LIPID PANEL
Cholesterol: 172 mg/dL (ref 0–200)
LDL Cholesterol: 131 mg/dL — ABNORMAL HIGH (ref 0–99)
VLDL: 22.8 mg/dL (ref 0.0–40.0)

## 2012-04-01 LAB — HEMOGLOBIN A1C: Hgb A1c MFr Bld: 9.6 % — ABNORMAL HIGH (ref 4.6–6.5)

## 2012-04-01 LAB — POCT URINALYSIS DIPSTICK
Spec Grav, UA: >=1.03
Urobilinogen, UA: 0.2
pH, UA: 5.5

## 2012-04-01 MED ORDER — DONEPEZIL HCL 5 MG PO TABS: 10.0000 mg | ORAL_TABLET | Freq: Every evening | ORAL | Status: DC | PRN

## 2012-04-01 MED ORDER — ROSUVASTATIN CALCIUM 5 MG PO TABS: 5.0000 mg | ORAL_TABLET | ORAL | Status: DC

## 2012-04-01 NOTE — Patient Instructions (Signed)
Continue your current medications  I will call you about your lab work later on this week  Followup in 1 year sooner if any problems

## 2012-04-01 NOTE — Progress Notes (Signed)
  Subjective:    Patient ID: Dustin Vance, male    DOB: Dec 15, 1932, 78 y.o.   MRN: [id number]  HPI Dustin Vance is a 78 year old married male nonsmoker who comes in today for multiple issues  And a Medicare wellness exam  He takes Aricept 5 mg daily because of early dementia  He sees his endocrinologist to manage his diabetes however blood sugars ranged in the 200-250 range and he recently has developed a peripheral neuropathy  He takes Crestor 5 mg 3 times weekly for hyperlipidemia along with an aspirin tablet.  Cognitive function is slightly impaired his wife does all the finances. He does not exercise on a regular basis. Home health safety reviewed no issues identified no guns in the house, he does have a health care power of attorney and living will. Tetanus 2006 Pneumovax x2 information given on shingles   Review of Systems  Constitutional: Negative.   HENT: Negative.   Eyes: Negative.   Respiratory: Negative.   Cardiovascular: Negative.   Gastrointestinal: Negative.   Genitourinary: Negative.   Musculoskeletal: Negative.   Skin: Negative.   Neurological: Negative.   Hematological: Negative.   Psychiatric/Behavioral: Negative.        Objective:   Physical Exam  Constitutional: He is oriented to person, place, and time. He appears well-developed and well-nourished.  HENT:  Head: Normocephalic and atraumatic.  Right Ear: External ear normal.  Left Ear: External ear normal.  Nose: Nose normal.  Mouth/Throat: Oropharynx is clear and moist.  Eyes: Conjunctivae and EOM are normal. Pupils are equal, round, and reactive to light.  Neck: Normal range of motion. Neck supple. No JVD present. No tracheal deviation present. No thyromegaly present.  Cardiovascular: Normal rate, regular rhythm, normal heart sounds and intact distal pulses.  Exam reveals no gallop and no friction rub.   No murmur heard. Pulmonary/Chest: Effort normal and breath sounds normal. No stridor. No respiratory  distress. He has no wheezes. He has no rales. He exhibits no tenderness.  Abdominal: Soft. Bowel sounds are normal. He exhibits no distension and no mass. There is no tenderness. There is no rebound and no guarding.  Genitourinary: Rectum normal, prostate normal and penis normal. Guaiac negative stool. No penile tenderness.       Urologic prosthesis  Musculoskeletal: Normal range of motion. He exhibits no edema and no tenderness.  Lymphadenopathy:    He has no cervical adenopathy.  Neurological: He is alert and oriented to person, place, and time. He has normal reflexes. No cranial nerve deficit. He exhibits normal muscle tone.  Skin: Skin is warm and dry. No rash noted. No erythema. No pallor.  Psychiatric: He has a normal mood and affect. His behavior is normal. Judgment and thought content normal.          Assessment & Plan:  Healthy male  Diabetes type 1 continue followup by endocrinology  Memory loss continue Aricept 5 mg daily  Hyperlipidemia continue Crestor and aspirin

## 2012-04-24 ENCOUNTER — Ambulatory Visit: Payer: Medicare Other | Attending: Oncology | Admitting: Physical Therapy

## 2012-04-24 DIAGNOSIS — IMO0001 Reserved for inherently not codable concepts without codable children: Secondary | ICD-10-CM | POA: Insufficient documentation

## 2012-04-24 DIAGNOSIS — I890 Lymphedema, not elsewhere classified: Secondary | ICD-10-CM | POA: Insufficient documentation

## 2012-04-28 ENCOUNTER — Ambulatory Visit: Payer: Medicare Other | Admitting: Physical Therapy

## 2012-04-30 ENCOUNTER — Ambulatory Visit: Payer: Medicare Other

## 2012-05-07 ENCOUNTER — Ambulatory Visit: Payer: Medicare Other | Admitting: Physical Therapy

## 2012-05-09 ENCOUNTER — Ambulatory Visit: Payer: Medicare Other | Admitting: Physical Therapy

## 2012-05-12 ENCOUNTER — Ambulatory Visit: Payer: Medicare Other | Admitting: Physical Therapy

## 2012-05-13 NOTE — Progress Notes (Signed)
Received discharge summary for physical therapy services @ [facility]; forwarded to Dr. Gaylyn Rong.

## 2012-05-19 ENCOUNTER — Encounter: Payer: Medicare Other | Admitting: Physical Therapy

## 2012-06-27 ENCOUNTER — Ambulatory Visit: Payer: Medicare Other | Admitting: Radiation Oncology

## 2012-07-02 ENCOUNTER — Encounter: Payer: Self-pay | Admitting: Radiation Oncology

## 2012-07-04 ENCOUNTER — Ambulatory Visit
Admission: RE | Admit: 2012-07-04 | Discharge: 2012-07-04 | Disposition: A | Discharge status: Home / Self Care | Payer: Medicare Other | Source: Ambulatory Visit | Attending: Radiation Oncology | Admitting: Radiation Oncology

## 2012-07-04 ENCOUNTER — Encounter: Payer: Self-pay | Admitting: Radiation Oncology

## 2012-07-04 VITALS — BP 156/67 | HR 70 | Temp 97.9°F | Wt 169.7 lb

## 2012-07-04 DIAGNOSIS — C024 Malignant neoplasm of lingual tonsil: Secondary | ICD-10-CM

## 2012-07-04 DIAGNOSIS — Z79899 Other long term (current) drug therapy: Secondary | ICD-10-CM | POA: Insufficient documentation

## 2012-07-04 DIAGNOSIS — C109 Malignant neoplasm of oropharynx, unspecified: Secondary | ICD-10-CM | POA: Insufficient documentation

## 2012-07-04 MED ORDER — LARYNGOSCOPY SOLUTION RAD-ONC
15.0000 mL | Freq: Once | TOPICAL | Status: AC
  Administered 2012-07-04: 15 mL via TOPICAL

## 2012-07-04 NOTE — Addendum Note (Signed)
Encounter addended by: Tessa Lerner, RN on: 07/04/2012  4:28 PM<BR>     Documentation filed: Inpatient MAR

## 2012-07-04 NOTE — Progress Notes (Signed)
Patient here for routine follow up radiation treatment of base of tongue treatment competed on 07/21/10.Denies pain.No hospitalizations or problems since last follow up Jan. 2013.Scheduled to Dr.Crossley next week.

## 2012-07-04 NOTE — Progress Notes (Signed)
Radiation Oncology         [phone] ________________________________  Name: Dustin Vance MRN: [id number]  Date: 07/04/2012  DOB: [birth year]  Follow-Up Visit Note  CC: Evette Georges, MD  Roderick Pee, MD  Diagnosis:   T2N2cM0 squamous cell carcinoma of the oropharynx  Interval Since Last Radiation:  2 years   Narrative:  The patient returns today for routine follow-up.  The patient indicates that he really has been stable. Continued swelling in the submental region but this has been stable. No pain in the head neck region. No odynophagia. The patient states that he does have some difficulty swallowing certain foods such as dry, flaky foods but otherwise he does well with most of his by mouth intake. Continued xerostomia but this has improved significantly since he finished treatment. The patient did have a CT scan of the neck in June of this year with post radiation changes. No recurrent tumor or adenopathy.                              ALLERGIES:   has no known allergies.  Meds: Current Outpatient Prescriptions  Medication Sig Dispense Refill  . aspirin 81 MG tablet Take 81 mg by mouth daily.        . Coenzyme Q10 (COQ10) 100 MG CAPS Take by mouth daily.        [name] donepezil (ARICEPT) 5 MG tablet Take 2 tablets (10 mg total) by mouth at bedtime as needed.  100 tablet  3  . insulin glargine (LANTUS) 100 UNIT/ML injection Inject 10 Units into the skin. 10 units nightly      . insulin lispro (HUMALOG) 100 UNIT/ML injection Inject 6 Units into the skin 3 (three) times daily before meals. Five units prior to each meal      . Multiple Vitamin (MULTIVITAMIN PO) Take by mouth daily.        . rosuvastatin (CRESTOR) 5 MG tablet Take 1 tablet (5 mg total) by mouth 3 (three) times a week.  50 tablet  3    Physical Findings: The patient is in no acute distress. Patient is alert and oriented.  weight is 169 lb 11.2 oz (76.975 kg). His temperature is 97.9 F (36.6 C). His blood  pressure is 156/67 and his pulse is 70. His oxygen saturation is 98%. .   General: Well-developed, in no acute distress HEENT: Normocephalic, atraumatic Cardiovascular: Regular rate and rhythm Respiratory: Clear to auscultation bilaterally GI: Soft, nontender, normal bowel sounds Extremities: No edema present  Fiberoptic exam: After the use of topical anesthetic, a flexible scope was passed to the right near. This was tight through the nasal region but good visualization was ultimately obtained and no suspicious areas or lesions seen in the larynx, hypopharynx or oropharynx. Pictures were taken.  Lab Findings: Lab Results  Component Value Date   WBC 5.0 03/26/2012   HGB 12.0* 03/26/2012   HCT 36.3* 03/26/2012   MCV 93.8 03/26/2012   PLT 204 03/26/2012     Radiographic Findings: No results found.  Impression:    78 year old male who remains NED from his head neck cancer. No new or worsening symptoms.  Plan:  The patient will followup in our clinic for ongoing followup in 6 months.  I spent 15 minutes with the patient today, the majority of which was spent counseling him on his diagnosis and coordinating his care.   Radene Gunning, M.D., Ph.D.

## 2012-07-04 NOTE — Addendum Note (Signed)
Encounter addended by: Tessa Lerner, RN on: 07/04/2012  4:21 PM<BR>     Documentation filed: Visit Diagnoses, Charges VN, Orders

## 2012-07-21 ENCOUNTER — Telehealth: Payer: Self-pay | Admitting: Family Medicine

## 2012-07-21 NOTE — Telephone Encounter (Signed)
Pt wants Fleet Contras to call him about Dr. Tawanna Cooler taking him on as a diabetic pt? He did not elaborate.

## 2012-07-29 NOTE — Telephone Encounter (Signed)
Left message on machine returning patient's call 

## 2012-07-30 NOTE — Telephone Encounter (Signed)
Left message on machine returning patient's call 

## 2012-08-04 ENCOUNTER — Encounter: Payer: Self-pay | Admitting: Family Medicine

## 2012-08-04 ENCOUNTER — Ambulatory Visit (INDEPENDENT_AMBULATORY_CARE_PROVIDER_SITE_OTHER): Payer: Medicare Other | Admitting: Family Medicine

## 2012-08-04 VITALS — BP 130/80 | Temp 97.3°F | Wt 171.0 lb

## 2012-08-04 DIAGNOSIS — E109 Type 1 diabetes mellitus without complications: Secondary | ICD-10-CM

## 2012-08-04 DIAGNOSIS — E785 Hyperlipidemia, unspecified: Secondary | ICD-10-CM

## 2012-08-04 NOTE — Progress Notes (Signed)
  Subjective:    Patient ID: Dustin Vance, male    DOB: 12/17/1932, 78 y.o.   MRN: [id number]  HPI Dustin Vance is a 78 year old married male nonsmoker who comes in today accompanied by his wife who now is going to have to become his nurse and given his medications. He's not remembering his medication his hemoglobin A1c is 9.6%. His blood pressure is stable 130/80  His lipids were slightly elevated however with his blood sugar being elevated as to be expected.  He states since he had his cancer surgery his memory is not good  He's had no fever vomiting diarrhea or weight loss  Review of Systems    general and metabolic review of systems otherwise negative. His endocrinologist retired and he wishes to get his diabetes care here. He's also seen Tollie Eth nurse educator Objective:   Physical Exam Well-developed well-nourished male in no acute distress      Assessment & Plan:  Diabetes type 1 not at goal we'll outline a program to get him at goal followup every 2 weeks I think the main thing will be his wife will have to give him his medicine  Hypertension at goal continue current therapy  Lipid abnormality secondary to hyperglycemia continue lipid-lowering medication followup lipids panel when blood sugars normal

## 2012-08-04 NOTE — Patient Instructions (Signed)
Take 20 units of Lantus at bedtime  Continue your sliding scale of 6 units prior to each meal  Check a blood sugar in the morning and another blood sugar before his evening meal  Return in 2 weeks for followup

## 2012-08-26 ENCOUNTER — Encounter: Payer: Self-pay | Admitting: Family Medicine

## 2012-08-26 ENCOUNTER — Ambulatory Visit (INDEPENDENT_AMBULATORY_CARE_PROVIDER_SITE_OTHER): Payer: Medicare Other | Admitting: Family Medicine

## 2012-08-26 VITALS — BP 160/70 | Temp 98.2°F | Wt 172.0 lb

## 2012-08-26 DIAGNOSIS — E109 Type 1 diabetes mellitus without complications: Secondary | ICD-10-CM

## 2012-08-26 LAB — BASIC METABOLIC PANEL
BUN: 19 mg/dL (ref 6–23)
CO2: 28 mEq/L (ref 19–32)
Chloride: 105 mEq/L (ref 96–112)
Creatinine, Ser: 1.3 mg/dL (ref 0.4–1.5)

## 2012-08-26 LAB — MICROALBUMIN / CREATININE URINE RATIO: Creatinine,U: 211.4 mg/dL

## 2012-08-26 LAB — HEMOGLOBIN A1C: Hgb A1c MFr Bld: 9.5 % — ABNORMAL HIGH (ref 4.6–6.5)

## 2012-08-26 NOTE — Patient Instructions (Signed)
I will call you tomorrow and we will discuss how to manage your blood sugar depending on your A1c

## 2012-08-26 NOTE — Progress Notes (Signed)
  Subjective:    Patient ID: Dustin Vance, male    DOB: 1933-04-05, 78 y.o.   MRN: [id number]  HPI Dustin Vance is a 78 year old married male nonsmoker who comes in today for followup of diabetes type 1  He's currently on Lantus 10 units twice a day Humalog 6 units prior to each meal. His blood sugars range from 85-260 for no apparent reason. He's not had a hypoglycemic episode in over 3 weeks. Last A1c elevated 3 months ago   Review of Systems General and metabolic review of systems otherwise negative    Objective:   Physical Exam Well-developed well-nourished male in no acute distress       Assessment & Plan:  Diabetes type 1 recheck A1c change insulin if indicated

## 2012-09-15 ENCOUNTER — Ambulatory Visit: Payer: Medicare Other | Admitting: Family Medicine

## 2012-09-22 ENCOUNTER — Ambulatory Visit (INDEPENDENT_AMBULATORY_CARE_PROVIDER_SITE_OTHER): Payer: Medicare Other | Admitting: Family Medicine

## 2012-09-22 ENCOUNTER — Encounter: Payer: Self-pay | Admitting: Family Medicine

## 2012-09-22 VITALS — BP 160/80 | Temp 98.1°F | Wt 170.0 lb

## 2012-09-22 DIAGNOSIS — E109 Type 1 diabetes mellitus without complications: Secondary | ICD-10-CM

## 2012-09-22 NOTE — Patient Instructions (Signed)
Keep your sliding scale the same  Increase your morning Lantus to 15 units and continue the evening dose at 10 units  Check your blood sugar once daily in the morning  Return in 4 weeks for followup

## 2012-09-22 NOTE — Progress Notes (Signed)
  Subjective:    Patient ID: Dustin Vance, male    DOB: [birth year], 78 y.o.   MRN: [id number]  HPI Dustin Vance is a 78 year old male married nonsmoker who comes in today for followup of diabetes  He's on 10 units of Lantus twice a day and a sliding scale of insulin 10 units before breakfast 8 units before lunch and 6 units before his evening meal. His recent hemoglobin A1c was 9.5% microalbumin was 15% creatinine was 1.3 GFR was 58  We discussed various options. He's not walking on a regular basis at all. He does have some slight memory changes. His wife manages his medication    Review of Systems    general you systems otherwise negative Objective:   Physical Exam Well-developed thin male no acute distress       Assessment & Plan:  Diabetes type 1 not at goal plan continue sliding-scale but increase Lantus to 15 units in the morning continue the 10 units prior to evening meal followup in 4 weeks

## 2012-10-04 IMAGING — CR DG CHEST 2V
2 series · 2 of 2 positions shown · non-contrast
Comparison: 08/12/2011 study.

CLINICAL DATA: History of right lung infiltrative density.  Follow-
up.  Evaluate for pneumonia.  Previous smoker.

CHEST - 2 VIEW

[w chest pa]
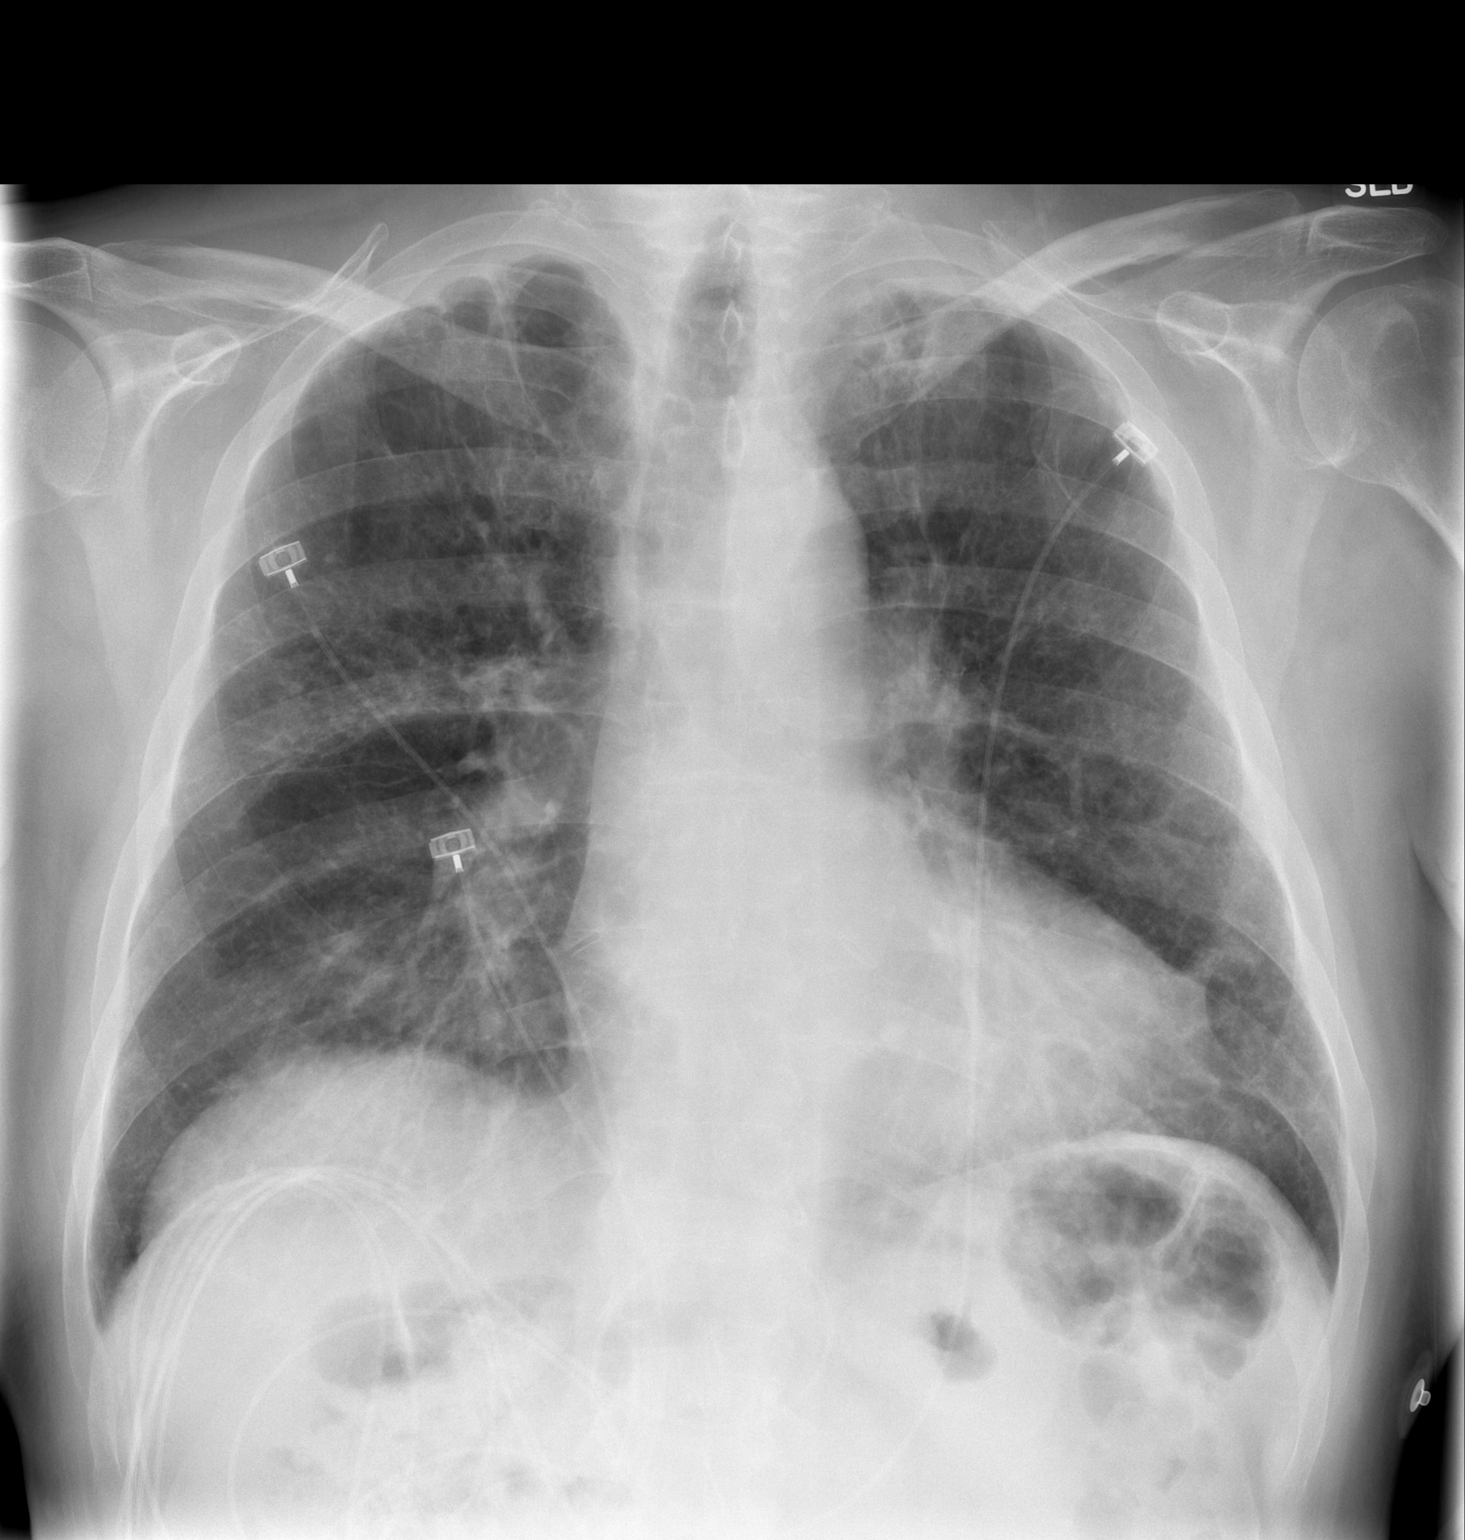

[w chest lat]
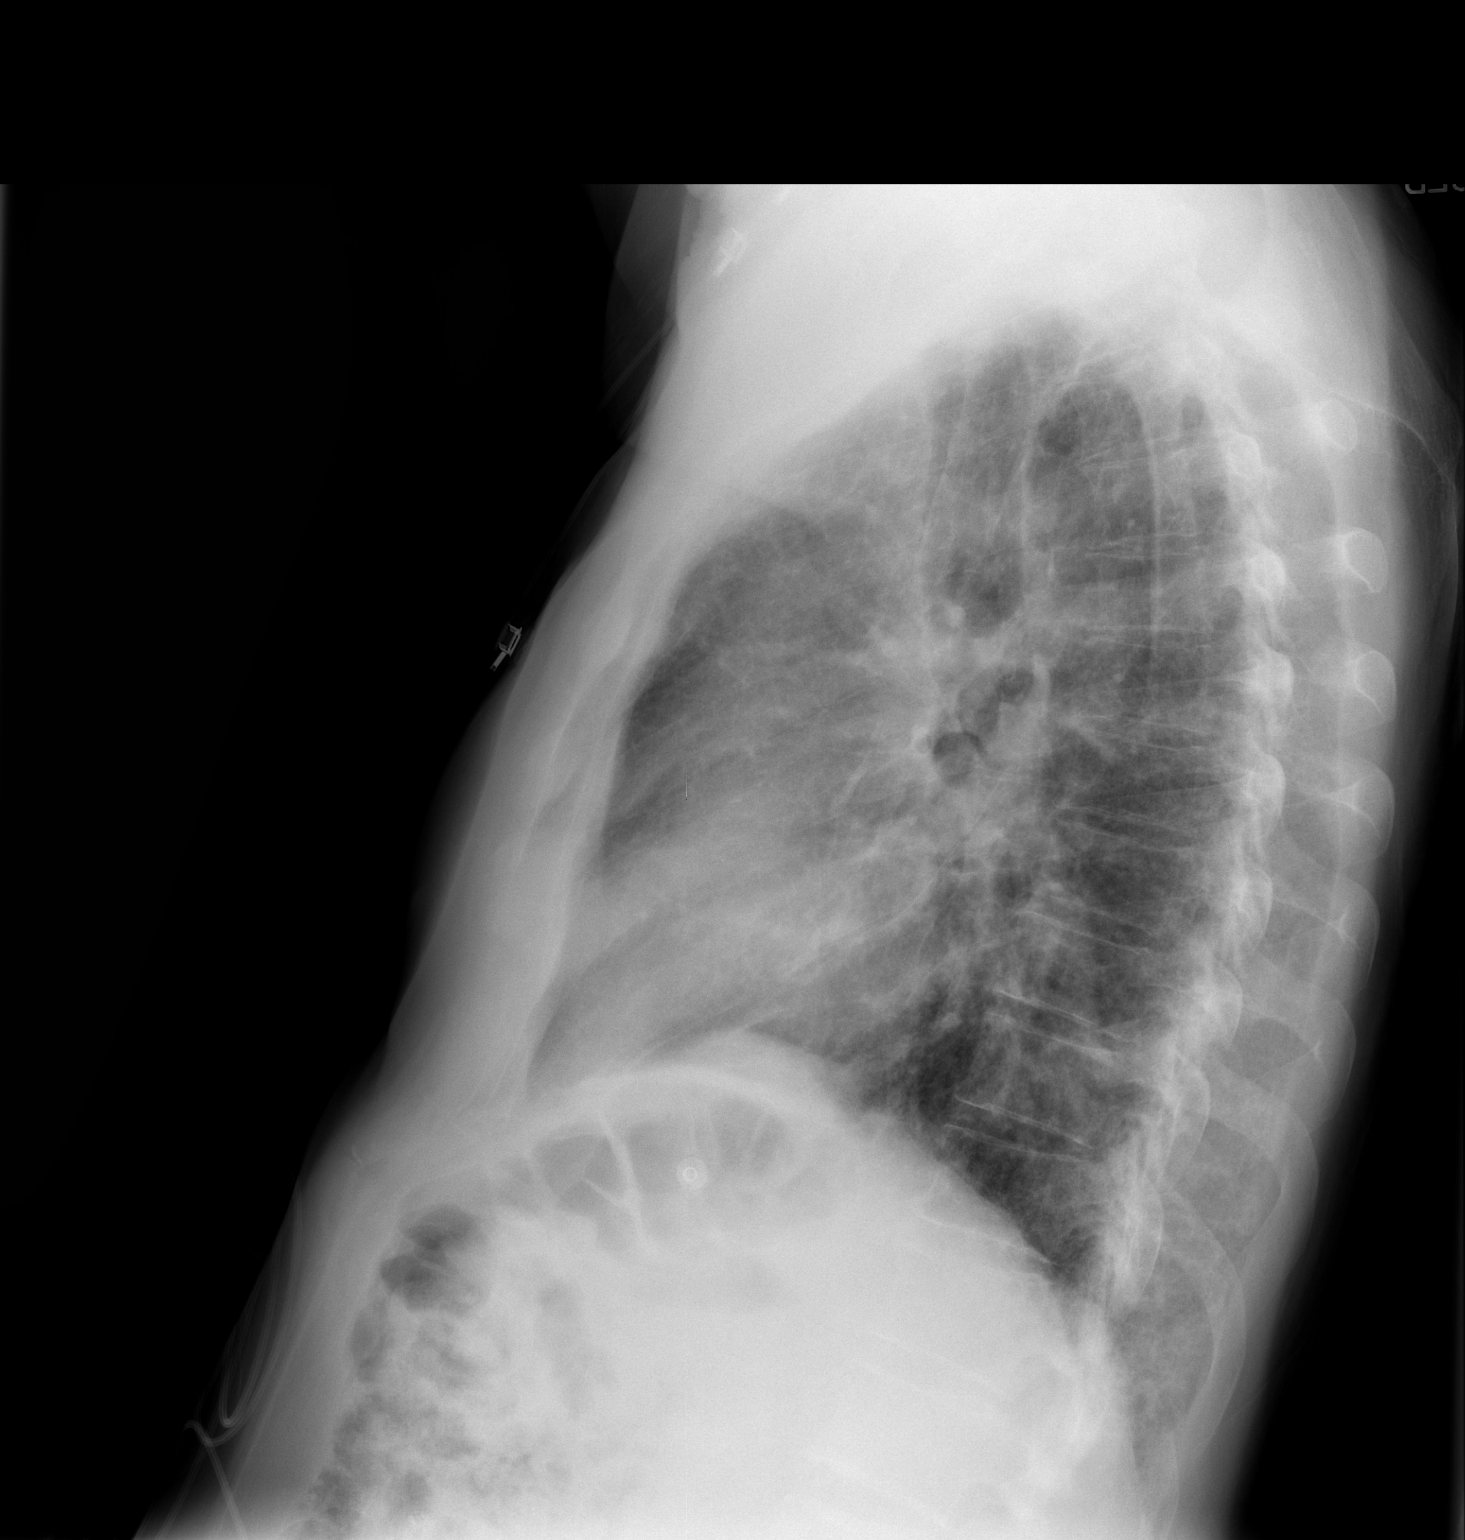

[2 of 2 positions shown; findings below may reference images not displayed]

FINDINGS: There is stable slight enlargement of the cardiac
silhouette.  Mediastinal and hilar contours appear stable.  There
is chronic prominence of the reticular interstitial markings.
There are apical fibrotic changes with associated noncalcific
pleural thickening.  The hazy infiltrative densities seen in the
right base on the previous study on the PA image is no longer
evident.  There is central peribronchial thickening.
IMPRESSION: No consolidation or pleural effusion is seen.  There is stable
slight enlargement of the cardiac silhouette.  Chronic prominence
of reticular interstitial markings is stable.  Apical pleural and
parenchymal fibrotic changes are stable. Fibrotic change in the
left base is stable.  Central peribronchial thickening is present.
There is improved aeration in the right base.  No definite acute
infiltrative density is seen in the right base.

## 2012-10-23 ENCOUNTER — Ambulatory Visit (INDEPENDENT_AMBULATORY_CARE_PROVIDER_SITE_OTHER): Payer: Medicare Other | Admitting: Family Medicine

## 2012-10-23 ENCOUNTER — Encounter: Payer: Self-pay | Admitting: Family Medicine

## 2012-10-23 VITALS — BP 120/80 | Temp 98.0°F | Wt 174.0 lb

## 2012-10-23 DIAGNOSIS — E109 Type 1 diabetes mellitus without complications: Secondary | ICD-10-CM

## 2012-10-23 NOTE — Patient Instructions (Signed)
Continue your current treatment program  Followup in 2 months  Nonfasting labs one week prior

## 2012-10-23 NOTE — Progress Notes (Signed)
  Subjective:    Patient ID: Dustin Vance, male    DOB: [birth year], 79 y.o.   MRN: [id number]  HPI Dustin Vance is a 79 year old male who comes in today accompanied by his wife for followup of diabetes type 1  We saw him about a month ago his A1c was up to 9.5. We've therefore changed his medication to 15 units of Lantus in the morning 10 units at bedtime and increased his sliding scale 10 units before breakfast 8 before lunch and 6 before his evening meal. This combination seems to work well his blood sugars have dropped in the 1:30 range. One night at 2:30 in the morning it dropped to 46. Looking back he had vegetables for dinner took his insulin and had no carbohydrates nor fat. Sometimes he forgets to take his insulin and his wife has to remind him   Review of Systems    general and metabolic review of systems otherwise negative except for the one episode of hypoglycemia Objective:   Physical Exam  Well-developed well-nourished thin male no acute distress     Assessment & Plan:  Diabetes type 1 approaching goal continue current therapy followup in 2 months A1c one week prior

## 2012-12-05 ENCOUNTER — Telehealth: Payer: Self-pay | Admitting: Family Medicine

## 2012-12-05 DIAGNOSIS — E785 Hyperlipidemia, unspecified: Secondary | ICD-10-CM

## 2012-12-05 MED ORDER — ROSUVASTATIN CALCIUM 5 MG PO TABS: 5.0000 mg | ORAL_TABLET | ORAL | Status: DC

## 2012-12-05 NOTE — Telephone Encounter (Signed)
Pt needs crestor 5 mg # 90 with 3 refills sent to [facility]. This med was prescribed by Dr Sharl Ma. Pt no longer sees Dr Sharl Ma

## 2012-12-05 NOTE — Telephone Encounter (Signed)
rx sent

## 2012-12-09 ENCOUNTER — Encounter: Payer: Self-pay | Admitting: Vascular Surgery

## 2012-12-10 ENCOUNTER — Encounter (INDEPENDENT_AMBULATORY_CARE_PROVIDER_SITE_OTHER): Payer: Medicare Other | Admitting: Vascular Surgery

## 2012-12-10 ENCOUNTER — Encounter: Payer: Self-pay | Admitting: Vascular Surgery

## 2012-12-10 ENCOUNTER — Ambulatory Visit (INDEPENDENT_AMBULATORY_CARE_PROVIDER_SITE_OTHER): Payer: Medicare Other | Admitting: Vascular Surgery

## 2012-12-10 VITALS — BP 154/92 | HR 76 | Ht 71.0 in | Wt 170.2 lb

## 2012-12-10 DIAGNOSIS — Z48812 Encounter for surgical aftercare following surgery on the circulatory system: Secondary | ICD-10-CM

## 2012-12-10 DIAGNOSIS — I724 Aneurysm of artery of lower extremity: Secondary | ICD-10-CM

## 2012-12-10 DIAGNOSIS — I714 Abdominal aortic aneurysm, without rupture: Secondary | ICD-10-CM

## 2012-12-10 NOTE — Progress Notes (Signed)
[facility] [place]   Progress note  Date of Surgery: AAA repair 2005 Right Pop aneurysm repair 2004*   History of Present Illness  Dustin Vance is a 79 y.o. male who is  up s/p AAA and right popliteal aneurysm repair. He is doing well and his leg cramps have resolved with taking turine OTC med.  Pt is doing well. denies incisional pain or hernia; he denies any claudication symptoms.  Physical Examination BP Readings from Last 3 Encounters:  12/10/12 154/92  10/23/12 120/80  09/22/12 160/80   Temp Readings from Last 3 Encounters:  10/23/12 98 F (36.7 C) Oral  09/22/12 98.1 F (36.7 C) Oral  08/26/12 98.2 F (36.8 C) Oral   SpO2 Readings from Last 3 Encounters:  12/10/12 100%  07/04/12 98%  12/05/11 98%   Pulse Readings from Last 3 Encounters:  12/10/12 76  07/04/12 70  03/28/12 70    General: A&O x 3, WDWN male in NAD Pulmonary: normal non-labored breathing  Abdomen:abdomen soft and non-tender  Abdominal wound:clean, dry, intact  No evidence of left POPliteal aneurysm Vascular Exam:BLE warm and well perfused Extremities without ischemic changes, no Gangrene, no cellulitis; no open wounds;   LOWER EXTREMITY PULSES           RIGHT                                      LEFT      POSTERIOR TIBIAL  palpable  palpable       DORSALIS PEDIS      ANTERIOR TIBIAL  palpable  palpable    Non-Invasive Vascular Imaging 12/10/2012  ABI'S: Right 1.26; Left 1.24 Stable exam  Assessment/Plan: [name] is a 79 y.o. male who is several years S/P AAA and right pop art aneurysm repair. ABI's normal with corresponding palp distal pulses No evidence left pop aneurysm F/U 1 year with ABI's and duplex scan fo graft      Marlowe Shores [phone] 12/10/2012 2:53 PM  Clinic MD: CSD  Agree with above. Right lower extremity bypass graft patent. We'll see him back in 6 months with follow up studies. He knows to call sooner if he has  problems.  Cari Caraway Beeper [phone] 12/10/2012

## 2012-12-11 NOTE — Addendum Note (Signed)
Addended by: Sharee Pimple on: 12/11/2012 08:16 AM   Modules accepted: Orders

## 2012-12-12 ENCOUNTER — Other Ambulatory Visit: Payer: Self-pay | Admitting: Family Medicine

## 2012-12-12 DIAGNOSIS — E109 Type 1 diabetes mellitus without complications: Secondary | ICD-10-CM

## 2012-12-15 ENCOUNTER — Other Ambulatory Visit: Payer: Medicare Other

## 2012-12-16 ENCOUNTER — Other Ambulatory Visit: Payer: Self-pay | Admitting: *Deleted

## 2012-12-16 MED ORDER — ROSUVASTATIN CALCIUM 5 MG PO TABS: 5.0000 mg | ORAL_TABLET | Freq: Every day | ORAL | Status: DC

## 2012-12-16 NOTE — Telephone Encounter (Signed)
Refills not received. New rx sent

## 2012-12-19 ENCOUNTER — Telehealth: Payer: Self-pay | Admitting: *Deleted

## 2012-12-19 NOTE — Telephone Encounter (Signed)
CALLED PATIENT TO ALTER FU VISIT FOR 01-01-13, DUE TO DR. MOODY BEING ON VACATION, RESCHEDULED FOR 01-08-13 AT 2:00 PM, SPOKE WITH PATIENT'S WIFE AND SHE IS AWARE OF THIS CHANGE AND O.K. WITH IT.

## 2012-12-22 ENCOUNTER — Ambulatory Visit: Payer: Medicare Other | Admitting: Family Medicine

## 2012-12-22 ENCOUNTER — Other Ambulatory Visit (INDEPENDENT_AMBULATORY_CARE_PROVIDER_SITE_OTHER): Payer: Medicare Other

## 2012-12-22 DIAGNOSIS — E109 Type 1 diabetes mellitus without complications: Secondary | ICD-10-CM

## 2012-12-22 DIAGNOSIS — I10 Essential (primary) hypertension: Secondary | ICD-10-CM

## 2012-12-22 LAB — BASIC METABOLIC PANEL
BUN: 18 mg/dL (ref 6–23)
CO2: 28 mEq/L (ref 19–32)
Calcium: 8.7 mg/dL (ref 8.4–10.5)
Creatinine, Ser: 1.3 mg/dL (ref 0.4–1.5)
GFR: 55.37 mL/min — ABNORMAL LOW (ref >60.00)
Glucose, Bld: 152 mg/dL — ABNORMAL HIGH (ref 70–99)
Sodium: 141 mEq/L (ref 135–145)

## 2012-12-22 LAB — MICROALBUMIN / CREATININE URINE RATIO: Creatinine,U: 115.8 mg/dL

## 2013-01-01 ENCOUNTER — Ambulatory Visit: Payer: Medicare Other | Admitting: Radiation Oncology

## 2013-01-08 ENCOUNTER — Ambulatory Visit: Payer: Medicare Other | Admitting: Family Medicine

## 2013-01-08 ENCOUNTER — Ambulatory Visit
Admission: RE | Admit: 2013-01-08 | Discharge: 2013-01-08 | Disposition: A | Discharge status: Home / Self Care | Payer: Medicare Other | Source: Ambulatory Visit | Attending: Radiation Oncology | Admitting: Radiation Oncology

## 2013-01-08 VITALS — BP 149/74 | HR 71 | Temp 97.2°F | Resp 20 | Wt 173.9 lb

## 2013-01-08 DIAGNOSIS — C024 Malignant neoplasm of lingual tonsil: Secondary | ICD-10-CM

## 2013-01-08 DIAGNOSIS — C109 Malignant neoplasm of oropharynx, unspecified: Secondary | ICD-10-CM | POA: Insufficient documentation

## 2013-01-08 MED ORDER — LARYNGOSCOPY SOLUTION RAD-ONC
15.0000 mL | Freq: Once | TOPICAL | Status: AC
  Administered 2013-01-08: 15 mL via TOPICAL
  Filled 2013-01-08: qty 15

## 2013-01-08 NOTE — Progress Notes (Signed)
  Radiation Oncology         [phone] ________________________________  Name: Dustin Vance MRN: [id number]  Date: 01/08/2013  DOB: 1933/02/14  Follow-Up Visit Note  CC: TODD,JEFFREY Freida Busman, MD  Exie Parody, MD  Diagnosis:   Squamous cell carcinoma of the oropharynx,T2N2cM0  Interval Since Last Radiation:  2-1/2 years   Narrative:  The patient returns today for routine follow-up.  The patient states that he is done fairly well. No significant increase in submental swelling. He does note some xerostomia but states that this has continued to slowly improve. He does occasionally have some foods stick in his throat but this is fairly rare. He indicates that he is really able that he almost everything that he likes. No worsening dysphasia therefore for Dina fascia.                              ALLERGIES:  has No Known Allergies.  Meds: Current Outpatient Prescriptions  Medication Sig Dispense Refill  . aspirin 81 MG tablet Take 81 mg by mouth daily.        . Coenzyme Q10 (COQ10) 100 MG CAPS Take by mouth daily.        [name] donepezil (ARICEPT) 5 MG tablet Take 2 tablets (10 mg total) by mouth at bedtime as needed.  100 tablet  3  . insulin glargine (LANTUS) 100 UNIT/ML injection Inject 10 Units into the skin. 15 units AM and 10 units PM      . insulin lispro (HUMALOG) 100 UNIT/ML injection 10 in AM, 8 noon, 6 pm      . Multiple Vitamin (MULTIVITAMIN PO) Take by mouth daily.        . rosuvastatin (CRESTOR) 5 MG tablet Take 1 tablet (5 mg total) by mouth daily.  90 tablet  3  . polyethylene glycol powder (GLYCOLAX/MIRALAX) powder        No current facility-administered medications for this encounter.    Physical Findings: The patient is in no acute distress. Patient is alert and oriented.  weight is 173 lb 14.4 oz (78.881 kg). His temperature is 97.2 F (36.2 C). His blood pressure is 149/74 and his pulse is 71. His respiration is 20 and oxygen saturation is 100%. .   General:  Well-developed, in no acute distress HEENT: Normocephalic, atraumatic; the neck shows some radiation change but the skin is healed nicely, no palpable cervical adenopathy; oral cavity and oropharynx clear on visual inspection Cardiovascular: Regular rate and rhythm Respiratory: Clear to auscultation bilaterally GI: Soft, nontender, normal bowel sounds Extremities: No edema present  Fiberoptic exam: After the use of topical anesthetic, the flexible laryngoscope was passed through the right nare. Good visualization was obtained. No lesions or suspicious findings within the larynx, hypopharynx, oropharynx or nasopharynx. Some swelling present in the laryngeal area secondary to radiation effect    Lab Findings: Lab Results  Component Value Date   WBC 5.0 03/26/2012   HGB 12.0* 03/26/2012   HCT 36.3* 03/26/2012   MCV 93.8 03/26/2012   PLT 204 03/26/2012     Radiographic Findings: No results found.  Impression:    The patient is doing well clinically at this time and remains clinically NED. We will continue ongoing followup  Plan:  The patient is to return to clinic in 6 months.   Radene Gunning, M.D., Ph.D.

## 2013-01-08 NOTE — Addendum Note (Signed)
Encounter addended by: Tessa Lerner, RN on: 01/08/2013  4:05 PM<BR>     Documentation filed: Inpatient MAR

## 2013-01-08 NOTE — Addendum Note (Signed)
Encounter addended by: Richardean Sale, [facility] on: 01/08/2013  3:54 PM<BR>     Documentation filed: Rx Order Verification

## 2013-01-08 NOTE — Addendum Note (Signed)
Encounter addended by: Tessa Lerner, RN on: 01/08/2013  3:47 PM<BR>     Documentation filed: Charges VN, Orders

## 2013-01-08 NOTE — Progress Notes (Signed)
Patient here for routine follow up  Radiation to oropharynx.Denies pain or swallowing difficulty.Seen by Dr. Haroldine Laws October 2013.Called for notes.No scans since 03/26/2012.

## 2013-02-02 ENCOUNTER — Ambulatory Visit: Payer: Medicare Other | Admitting: Family Medicine

## 2013-02-25 ENCOUNTER — Encounter: Payer: Self-pay | Admitting: Family Medicine

## 2013-02-25 ENCOUNTER — Ambulatory Visit (INDEPENDENT_AMBULATORY_CARE_PROVIDER_SITE_OTHER): Payer: Medicare Other | Admitting: Family Medicine

## 2013-02-25 VITALS — BP 120/78 | Temp 98.3°F | Ht 71.0 in | Wt 176.0 lb

## 2013-02-25 DIAGNOSIS — E109 Type 1 diabetes mellitus without complications: Secondary | ICD-10-CM

## 2013-02-25 NOTE — Patient Instructions (Signed)
Lantus 40 units with evening meal  Blood sugar daily in the morning  Followup in 3 weeks

## 2013-02-25 NOTE — Progress Notes (Signed)
  Subjective:    Patient ID: NORMAL RECINOS, male    DOB: 07/03/1933, 79 y.o.   MRN: [id number]  HPI Dustin Vance. Is a 79 year old married male who comes in today for followup of diabetes  We have tried different combinations of medication did get his blood sugar normal. His recent blood sugars in the 150-200 range. His A1c was 9.5. In reviewing his medication he's not taking his insulin prior to meals as we've outlined.   Review of Systems Review of systems negative    Objective:   Physical Exam Well-developed and nourished male no acute distress       Assessment & Plan:  Diabetes type 1 not at goal plan,,,,,,,,,, Lantus 40 units once a day at bedtime fasting blood sugars daily in the morning followup in 3 weeks

## 2013-03-13 ENCOUNTER — Other Ambulatory Visit: Payer: Self-pay | Admitting: *Deleted

## 2013-03-13 DIAGNOSIS — C109 Malignant neoplasm of oropharynx, unspecified: Secondary | ICD-10-CM

## 2013-03-13 MED ORDER — INSULIN GLARGINE 100 UNIT/ML ~~LOC~~ SOLN: SUBCUTANEOUS | Status: DC

## 2013-03-24 ENCOUNTER — Encounter: Payer: Self-pay | Admitting: Neurology

## 2013-03-25 ENCOUNTER — Ambulatory Visit (INDEPENDENT_AMBULATORY_CARE_PROVIDER_SITE_OTHER): Payer: Medicare Other | Admitting: Family Medicine

## 2013-03-25 ENCOUNTER — Encounter: Payer: Self-pay | Admitting: Family Medicine

## 2013-03-25 VITALS — BP 130/80 | Temp 98.1°F | Wt 175.0 lb

## 2013-03-25 DIAGNOSIS — E109 Type 1 diabetes mellitus without complications: Secondary | ICD-10-CM

## 2013-03-25 NOTE — Patient Instructions (Signed)
Lantus 40 units daily at bedtime  Sliding scale with the Humalog 6 units before breakfast, 6 units before lunch, and 12 units before dinner,,,,,,,,,, except if he eats extra dessert given 20 units of insulin before dinner  Fasting blood sugar daily in the morning followup in one month

## 2013-03-25 NOTE — Progress Notes (Signed)
  Subjective:    Patient ID: Dustin Vance, male    DOB: 28-Aug-1933, 79 y.o.   MRN: [id number]  HPI Dustin Vance is a 79 year old male who comes in today accompanied by his wife for evaluation of diabetes type 1  We've tried different combinations of long-acting and short-acting however he still runs sugars that vary between 70 and 350. After further discussion he admits to not eating the same number of calories and he'll oftentimes doubled asserts. His wife says sometimes her get up at 3:00 in the morning and 8   Review of Systems    review of systems otherwise negative Objective:   Physical Exam  Well-developed well-nourished male no acute distress vital signs stable BP 130/80,,,,,,,,,,review of his blood sugar log says of old wide variation between 70 and 350      Assessment & Plan:  Diabetes type 1 not at goal we'll readjust insulin and give extra regular when he eats more

## 2013-03-30 ENCOUNTER — Ambulatory Visit (HOSPITAL_BASED_OUTPATIENT_CLINIC_OR_DEPARTMENT_OTHER): Payer: Medicare Other | Admitting: Oncology

## 2013-03-30 ENCOUNTER — Telehealth: Payer: Self-pay | Admitting: Oncology

## 2013-03-30 ENCOUNTER — Encounter: Payer: Self-pay | Admitting: Oncology

## 2013-03-30 ENCOUNTER — Other Ambulatory Visit (HOSPITAL_BASED_OUTPATIENT_CLINIC_OR_DEPARTMENT_OTHER): Payer: Medicare Other | Admitting: Lab

## 2013-03-30 VITALS — BP 144/60 | HR 66 | Temp 98.2°F | Resp 18 | Ht 71.0 in | Wt 174.0 lb

## 2013-03-30 DIAGNOSIS — C109 Malignant neoplasm of oropharynx, unspecified: Secondary | ICD-10-CM

## 2013-03-30 LAB — CBC WITH DIFFERENTIAL/PLATELET
Basophils Absolute: 0 10*3/uL (ref 0.0–0.1)
Eosinophils Absolute: 0.1 10*3/uL (ref 0.0–0.5)
HCT: 38 % — ABNORMAL LOW (ref 38.4–49.9)
HGB: 12.3 g/dL — ABNORMAL LOW (ref 13.0–17.1)
MCV: 93.1 fL (ref 79.3–98.0)
MONO%: 6.8 % (ref 0.0–14.0)
NEUT#: 5.1 10*3/uL (ref 1.5–6.5)
NEUT%: 80.2 % — ABNORMAL HIGH (ref 39.0–75.0)
RDW: 13.8 % (ref 11.0–14.6)

## 2013-03-30 LAB — COMPREHENSIVE METABOLIC PANEL (CC13)
AST: 14 U/L (ref 5–34)
Alkaline Phosphatase: 89 U/L (ref 40–150)
BUN: 18.5 mg/dL (ref 7.0–26.0)
Creatinine: 1.5 mg/dL — ABNORMAL HIGH (ref 0.7–1.3)
Potassium: 3.9 mEq/L (ref 3.5–5.1)

## 2013-03-30 NOTE — Telephone Encounter (Signed)
gv and printed appt sched and avs of pt

## 2013-03-30 NOTE — Progress Notes (Signed)
[facility]  [phone] [phone]   OFFICE PROGRESS NOTE   Cc:  TODD,JEFFREY ALLEN, MD  DIAGNOSIS:A T2N2cM0 squamous cell carcinoma of the right and possibly left tonsillar fossa, with history of smoking, however, also HPV positive.   PAST THERAPY: concurrent definitive chemoXRT with weekly Carboplatin/Taxol and daily XRT started on 05/31/10 finished in Oct 2011.   CURRENT THERAPY: watchful observation.  INTERVAL HISTORY: Dustin Vance 79 y.o. male returns for regular follow up with his wife.  He still has xerostomia; however this is improved compared to before. He thinks his strength is almost completely recovered him. He is independent of all activities of daily living. He still has dysphagia with dried foods such as breads or chicken. Swelling in the submental area has resolved. He denies any palpable lymph node bilaterally and his neck. His wife think that his memory is stable since last year.  He said that his short-term memory is still a problem. However his long-term memory is quite intact.  He has been able to drive for short distance without any loss lately. He denies smoking cigarettes, chewing tobacco, taking alcohol.  Patient denies fatigue, headache, visual changes, confusion, drenching night sweats, palpable lymph node swelling, mucositis, odynophagia, dysphagia, nausea vomiting, jaundice, chest pain, palpitation, shortness of breath, dyspnea on exertion, productive cough, gum bleeding, epistaxis, hematemesis, hemoptysis, abdominal pain, abdominal swelling, early satiety, melena, hematochezia, hematuria, skin rash, spontaneous bleeding, joint swelling, joint pain, heat or cold intolerance, bowel bladder incontinence, back pain, focal motor weakness, paresthesia, depression, suicidal or homocidal ideation, feeling hopelessness.   Past Medical History  Diagnosis Date  . Hyperlipidemia   . AAA (abdominal aortic aneurysm)     status post  repair in 2005  . Hx of colonic polyps   . CKD (chronic kidney disease)     last creatinine   . Oropharynx cancer   . Cancer 04/28/10    R base of tongue, inv squamous cell, HPV +  . Diabetes mellitus type I   . Hypertension   . COPD (chronic obstructive pulmonary disease)   . Hx of radiation therapy 06/01/10 to 07/21/10    R base of tongue  . Anemia     mild/chemo-induced    Past Surgical History  Procedure Laterality Date  . Tonsillectomy    . Colonoscopy w/ polypectomy    . Hemorrhoid surgery    . Inguinal hernia repair    . Penial implant    . Rotator cuff repair    . Appendectomy    . Abdominal aortic aneurysm repair      Current Outpatient Prescriptions  Medication Sig Dispense Refill  . ACCU-CHEK AVIVA PLUS test strip       . aspirin 81 MG tablet Take 81 mg by mouth daily.        . Coenzyme Q10 (COQ10) 100 MG CAPS Take by mouth daily.        [name] donepezil (ARICEPT) 5 MG tablet Take 2 tablets (10 mg total) by mouth at bedtime as needed.  100 tablet  3  . insulin glargine (LANTUS) 100 UNIT/ML injection Inject 40 Units into the skin at bedtime.      . insulin lispro (HUMALOG) 100 UNIT/ML injection Inject 5 Units into the skin every morning.       . Multiple Vitamin (MULTIVITAMIN PO) Take by mouth daily.        . polyethylene glycol powder (GLYCOLAX/MIRALAX) powder       . rosuvastatin (CRESTOR)  5 MG tablet Take 1 tablet (5 mg total) by mouth daily.  90 tablet  3   No current facility-administered medications for this visit.    ALLERGIES:  has No Known Allergies.  REVIEW OF SYSTEMS:  The rest of the 14-point review of system was negative.   Filed Vitals:   03/30/13 1057  BP: 144/60  Pulse: 66  Temp: 98.2 F (36.8 C)  Resp: 18   Wt Readings from Last 3 Encounters:  03/30/13 174 lb (78.926 kg)  03/25/13 175 lb (79.379 kg)  02/25/13 176 lb (79.833 kg)   ECOG Performance status: 1  PHYSICAL EXAMINATION:   General:  well-nourished man, in no acute distress.   Eyes:  no scleral icterus.  ENT:  There were no oropharyngeal lesions.  Neck was without thyromegaly.  There was submental edema without discrete palpable mass. Lymphatics:  Negative cervical, supraclavicular or axillary adenopathy.  Respiratory: lungs were clear bilaterally without wheezing or crackles.  Cardiovascular:  Regular rate and rhythm, S1/S2, without murmur, rub or gallop.  There was no pedal edema.  GI:  abdomen was soft, flat, nontender, nondistended, without organomegaly.  Muscoloskeletal:  no spinal tenderness of palpation of vertebral spine.  Skin exam was without echymosis, petichae.  Neuro exam was nonfocal.  Patient was able to get on and off exam table without assistance.  Gait was normal.  Patient was alerted and oriented.  Attention was good.   Language was appropriate.  Mood was normal without depression.  Speech was not pressured.  Thought content was not tangential.      LABORATORY/RADIOLOGY DATA:  Lab Results  Component Value Date   WBC 6.3 03/30/2013   HGB 12.3* 03/30/2013   HCT 38.0* 03/30/2013   PLT 202 03/30/2013   GLUCOSE 161* 03/30/2013   CHOL 172 04/01/2012   TRIG 114.0 04/01/2012   HDL 17.90* 04/01/2012   LDLDIRECT 176.4 01/30/2011   LDLCALC 131* 04/01/2012   ALKPHOS 89 03/30/2013   ALT 12 03/30/2013   AST 14 03/30/2013   NA 143 03/30/2013   K 3.9 03/30/2013   CL 108* 03/30/2013   CREATININE 1.5* 03/30/2013   BUN 18.5 03/30/2013   CO2 26 03/30/2013   PSA 0.38 01/30/2011   INR 1.02 08/12/2011   HGBA1C 9.5* 12/22/2012   MICROALBUR 11.6* 12/22/2012     ASSESSMENT AND PLAN:   1. History of oropharyngeal squamous cell carcinoma: no evidence of recurrence per clinical history, physical exam, neck CT. I recommend continued observation. He has not been smoking cigarettes or using alcohol for many years.There is no indication for routine neck CT as you're two years out from the finish of therapy.  I advised him to continue to follow up with ENT and Radiation Oncologist who can  perform examination of his throat.  If there is anything abnormal on exam with ENT and Radiation Oncologist, of course we can repeat scan again.  2. Mild anemia: This is secondary to history of chemotherapy and anemia of chronic disease. There is no active bleeding. There is no need for transfusion.  3. Baseline dementia: short term memory loss.  He continues to improve. He is on Aricept per PCP. 4. Diabetes mellitus, type II: He is on insulin per PCP. 5. Hyperlipidemia: He is on rosuvastatin per PCP. 6. Hypertension: Diet-controlled at this time. 7. Chronic kidney disease: Creatinine is slightly elevated. Most likely, this is due to HTN and DM. No indication for further work up unless significantly worsened.  8. COPD: Stable without  intervention. 9. History of smoking: none at this time.  10. Submental edema from treatment:  Resolved.  11. Follow up:  In about 1 year.      The length of time of the face-to-face encounter was 15 minutes. More than 50% of time was spent counseling and coordination of care.

## 2013-04-02 ENCOUNTER — Encounter: Payer: Medicare Other | Admitting: Family Medicine

## 2013-04-08 ENCOUNTER — Ambulatory Visit (INDEPENDENT_AMBULATORY_CARE_PROVIDER_SITE_OTHER): Payer: Medicare Other | Admitting: Neurology

## 2013-04-08 ENCOUNTER — Encounter: Payer: Self-pay | Admitting: Neurology

## 2013-04-08 VITALS — BP 134/65 | HR 72 | Temp 97.0°F | Ht 71.0 in | Wt 172.0 lb

## 2013-04-08 DIAGNOSIS — R413 Other amnesia: Secondary | ICD-10-CM

## 2013-04-08 DIAGNOSIS — G609 Hereditary and idiopathic neuropathy, unspecified: Secondary | ICD-10-CM

## 2013-04-08 NOTE — Progress Notes (Signed)
Subjective:    Patient ID: Dustin Vance is a 79 y.o. male.  HPI  Interim history:   Dustin Vance is a very pleasant 79 year old right-handed gentleman who presents for followup consultation of his memory loss. The patient is accompanied by his wife today. This is his first visit with me and he previously followed with Dr. Avie Echevaria and was last seen by him on 09/18/2012, at which time Dr. [name] felt the patient was stable and doing well. His MMSE was improved and he was encouraged to exercise. He has an underlying medical history of diabetes, oral pharyngeal carcinoma, status post radiation therapy as well as chemotherapy in August and October 2011. He has diabetes, hyperlipidemia, hypertension and had an isolated syncopal episode. He is currently on Crestor, Aricept, multivitamin, coenzyme Q 10, Lantus and Humalog insulin, baby aspirin.  I reviewed Dr. Imagene Gurney prior notes and the patient's records and below is a summary of that review:  79 year old right-handed gentleman with a approximately 4 year history of forgetfulness and memory loss. There is no family history of dementia. There is no personal history of head trauma, substance abuse. He has been diabetic for many years. In April 2012 his MMSE was 28, his brain MRI had shown. Sylvian, mesial temporal and diffuse atrophy with enlarged ventricles and mild to moderate chronic small vessel disease. B12 level was 302 and RPR was negative. In August 2012 his MMSE was 27, clock drawing was 4, animal fluency was 12. In June 2013 his MMSE was 27, clock drawing was 3, animal fluency was 15. In December 2013 his MMSE was 30, clock drawing was 4, animal fluency was 11.  He feels stable and his wife concurs. He has had some mood irritability but no major changes to his personality or behavioral issues. He does have trouble maintaining his blood glucose levels. As far as his hemoglobin A1c was around 9 as far as his wife remembers. He is due for a checkup  later this month. He denies any hallucinations or paranoia. He denies any disorientation or confusion.  His Past Medical History Is Significant For: Past Medical History  Diagnosis Date  . Hyperlipidemia   . AAA (abdominal aortic aneurysm)     status post repair in 2005  . Hx of colonic polyps   . CKD (chronic kidney disease)     last creatinine   . Oropharynx cancer   . Cancer 04/28/10    R base of tongue, inv squamous cell, HPV +  . Diabetes mellitus type I   . Hypertension   . COPD (chronic obstructive pulmonary disease)   . Hx of radiation therapy 06/01/10 to 07/21/10    R base of tongue  . Anemia     mild/chemo-induced   His Past Surgical History Is Significant For: Past Surgical History  Procedure Laterality Date  . Tonsillectomy    . Colonoscopy w/ polypectomy    . Hemorrhoid surgery    . Inguinal hernia repair    . Penial implant    . Rotator cuff repair    . Appendectomy    . Abdominal aortic aneurysm repair      His Family History Is Significant For: Family History  Problem Relation Age of Onset  . Cancer Other   . Stroke Other   . Cancer Mother     unknown type  . Cancer Sister     lung  . Cancer Sister     brain    His Social History Is  Significant For: History   Social History  . Marital Status: Married    Spouse Name: N/A    Number of Children: 3  . Years of Education: N/A   Occupational History  . Retired    Social History Main Topics  . Smoking status: Former Smoker -- 1.00 packs/day for 50 years    Types: Cigarettes    Quit date: 01/29/2009  . Smokeless tobacco: None  . Alcohol Use: No  . Drug Use: No  . Sexually Active: None   Other Topics Concern  . None   Social History Narrative  . None    His Allergies Are:  No Known Allergies:   His Current Medications Are:  Outpatient Encounter Prescriptions as of 04/08/2013  Medication Sig Dispense Refill  . ACCU-CHEK AVIVA PLUS test strip       . aspirin 81 MG tablet Take 81 mg  by mouth daily.        . Coenzyme Q10 (COQ10) 100 MG CAPS Take by mouth daily.        [name] donepezil (ARICEPT) 5 MG tablet Take 2 tablets (10 mg total) by mouth at bedtime as needed.  100 tablet  3  . insulin glargine (LANTUS) 100 UNIT/ML injection Inject 40 Units into the skin at bedtime.      . insulin lispro (HUMALOG) 100 UNIT/ML injection Inject 5 Units into the skin every morning.       . Multiple Vitamin (MULTIVITAMIN PO) Take by mouth daily.        . polyethylene glycol powder (GLYCOLAX/MIRALAX) powder       . rosuvastatin (CRESTOR) 5 MG tablet Take 1 tablet (5 mg total) by mouth daily.  90 tablet  3   No facility-administered encounter medications on file as of 04/08/2013.   Review of Systems  All other systems reviewed and are negative.    Objective:  Neurologic Exam  Physical Exam Physical Examination:   Filed Vitals:   04/08/13 0946  BP: 134/65  Pulse: 72  Temp: 97 F (36.1 C)    General Examination: The patient is a very pleasant 79 y.o. male in no acute distress. He is calm and cooperative with the exam. He denies Auditory Hallucinations and Visual Hallucinations.   HEENT: Normocephalic, atraumatic, pupils are equal, round and reactive to light and accommodation. Funduscopic exam is normal with sharp disc margins noted. Extraocular tracking shows mild saccadic breakdown without nystagmus noted. Hearing is intact. Tympanic membranes are clear bilaterally. Face is symmetric with no facial masking and normal facial sensation. There is no lip, neck or jaw tremor. Neck is not rigid with intact passive ROM. There are no carotid bruits on auscultation. Oropharynx exam reveals mild mouth dryness. No significant airway crowding is noted. Mallampati is class II. Tongue protrudes centrally and palate elevates symmetrically. He is status post chemo and radiation.    Chest: is clear to auscultation without wheezing, rhonchi or crackles noted.  Heart: sounds are regular and normal  without murmurs, rubs or gallops noted.   Abdomen: is soft, non-tender and non-distended with normal bowel sounds appreciated on auscultation.  Extremities: There is trace pitting edema in the distal lower extremities bilaterally, around the ankles. Pedal pulses are intact.  Skin: is warm and dry with no trophic changes noted.  Musculoskeletal: exam reveals no obvious joint deformities, tenderness or joint swelling or erythema.  Neurologically:  Mental status: The patient is awake and alert, paying good  attention. He is able to provide the history. His  wife provides details. He is oriented to: person, place, time/date, situation, day of week, month of year and year. His memory, attention, language and knowledge are impaired very mildly. There is no aphasia, agnosia, apraxia or anomia. There is a no degree of bradyphrenia. Speech is mildly hypophonic with mild dysarthria noted, d/t cancer treatment. Mood is congruent and affect is normal.  His MMSE score is 29/30, he loses one point on serial sevens. CDT is 4/4. AFT (Animal Fluency Test) score is 10.   Cranial nerves are as described above under HEENT exam. In addition, shoulder shrug is normal with equal shoulder height noted.  Motor exam: Normal bulk, and strength for age is noted. Tone is Not rigid with absence of cogwheeling. There is overall no bradykinesia. There is no drift or rebound. There is no tremor.   Romberg is negative. Reflexes are 2+ in the upper extremities and 2+ in the lower extremities. Toes are downgoing bilaterally. Fine motor skills: Finger taps, hand movements, and rapid alternating patting are not impaired bilaterally. Foot taps and foot agility are not impaired bilaterally.   Cerebellar testing shows no dysmetria or intention tremor on finger to nose testing. Heel to shin is unremarkable. There is no truncal or gait ataxia.   Sensory exam is intact to light touch, pinprick, vibration, temperature sense and  proprioception in the upper extremities and very mildly decreased in the lower extremities.   Gait, station and balance: He stands up from the seated position with no difficulty and needs. No veering to one side is noted. No leaning to one side. Posture is mildly stooped, but age-appropriate. Stance is narrow-based. He turns in 2 steps. Balance is not impaired.     Assessment and Plan:   Assessment and Plan:  In summary, Dustin Vance is a very pleasant 79 y.o.-year old male with a history of memory loss but stable findings. He is probably in the realm of mild cognitive impairment. He has remained fairly stable with one point loss in the MMSE and 1 point loss in the category fluency test. I suggested that he continue with his current medication which is Aricept generic 10 mg once daily.   I had a long chat with the patient and about my findings and the diagnosis of memory loss, its prognosis and treatment options. Implications of diagnosis explained at length with the patient and caregiver. We talked about medical treatments and non-pharmacological approaches. We talked about maintaining a healthy lifestyle in general and staying active mentally and physically, and the importance of better glucose control. I encouraged the patient to eat healthy, exercise daily and keep well hydrated, to keep a scheduled bedtime and wake time routine, to not skip any meals and eat healthy snacks in between meals and to have protein with every meal. I stressed the importance of regular exercise, within of course the patient's own mobility limitations. I encouraged the patient to keep up with current events by reading the news paper or watching the news.   As far as further diagnostic testing is concerned, I suggested the following: no change.  As far as medications are concerned, I recommended the following at this time: no change.  I answered all their questions today and the patient and his wife were in agreement  with the above outlined plan. I would like to see the patient back in 6 months, sooner if the need arises and encouraged them to call with any interim questions, concerns, problems, updates and refill requests.

## 2013-04-08 NOTE — Patient Instructions (Addendum)
I think overall you are doing fairly well but I do want to suggest a few things today:  Remember to drink plenty of fluid, eat healthy meals and do not skip any meals. Try to eat protein with a every meal and eat a healthy snack such as fruit or nuts in between meals. Try to keep a regular sleep-wake schedule and try to exercise daily, particularly in the form of walking, 20-30 minutes a day, if you can.   Engage in social activities in your community and with your family and try to keep up with current events by reading the newspaper or watching the news.   As far as your medications are concerned, I would like to suggest no changes.   As far as diagnostic testing: no new test.   I would like to see you back in 3 months, sooner if we need to. Please call us with any interim questions, concerns, problems, updates or refill requests.  Please also call us for any test results so we can go over those with you on the phone. Brett Canales is my clinical assistant and will answer any of your questions and relay your messages to me and also relay most of my messages to you.  Our phone number is [phone]. We also have an after hours call service for urgent matters and there is a physician on-call for urgent questions. For any emergencies you know to call 911 or go to the nearest emergency room.

## 2013-04-09 ENCOUNTER — Ambulatory Visit (INDEPENDENT_AMBULATORY_CARE_PROVIDER_SITE_OTHER): Payer: Medicare Other | Admitting: Family Medicine

## 2013-04-09 ENCOUNTER — Encounter: Payer: Self-pay | Admitting: Family Medicine

## 2013-04-09 VITALS — BP 130/80 | Temp 98.4°F | Ht 71.0 in | Wt 172.0 lb

## 2013-04-09 DIAGNOSIS — I10 Essential (primary) hypertension: Secondary | ICD-10-CM

## 2013-04-09 DIAGNOSIS — E109 Type 1 diabetes mellitus without complications: Secondary | ICD-10-CM

## 2013-04-09 DIAGNOSIS — C109 Malignant neoplasm of oropharynx, unspecified: Secondary | ICD-10-CM

## 2013-04-09 DIAGNOSIS — Z923 Personal history of irradiation: Secondary | ICD-10-CM

## 2013-04-09 DIAGNOSIS — I724 Aneurysm of artery of lower extremity: Secondary | ICD-10-CM

## 2013-04-09 DIAGNOSIS — E785 Hyperlipidemia, unspecified: Secondary | ICD-10-CM

## 2013-04-09 DIAGNOSIS — Z86010 Personal history of colonic polyps: Secondary | ICD-10-CM

## 2013-04-09 DIAGNOSIS — R413 Other amnesia: Secondary | ICD-10-CM

## 2013-04-09 DIAGNOSIS — J449 Chronic obstructive pulmonary disease, unspecified: Secondary | ICD-10-CM

## 2013-04-09 NOTE — Patient Instructions (Signed)
Continue your current treatment program  Followup in 3 months for your diabetes  Nonfasting labs one week prior

## 2013-04-09 NOTE — Progress Notes (Signed)
  Subjective:    Patient ID: Dustin Vance, male    DOB: 18-Dec-1932, 79 y.o.   MRN: [id number]  HPI  Dustin Vance is a 79 year old married male nonsmoker who comes in today for a Medicare wellness examination because of a history of diabetes type 1, hyperlipidemia, memory loss,,,,,,, recent neurologic evaluation on Aricept 10 mg daily,,,,, vascular disease status post AAA repair, history of oropharyngeal cancer currently disease-free 6 status post radiation treatment, COPD  Medications reviewed the been no changes the Aricept is now 10 mg daily  He gets routine eye care, dental care, colonoscopy and GI, vaccinations up-to-date,  Cognitive function normal but he does have some short-term memory loss. Home health safety reviewed no issues identified, he walks on a regular basis, no guns in the house, he does have a health care power of attorney and living well   Review of Systems  Constitutional: Negative.   HENT: Negative.   Eyes: Negative.   Respiratory: Negative.   Cardiovascular: Negative.   Gastrointestinal: Negative.   Genitourinary: Negative.   Musculoskeletal: Negative.   Skin: Negative.   Neurological: Negative.   Psychiatric/Behavioral: Negative.        Objective:   Physical Exam  Constitutional: He is oriented to person, place, and time. He appears well-developed and well-nourished.  HENT:  Head: Normocephalic and atraumatic.  Right Ear: External ear normal.  Left Ear: External ear normal.  Nose: Nose normal.  Mouth/Throat: Oropharynx is clear and moist.  Eyes: Conjunctivae and EOM are normal. Pupils are equal, round, and reactive to light.  Neck: Normal range of motion. Neck supple. No JVD present. No tracheal deviation present. No thyromegaly present.  Cardiovascular: Normal rate, regular rhythm, normal heart sounds and intact distal pulses.  Exam reveals no gallop and no friction rub.   No murmur heard. No carotid bruits,,,,,,, scar midline abdomen from previous AAA  repair no aortic bruit  Peripheral pulses 1+ and symmetrical  No neuropathy skin of the feet normal nails normal  Pulmonary/Chest: Effort normal and breath sounds normal. No stridor. No respiratory distress. He has no wheezes. He has no rales. He exhibits no tenderness.  Abdominal: Soft. Bowel sounds are normal. He exhibits no distension and no mass. There is no tenderness. There is no rebound and no guarding.  Genitourinary: Rectum normal, prostate normal and penis normal. Guaiac negative stool. No penile tenderness.  Penile implant 15 years ago by Dr. Patsi Vance  Musculoskeletal: Normal range of motion. He exhibits no edema and no tenderness.  Lymphadenopathy:    He has no cervical adenopathy.  Neurological: He is alert and oriented to person, place, and time. He has normal reflexes. No cranial nerve deficit. He exhibits normal muscle tone.  Skin: Skin is warm and dry. No rash noted. No erythema. No pallor.  Total body skin exam normal  Psychiatric: He has a normal mood and affect. His behavior is normal. Judgment and thought content normal.          Assessment & Plan:  Healthy male  Diabetes type 1 continue current therapy  History of oropharyngeal cancer continue followup in the oncology center  Status post AAA repair  COPD relatively asymptomatic no further workup at this time or treatment  Hyperlipidemia continue Crestor 5 mg daily  Low-grade anemia hemoglobin 12.5 will not do any further workup at this juncture.  Status post penile implant

## 2013-04-27 ENCOUNTER — Ambulatory Visit: Payer: Medicare Other | Admitting: Family Medicine

## 2013-06-17 ENCOUNTER — Other Ambulatory Visit: Payer: Medicare Other

## 2013-06-17 ENCOUNTER — Ambulatory Visit: Payer: Medicare Other | Admitting: Radiation Oncology

## 2013-06-17 ENCOUNTER — Ambulatory Visit
Admission: RE | Admit: 2013-06-17 | Discharge: 2013-06-17 | Disposition: A | Discharge status: Home / Self Care | Payer: Medicare Other | Source: Ambulatory Visit | Attending: Radiation Oncology | Admitting: Radiation Oncology

## 2013-06-17 VITALS — BP 153/78 | HR 75 | Temp 98.3°F | Ht 71.0 in | Wt 171.6 lb

## 2013-06-17 DIAGNOSIS — C024 Malignant neoplasm of lingual tonsil: Secondary | ICD-10-CM

## 2013-06-17 NOTE — Progress Notes (Signed)
Dustin Vance here with his wife for follow up after treatment to his oropharynx.  He denies pain but does have soreness in his back from mowing the lawn.  He states his mouth is not as dry as it was.  He is able to swallow but some foods get stuck in his throat.  He does have fatigue.  He denies hearing changes.

## 2013-06-18 ENCOUNTER — Ambulatory Visit: Payer: Medicare Other | Admitting: Radiation Oncology

## 2013-06-19 NOTE — Progress Notes (Signed)
  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  The patient denies any new changes in the head and neck region. He states that his xerostomia has improved to some degree. He indicates that food occasionally gets stuck in his throat. No major change in this. No other dysphagia or odynophagia.                              ALLERGIES:  has No Known Allergies.  Meds: Current Outpatient Prescriptions  Medication Sig Dispense Refill  . ACCU-CHEK AVIVA PLUS test strip       . aspirin 81 MG tablet Take 81 mg by mouth daily.        . Coenzyme Q10 (COQ10) 100 MG CAPS Take by mouth daily.        [name] donepezil (ARICEPT) 5 MG tablet Take 2 tablets (10 mg total) by mouth at bedtime as needed.  100 tablet  3  . insulin glargine (LANTUS) 100 UNIT/ML injection Inject 20-25 Units into the skin at bedtime.       . insulin lispro (HUMALOG) 100 UNIT/ML injection Inject 5 Units into the skin 3 (three) times daily before meals.       . Multiple Vitamin (MULTIVITAMIN PO) Take by mouth daily.        . rosuvastatin (CRESTOR) 5 MG tablet Take 1 tablet (5 mg total) by mouth daily.  90 tablet  3  . polyethylene glycol powder (GLYCOLAX/MIRALAX) powder        No current facility-administered medications for this encounter.    Physical Findings: The patient is in no acute distress. Patient is alert and oriented.  height is 5\' 11"  (1.803 m) and weight is 171 lb 9.6 oz (77.837 kg). His temperature is 98.3 F (36.8 C). His blood pressure is 153/78 and his pulse is 75. His oxygen saturation is 95%. .   General: Well-developed, in no acute distress HEENT: Normocephalic,  atraumatic;  oral cavity and oropharynx clear on mirror exam Cardiovascular: Regular rate and rhythm Respiratory: Clear to auscultation bilaterally GI: Soft, nontender, normal bowel sounds Extremities: No edema present   Lab Findings: Lab Results  Component Value Date   WBC 6.3 03/30/2013   HGB 12.3* 03/30/2013   HCT 38.0* 03/30/2013   MCV 93.1 03/30/2013   PLT 202 03/30/2013     Radiographic Findings: No results found.  Impression:    The patient is doing well at this time. No significant new symptoms and his radiation related symptoms such as xerostomia have continued to improve a little bit. Clinically NED. We will continue routine followup and staggered this with ENT he maintains ongoing followup.  Plan:  We will have him return to clinic in 4 months for ongoing followup to stagger visits, and then continue seeing him on a every 6 month basis.  I spent 15 minutes with the patient today, the majority of which was spent counseling the patient on the diagnosis of cancer and coordinating care.   Radene Gunning, M.D., Ph.D.

## 2013-06-24 ENCOUNTER — Ambulatory Visit: Payer: Medicare Other | Admitting: Family Medicine

## 2013-07-10 ENCOUNTER — Other Ambulatory Visit (INDEPENDENT_AMBULATORY_CARE_PROVIDER_SITE_OTHER): Payer: Medicare Other

## 2013-07-10 ENCOUNTER — Ambulatory Visit (INDEPENDENT_AMBULATORY_CARE_PROVIDER_SITE_OTHER): Payer: Medicare Other

## 2013-07-10 DIAGNOSIS — E785 Hyperlipidemia, unspecified: Secondary | ICD-10-CM

## 2013-07-10 DIAGNOSIS — E1310 Other specified diabetes mellitus with ketoacidosis without coma: Secondary | ICD-10-CM

## 2013-07-10 DIAGNOSIS — Z23 Encounter for immunization: Secondary | ICD-10-CM

## 2013-07-10 DIAGNOSIS — E1110 Type 2 diabetes mellitus with ketoacidosis without coma: Secondary | ICD-10-CM

## 2013-07-10 DIAGNOSIS — R351 Nocturia: Secondary | ICD-10-CM

## 2013-07-10 DIAGNOSIS — I10 Essential (primary) hypertension: Secondary | ICD-10-CM

## 2013-07-10 LAB — HEPATIC FUNCTION PANEL
ALT: 17 U/L (ref 0–53)
Alkaline Phosphatase: 78 U/L (ref 39–117)
Bilirubin, Direct: 0.1 mg/dL (ref 0.0–0.3)
Total Protein: 7.3 g/dL (ref 6.0–8.3)

## 2013-07-10 LAB — BASIC METABOLIC PANEL
BUN: 18 mg/dL (ref 6–23)
CO2: 29 mEq/L (ref 19–32)
Calcium: 9.1 mg/dL (ref 8.4–10.5)
Chloride: 105 mEq/L (ref 96–112)
Creatinine, Ser: 1.2 mg/dL (ref 0.4–1.5)
GFR: 59.95 mL/min — ABNORMAL LOW (ref >60.00)
Glucose, Bld: 155 mg/dL — ABNORMAL HIGH (ref 70–99)
Potassium: 4.3 mEq/L (ref 3.5–5.1)
Sodium: 144 mEq/L (ref 135–145)

## 2013-07-10 LAB — POCT URINALYSIS DIPSTICK
Nitrite, UA: NEGATIVE
Spec Grav, UA: >=1.03

## 2013-07-10 LAB — MICROALBUMIN / CREATININE URINE RATIO
Creatinine,U: 265.9 mg/dL
Microalb, Ur: 19.5 mg/dL — ABNORMAL HIGH (ref 0.0–1.9)

## 2013-07-10 LAB — LIPID PANEL
Cholesterol: 197 mg/dL (ref 0–200)
Total CHOL/HDL Ratio: 11
Triglycerides: 120 mg/dL (ref 0.0–149.0)

## 2013-07-10 LAB — CBC WITH DIFFERENTIAL/PLATELET
Eosinophils Absolute: 0.3 10*3/uL (ref 0.0–0.7)
MCHC: 33.6 g/dL (ref 30.0–36.0)
MCV: 90.9 fl (ref 78.0–100.0)
Monocytes Absolute: 0.5 10*3/uL (ref 0.1–1.0)
Neutrophils Relative %: 62.1 % (ref 43.0–77.0)
Platelets: 235 10*3/uL (ref 150.0–400.0)

## 2013-07-10 LAB — HEMOGLOBIN A1C: Hgb A1c MFr Bld: 10.7 % — ABNORMAL HIGH (ref 4.6–6.5)

## 2013-07-10 LAB — PSA: PSA: 0.39 ng/mL (ref 0.10–4.00)

## 2013-07-20 ENCOUNTER — Encounter: Payer: Self-pay | Admitting: Family Medicine

## 2013-07-20 ENCOUNTER — Ambulatory Visit (INDEPENDENT_AMBULATORY_CARE_PROVIDER_SITE_OTHER): Payer: Medicare Other | Admitting: Family Medicine

## 2013-07-20 VITALS — BP 120/80 | Temp 97.9°F | Wt 169.0 lb

## 2013-07-20 DIAGNOSIS — E109 Type 1 diabetes mellitus without complications: Secondary | ICD-10-CM

## 2013-07-20 NOTE — Patient Instructions (Signed)
Increase the insulin,,,,,,,,,, 8 units before each meal,,,,,,,,,,,, 30 units of Lantus at bedtime  Fasting blood sugar daily in the morning  Return in 2 weeks with a record of volume blood sugar readings

## 2013-07-20 NOTE — Progress Notes (Signed)
  Subjective:    Patient ID: Dustin Vance, male    DOB: 04/20/1933, 79 y.o.   MRN: [id number]  HPI Dustin Vance is a 79 year old male married nonsmoker who comes in today for evaluation of diabetes type 1  Recent blood sugar was 155 however he had to push her vision in his hemoglobin A1c was 10.7. He's been on insulin regular 5 units before each meal which sometimes he doesn't take his he forgets and 20 units of Lantus at bedtime.  His weight is good 169  You will not follow a diet   Review of Systems Review of systems otherwise negative except for increased urination from the high blood sugar    Objective:   Physical Exam  Well-developed well-nourished male in no acute distress      Assessment & Plan:  Diabetes type 1 with very poor control plan increase the insulin to 8 units before each meal increase Lantus to 30 units at bedtime.  Fasting blood sugar daily  Followup in 2 weeks  If we don't see improvement I would recommend an Endo consult

## 2013-07-31 ENCOUNTER — Telehealth: Payer: Self-pay | Admitting: Family Medicine

## 2013-07-31 NOTE — Telephone Encounter (Signed)
Calling to report pt was seen in home today and on exam revealed rales, crackling in lungs and swelling of legs.(signs associated with CHF).  Pt  is scheduled to see PCP next week and plans to discuss at time of visit.

## 2013-08-03 ENCOUNTER — Encounter: Payer: Self-pay | Admitting: Family Medicine

## 2013-08-03 ENCOUNTER — Ambulatory Visit (INDEPENDENT_AMBULATORY_CARE_PROVIDER_SITE_OTHER): Payer: Medicare Other | Admitting: Family Medicine

## 2013-08-03 VITALS — BP 140/80 | HR 98 | Temp 98.1°F | Wt 171.0 lb

## 2013-08-03 DIAGNOSIS — E109 Type 1 diabetes mellitus without complications: Secondary | ICD-10-CM

## 2013-08-03 NOTE — Progress Notes (Signed)
  Subjective:    Patient ID: Dustin Vance, male    DOB: 10-29-1932, 79 y.o.   MRN: [id number]  HPI Dustin Vance is a 79 year old male who comes in today for followup of diabetes type 1  We saw him 2 weeks ago his blood sugar is out of control. We changed his insulin and are giving him 8 units of regular before each meal and 25 units long-acting at bedtime now his morning blood sugars are averaging in the 120 range. He did have a 67 and she fed him right away.   Review of Systems Review of systems negative    Objective:   Physical Exam  Well-developed well-nourished male no acute distress vital signs stable he is afebrile BP 140/80      Assessment & Plan:  Diabetes type 1 at goal continue current therapy followup in 3 months

## 2013-08-03 NOTE — Patient Instructions (Signed)
Fasting blood sugar daily in the morning  Continue current regime of insulin  Followup in 3 months  A1c one week prior

## 2013-08-03 NOTE — Telephone Encounter (Signed)
Noted by Dr Todd 

## 2013-08-31 ENCOUNTER — Telehealth: Payer: Self-pay | Admitting: Family Medicine

## 2013-08-31 NOTE — Telephone Encounter (Signed)
Pt has had a couple of episodes of unexplained pain between shoulder blade. (back) also his sugar has been running high and wife states she cannot get it down. would like appt this week,. Only same day. pls call after 2:30pm

## 2013-08-31 NOTE — Telephone Encounter (Signed)
Spoke with wife and an appointment made. 

## 2013-09-02 ENCOUNTER — Encounter: Payer: Self-pay | Admitting: Family Medicine

## 2013-09-02 ENCOUNTER — Ambulatory Visit (INDEPENDENT_AMBULATORY_CARE_PROVIDER_SITE_OTHER)
Admission: RE | Admit: 2013-09-02 | Discharge: 2013-09-02 | Disposition: A | Discharge status: Home / Self Care | Payer: Medicare Other | Source: Ambulatory Visit | Attending: Family Medicine | Admitting: Family Medicine

## 2013-09-02 ENCOUNTER — Ambulatory Visit (INDEPENDENT_AMBULATORY_CARE_PROVIDER_SITE_OTHER): Payer: Medicare Other | Admitting: Family Medicine

## 2013-09-02 VITALS — BP 140/70 | Temp 97.9°F | Wt 169.0 lb

## 2013-09-02 DIAGNOSIS — E109 Type 1 diabetes mellitus without complications: Secondary | ICD-10-CM

## 2013-09-02 DIAGNOSIS — M545 Low back pain: Secondary | ICD-10-CM

## 2013-09-02 DIAGNOSIS — F17200 Nicotine dependence, unspecified, uncomplicated: Secondary | ICD-10-CM

## 2013-09-02 DIAGNOSIS — I10 Essential (primary) hypertension: Secondary | ICD-10-CM

## 2013-09-02 NOTE — Patient Instructions (Signed)
Continue the current dose of insulin 25 units at bedtime and 12 units before each meal  Go to the main office now for a chest x-ray

## 2013-09-02 NOTE — Progress Notes (Signed)
Pre visit review using our clinic review tool, if applicable. No additional management support is needed unless otherwise documented below in the visit note. 

## 2013-09-02 NOTE — Progress Notes (Signed)
  Subjective:    Patient ID: Dustin Vance, male    DOB: July 23, 1933, 79 y.o.   MRN: [id number]  HPI Mr. Stene is a 79 year old male who comes in today accompanied by his wife as he has underlying Alzheimer's disease for evaluation of 2 problems  She states over the past couple weeks she's had 4 episodes we had the sudden onset of severe back pain and she points to the thoracic area of his spine as a source of his pain. 3 episodes he was just in the shower. No history of trauma. The pain lasts about 30 minutes somewhat wide. It felt somewhat sharp or dull he can't quite tell it was a 5-6 on a skilled 1-10. The pain did not radiate. He had no other constitutional symptoms. 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 He is a smoker has underlying diabetes and hypertension  He takes 25 units of Lantus at bedtime and a sliding scale before each meal. They've increased that to 12 units before each meal no blood sugars in the 105 range   Review of Systems Review of systems otherwise negative    Objective:   Physical Exam  Well-developed well-nourished male in no acute distress vital signs stable he is afebrile cardiopulmonary exam normal palpation of spine shows no bony tenderness he does have some slight prominence T7-8 and 9      Assessment & Plan:  Diabetes type 1 continue current therapy  Episodic back pain chest x-ray question thoracic aneurysm

## 2013-09-28 ENCOUNTER — Encounter: Payer: Self-pay | Admitting: Neurology

## 2013-09-28 ENCOUNTER — Ambulatory Visit (INDEPENDENT_AMBULATORY_CARE_PROVIDER_SITE_OTHER): Payer: Medicare Other | Admitting: Neurology

## 2013-09-28 VITALS — BP 159/72 | HR 70 | Temp 97.7°F | Ht 71.0 in | Wt 165.0 lb

## 2013-09-28 DIAGNOSIS — E1149 Type 2 diabetes mellitus with other diabetic neurological complication: Secondary | ICD-10-CM

## 2013-09-28 DIAGNOSIS — G609 Hereditary and idiopathic neuropathy, unspecified: Secondary | ICD-10-CM

## 2013-09-28 DIAGNOSIS — R413 Other amnesia: Secondary | ICD-10-CM

## 2013-09-28 MED ORDER — DONEPEZIL HCL 10 MG PO TABS: 10.0000 mg | ORAL_TABLET | Freq: Every day | ORAL | Status: DC

## 2013-09-28 NOTE — Progress Notes (Signed)
Subjective:    Patient ID: Dustin Vance is a 79 y.o. male.  HPI    Interim history:   Dustin Vance is a very pleasant 79 year old right-handed gentleman who presents for followup consultation of his memory loss. The patient is accompanied by his wife again today. I first met him on 04/08/2013 at which time I encouraged him to continue with the Aricept 10 mg strength. I did not order any new test and I felt that he was stable.  Today, he reports no new changes in his medications with the exception of Crestor is now 3/week, d/t leg cramping reported. His memory is stable. His neuropathy seems to be stable. He has had trouble with blood sugar control. He has had his flu shot. He has had no recent illness. He is tolerating aricept 10 mg daily. His wife reports that he does not exercise regularly.  He previously followed with Dr. Avie Echevaria and was last seen by him on 09/18/2012, at which time Dr. [name] felt the patient was stable and doing well. His MMSE was improved and he was encouraged to exercise.  He has an underlying medical history of diabetes, oral pharyngeal carcinoma, status post radiation therapy as well as chemotherapy in August and October 2011, hyperlipidemia, hypertension and had an isolated syncopal episode.  He has an approximately 4 year history of forgetfulness and memory loss. There is no family history of dementia. There is no personal history of head trauma, or substance abuse. He has been diabetic for many years. In April 2012 his MMSE was 28, his brain MRI showed Sylvian, mesial temporal and diffuse atrophy with enlarged ventricles and mild to moderate chronic small vessel disease.  B12 level was 302 and RPR was negative. In August 2012 his MMSE was 27, clock drawing was 4, animal fluency was 12. In June 2013 his MMSE was 27, clock drawing was 3, animal fluency was 15. In December 2013 his MMSE was 30, clock drawing was 4, animal fluency was 11.   His Past Medical History Is  Significant For: Past Medical History  Diagnosis Date  . Hyperlipidemia   . AAA (abdominal aortic aneurysm)     status post repair in 2005  . Hx of colonic polyps   . CKD (chronic kidney disease)     last creatinine   . Oropharynx cancer   . Cancer 04/28/10    R base of tongue, inv squamous cell, HPV +  . Diabetes mellitus type I   . Hypertension   . COPD (chronic obstructive pulmonary disease)   . Hx of radiation therapy 06/01/10 to 07/21/10    R base of tongue  . Anemia     mild/chemo-induced   His Past Surgical History Is Significant For: Past Surgical History  Procedure Laterality Date  . Tonsillectomy    . Colonoscopy w/ polypectomy    . Hemorrhoid surgery    . Inguinal hernia repair    . Penial implant    . Rotator cuff repair    . Appendectomy    . Abdominal aortic aneurysm repair     His Family History Is Significant For: Family History  Problem Relation Age of Onset  . Cancer Other   . Stroke Other   . Cancer Mother     unknown type  . Cancer Sister     lung  . Cancer Sister     brain   His Social History Is Significant For: History   Social History  . Marital  Status: Married    Spouse Name: N/A    Number of Children: 3  . Years of Education: N/A   Occupational History  . Retired    Social History Main Topics  . Smoking status: Former Smoker -- 1.00 packs/day for 50 years    Types: Cigarettes    Quit date: 01/29/2009  . Smokeless tobacco: None  . Alcohol Use: No  . Drug Use: No  . Sexual Activity: None   Other Topics Concern  . None   Social History Narrative  . None   His Allergies Are:  No Known Allergies:   His Current Medications Are:  Outpatient Encounter Prescriptions as of 09/28/2013  Medication Sig  . ACCU-CHEK AVIVA PLUS test strip   . aspirin 81 MG tablet Take 81 mg by mouth daily.    . Coenzyme Q10 (COQ10) 100 MG CAPS Take by mouth daily.    [name] donepezil (ARICEPT) 5 MG tablet Take 2 tablets (10 mg total) by mouth at  bedtime as needed.  . insulin glargine (LANTUS) 100 UNIT/ML injection Inject 20-25 Units into the skin at bedtime.   . insulin lispro (HUMALOG) 100 UNIT/ML injection Inject 8 Units into the skin 3 (three) times daily before meals.   . Multiple Vitamin (MULTIVITAMIN PO) Take by mouth daily.    . polyethylene glycol powder (GLYCOLAX/MIRALAX) powder   . rosuvastatin (CRESTOR) 5 MG tablet Take 5 mg by mouth 3 (three) times a week.  . [DISCONTINUED] rosuvastatin (CRESTOR) 5 MG tablet Take 1 tablet (5 mg total) by mouth daily.   Review of Systems:  Out of a complete 14 point review of systems, all are reviewed and negative with the exception of these symptoms as listed below:   Review of Systems  Constitutional: Negative.   HENT: Negative.   Eyes: Negative.   Respiratory: Negative.   Cardiovascular: Negative.   Gastrointestinal: Negative.   Endocrine: Negative.   Genitourinary: Positive for frequency.  Musculoskeletal: Negative.   Skin: Negative.   Allergic/Immunologic: Negative.   Neurological: Negative.   Hematological: Negative.   Psychiatric/Behavioral: Negative.     Objective:  Neurologic Exam  Physical Exam Physical Examination:   Filed Vitals:   09/28/13 0949  BP: 159/72  Pulse: 70  Temp: 97.7 F (36.5 C)   General Examination: The patient is a very pleasant 79 y.o. male in no acute distress. He is calm and cooperative with the exam. He denies Auditory Hallucinations and Visual Hallucinations.   HEENT: Normocephalic, atraumatic, pupils are equal, round and reactive to light and accommodation. Funduscopic exam is normal with sharp disc margins noted. Extraocular tracking shows mild saccadic breakdown without nystagmus noted. Hearing is intact with bilateral hearing aids in place. Tympanic membranes are clear bilaterally. Face is symmetric with no facial masking and normal facial sensation. There is no lip, neck or jaw tremor. Neck is not rigid with intact passive ROM. There  are no carotid bruits on auscultation. Oropharynx exam reveals mild mouth dryness. No significant airway crowding is noted. Mallampati is class II. Tongue protrudes centrally and palate elevates symmetrically. He is status post chemo and radiation.    Chest: is clear to auscultation without wheezing, rhonchi or crackles noted.  Heart: sounds are regular and normal without murmurs, rubs or gallops noted.   Abdomen: is soft, non-tender and non-distended with normal bowel sounds appreciated on auscultation.  Extremities: There is trace pitting edema in the distal lower extremities bilaterally, around the ankles. Pedal pulses are intact.  Skin: is warm  and dry with no trophic changes noted.  Musculoskeletal: exam reveals no obvious joint deformities, tenderness or joint swelling or erythema.  Neurologically:  Mental status: The patient is awake and alert, paying good  attention. He is able to provide the history. His wife provides details. He is oriented to: person, place, time/date, situation, day of week, month of year and year. His memory, attention, language and knowledge are impaired very mildly. There is no aphasia, agnosia, apraxia or anomia. There is a no degree of bradyphrenia. Speech is mildly hypophonic with mild dysarthria noted, d/t cancer treatment. Mood is congruent and affect is normal.  His MMSE score on 04/08/13 was: 29/30, CDT was 4/4, AFT (Animal Fluency Test) score was 10.   Cranial nerves are as described above under HEENT exam. In addition, shoulder shrug is normal with equal shoulder height noted.  Motor exam: Normal bulk, and strength for age is noted. Tone is Not rigid with absence of cogwheeling. There is overall no bradykinesia. There is no drift or rebound. There is no tremor.   Romberg is negative. Reflexes are 2+ in the upper extremities and 2+ in the lower extremities. Toes are downgoing bilaterally. Fine motor skills: Finger taps, hand movements, and rapid alternating  patting are not impaired bilaterally. Foot taps and foot agility are not impaired bilaterally.   Cerebellar testing shows no dysmetria or intention tremor on finger to nose testing. Heel to shin is unremarkable. There is no truncal or gait ataxia.   Sensory exam is intact to light touch, pinprick, vibration, temperature sense in the upper extremities and very mildly decreased in the lower extremities for PP, temperature and vibration sense up to the ankles bilaterally.   Gait, station and balance: He stands up from the seated position with no difficulty and needs. No veering to one side is noted. No leaning to one side. Posture is mildly stooped, but age-appropriate. Stance is narrow-based. He turns in 2 steps. Balance is not impaired.   Assessment and Plan:   In summary, KEVAL NAM is a very pleasant 80 y.o.-year old male with a history of memory loss with stable findings. He is probably in the realm of mild cognitive impairment. He has remained fairly stable in the past year or so and tolerates Aricept generic 10 mg once daily. I suggested that he continue with his current medication.   As far as his neuropathy is concerned, this is most likely diabetic neuropathy and I have explained to him that it is primarily driven by how good his blood sugar control is. He denies any pain or burning sensation. We talked about maintaining a healthy lifestyle in general and staying active mentally and physically, and the importance of better glucose control. I encouraged the patient to eat healthy, exercise daily and keep well hydrated, to keep a scheduled bedtime and wake time routine, to not skip any meals and eat healthy snacks in between meals and to have protein with every meal. I stressed the importance of regular exercise, within of course the patient's own mobility limitations. I encouraged the patient to keep up with current events by reading the news paper or watching the news. I suggested that he  walk regularly.  As far as further diagnostic testing is concerned, I suggested the following: no change.  As far as medications are concerned, I recommended the following at this time: no change. I renewed a 90 day prescription for Aricept generic. They requested a printed prescription because they will have an  insurance change the first of the year. I answered all their questions today and the patient and his wife were in agreement with the above outlined plan. I would like to see the patient back in 6 months, sooner if the need arises and encouraged them to call with any interim questions, concerns, problems, updates and refill requests.

## 2013-09-28 NOTE — Patient Instructions (Signed)
I think overall you are doing fairly well but I do want to suggest a few things today:  Remember to drink plenty of fluid, eat healthy meals and do not skip any meals. Try to eat protein with a every meal and eat a healthy snack such as fruit or nuts in between meals. Try to keep a regular sleep-wake schedule and try to exercise daily, particularly in the form of walking, 20-30 minutes a day, if you can.   Engage in social activities in your community and with your family and try to keep up with current events by reading the newspaper or watching the news.   As far as your medications are concerned, I would like to suggest no changes in the Aricept.    As far as diagnostic testing: no new test needed from my end of things.   I would like to see you back in 6 months, sooner if we need to. Please call us with any interim questions, concerns, problems, updates or refill requests.  Brett Canales is my clinical assistant and will answer any of your questions and relay your messages to me and also relay most of my messages to you.  Our phone number is [phone]. We also have an after hours call service for urgent matters and there is a physician on-call for urgent questions. For any emergencies you know to call 911 or go to the nearest emergency room.

## 2013-10-09 ENCOUNTER — Ambulatory Visit: Payer: Medicare Other | Admitting: Neurology

## 2013-10-14 ENCOUNTER — Telehealth: Payer: Self-pay | Admitting: Family Medicine

## 2013-10-14 NOTE — Telephone Encounter (Signed)
Patients wife called stating the patient is out of Humalog and would like A sample Please advise

## 2013-10-14 NOTE — Telephone Encounter (Signed)
Samples available for pick up and wife is aware.  

## 2013-10-20 ENCOUNTER — Other Ambulatory Visit (INDEPENDENT_AMBULATORY_CARE_PROVIDER_SITE_OTHER): Payer: Medicare HMO

## 2013-10-20 DIAGNOSIS — E109 Type 1 diabetes mellitus without complications: Secondary | ICD-10-CM

## 2013-10-20 LAB — BASIC METABOLIC PANEL
BUN: 22 mg/dL (ref 6–23)
CALCIUM: 8.5 mg/dL (ref 8.4–10.5)
CO2: 26 mEq/L (ref 19–32)
CREATININE: 1.4 mg/dL (ref 0.4–1.5)
Chloride: 105 mEq/L (ref 96–112)
GFR: 52.51 mL/min — AB (ref >60.00)
GLUCOSE: 258 mg/dL — AB (ref 70–99)
Potassium: 4.5 mEq/L (ref 3.5–5.1)
Sodium: 138 mEq/L (ref 135–145)

## 2013-10-20 LAB — HEMOGLOBIN A1C: Hgb A1c MFr Bld: 11.1 % — ABNORMAL HIGH (ref 4.6–6.5)

## 2013-10-22 ENCOUNTER — Ambulatory Visit
Admission: RE | Admit: 2013-10-22 | Discharge: 2013-10-22 | Disposition: A | Discharge status: Home / Self Care | Payer: Medicare HMO | Source: Ambulatory Visit | Attending: Radiation Oncology | Admitting: Radiation Oncology

## 2013-10-22 ENCOUNTER — Encounter: Payer: Self-pay | Admitting: Radiation Oncology

## 2013-10-22 VITALS — BP 140/70 | HR 71 | Temp 97.3°F | Resp 16 | Wt 163.0 lb

## 2013-10-22 DIAGNOSIS — Y842 Radiological procedure and radiotherapy as the cause of abnormal reaction of the patient, or of later complication, without mention of misadventure at the time of the procedure: Secondary | ICD-10-CM | POA: Insufficient documentation

## 2013-10-22 DIAGNOSIS — L905 Scar conditions and fibrosis of skin: Secondary | ICD-10-CM | POA: Insufficient documentation

## 2013-10-22 DIAGNOSIS — Z923 Personal history of irradiation: Secondary | ICD-10-CM | POA: Insufficient documentation

## 2013-10-22 DIAGNOSIS — Z7982 Long term (current) use of aspirin: Secondary | ICD-10-CM | POA: Insufficient documentation

## 2013-10-22 DIAGNOSIS — Z794 Long term (current) use of insulin: Secondary | ICD-10-CM | POA: Insufficient documentation

## 2013-10-22 DIAGNOSIS — C109 Malignant neoplasm of oropharynx, unspecified: Secondary | ICD-10-CM | POA: Insufficient documentation

## 2013-10-22 DIAGNOSIS — C024 Malignant neoplasm of lingual tonsil: Secondary | ICD-10-CM

## 2013-10-22 DIAGNOSIS — Z79899 Other long term (current) drug therapy: Secondary | ICD-10-CM | POA: Insufficient documentation

## 2013-10-22 MED ORDER — LARYNGOSCOPY SOLUTION RAD-ONC
15.0000 mL | Freq: Once | TOPICAL | Status: AC
  Administered 2013-10-22: 15 mL via TOPICAL
  Filled 2013-10-22: qty 15

## 2013-10-22 NOTE — Progress Notes (Signed)
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  The patient denies any new changes in the head and neck region. The patient states that he has done relatively well. He does note some possible change in terms of swallowing pills where he feels that his pills get stuck more than they used to. He states that he usually takes one pill at a time but sometimes more. The patient denies any dysphasia or odynophagia. No pain in the head neck region.                              ALLERGIES:  has No Known Allergies.  Meds: Current Outpatient Prescriptions  Medication Sig Dispense Refill  . ACCU-CHEK AVIVA PLUS test strip       . aspirin 81 MG tablet Take 81 mg by mouth daily.        . Coenzyme Q10 (COQ10) 100 MG CAPS Take by mouth daily.        [name] donepezil (ARICEPT) 10 MG tablet Take 1 tablet (10 mg total) by mouth at bedtime.  90 tablet  3  . insulin glargine (LANTUS) 100 UNIT/ML injection Inject 20-25 Units into the skin at bedtime.       . insulin lispro (HUMALOG) 100 UNIT/ML injection Inject 8 Units into the skin 3 (three) times daily before meals.       . Multiple Vitamin (MULTIVITAMIN PO) Take by mouth daily.        . rosuvastatin (CRESTOR) 5 MG tablet Take 5 mg by mouth 3 (three) times a week.      . polyethylene glycol powder (GLYCOLAX/MIRALAX) powder Take 17 g by mouth as needed.        No current facility-administered medications for this encounter.    Physical Findings: The patient is in no acute distress. Patient is alert and oriented.  weight is 163 lb (73.936 kg). His oral temperature is 97.3 F (36.3 C). His blood pressure is 140/70 and his pulse  is 71. His respiration is 16 and oxygen saturation is 100%. .   General: Well-developed, in no acute distress HEENT: Normocephalic, atraumatic;  oral cavity clear; some radiation fibrosis in the neck, moderate Cardiovascular: Regular rate and rhythm Respiratory: Clear to auscultation bilaterally GI: Soft, nontender, normal bowel sounds Extremities: No edema present Fiberoptic exam: After the use of topical anesthetic, the flexible laryngoscope was passed through the right near. Good visualization was obtained. No lesions or suspicious findings within the larynx, hypopharynx, oropharynx or nasopharynx.    Lab Findings: Lab Results  Component Value Date   WBC 6.9 07/10/2013   HGB 13.3 07/10/2013   HCT 39.6 07/10/2013   MCV 90.9 07/10/2013   PLT 235.0 07/10/2013     Radiographic Findings: No results found.  Impression:    The patient is doing well at this time. Clinically NED.   Plan:  We will have him return to clinic in 6 months for ongoing followup to stagger visits, and then continue seeing him on a every 6 month basis.   I spent 15 minutes with the patient today, the  majority of which was spent counseling the patient on the diagnosis of cancer and coordinating care.   Jodelle Gross, M.D., Ph.D.

## 2013-10-22 NOTE — Progress Notes (Signed)
Patient will be laryngscoped today per verbal order Dr. Lisbeth Renshaw, gave spry to both nostrils, tissue ofered, set up scope for MD 11:10 AM

## 2013-10-22 NOTE — Progress Notes (Signed)
Follow up oropharynx rad txs 2.5 years ago,  100% room air, c/o dry mouth still, when swallowing opill it gets stuck on left side throat k for a bit then with water eventually goes down, last TSH =2.17, eats most foods, still has difficulty with hard foods,  With chewing, soft foods hangs in left side of throat, energy level good  10:44 AM

## 2013-10-27 ENCOUNTER — Encounter: Payer: Self-pay | Admitting: Family Medicine

## 2013-10-27 ENCOUNTER — Ambulatory Visit (INDEPENDENT_AMBULATORY_CARE_PROVIDER_SITE_OTHER): Payer: Medicare HMO | Admitting: Family Medicine

## 2013-10-27 ENCOUNTER — Other Ambulatory Visit: Payer: Self-pay | Admitting: *Deleted

## 2013-10-27 VITALS — BP 130/80 | Temp 98.2°F | Ht 70.0 in | Wt 164.0 lb

## 2013-10-27 DIAGNOSIS — C109 Malignant neoplasm of oropharynx, unspecified: Secondary | ICD-10-CM

## 2013-10-27 DIAGNOSIS — E109 Type 1 diabetes mellitus without complications: Secondary | ICD-10-CM

## 2013-10-27 MED ORDER — GLUCOSE BLOOD VI STRP: ORAL_STRIP | Status: DC

## 2013-10-27 MED ORDER — INSULIN LISPRO 100 UNIT/ML ~~LOC~~ SOLN: 10.0000 [IU] | Freq: Three times a day (TID) | SUBCUTANEOUS | Status: DC

## 2013-10-27 MED ORDER — INSULIN GLARGINE 100 UNIT/ML ~~LOC~~ SOLN: 20.0000 [IU] | Freq: Every day | SUBCUTANEOUS | Status: DC

## 2013-10-27 MED ORDER — ROSUVASTATIN CALCIUM 5 MG PO TABS: 5.0000 mg | ORAL_TABLET | ORAL | Status: DC

## 2013-10-27 NOTE — Progress Notes (Signed)
   Subjective:    Patient ID: Dustin Vance, male    DOB: 02-10-1933, 80 y.o.   MRN: [id number]  HPI Dustin Vance is a 80 year old male who comes in today for followup of his diabetes  His hemoglobin A1c is now up to 11.1%. Blood sugar 258. His wife says he's not can compliant with his diet and is not walking on a regular basis.   Review of Systems Review of systems otherwise negative    Objective:   Physical Exam Well-developed thin male,,,, weight 164 pounds,,,,,, vital signs stable he is afebrile BP 130/80       Assessment & Plan:

## 2013-10-27 NOTE — Patient Instructions (Signed)
Increase the nighttime insulin to 35 units  Increase the insulin before each meal to 15 units  Fasting blood sugar daily in the morning  Return in 2 weeks for followup

## 2013-10-27 NOTE — Telephone Encounter (Signed)
Rx sent to pharmacy   

## 2013-10-28 ENCOUNTER — Telehealth: Payer: Self-pay

## 2013-10-28 NOTE — Telephone Encounter (Signed)
Relevant patient education mailed to patient.  

## 2013-11-03 ENCOUNTER — Telehealth: Payer: Self-pay | Admitting: *Deleted

## 2013-11-03 MED ORDER — INSULIN ASPART 100 UNIT/ML ~~LOC~~ SOLN: 15.0000 [IU] | Freq: Three times a day (TID) | SUBCUTANEOUS | Status: DC

## 2013-11-03 MED ORDER — INSULIN DETEMIR 100 UNIT/ML ~~LOC~~ SOLN: 30.0000 [IU] | Freq: Every day | SUBCUTANEOUS | Status: DC

## 2013-11-03 MED ORDER — ATORVASTATIN CALCIUM 10 MG PO TABS: 10.0000 mg | ORAL_TABLET | Freq: Every day | ORAL | Status: DC

## 2013-11-03 NOTE — Telephone Encounter (Signed)
Lantus, humalog, crestor all denied by insurance. Changed to levemir, novolog, and generic lipitor. rx changed and sent. Wife will call for refills or if a change needs to be made.

## 2013-11-06 ENCOUNTER — Telehealth: Payer: Self-pay | Admitting: Neurology

## 2013-11-06 DIAGNOSIS — G609 Hereditary and idiopathic neuropathy, unspecified: Secondary | ICD-10-CM

## 2013-11-06 DIAGNOSIS — R413 Other amnesia: Secondary | ICD-10-CM

## 2013-11-06 DIAGNOSIS — E1149 Type 2 diabetes mellitus with other diabetic neurological complication: Secondary | ICD-10-CM

## 2013-11-06 MED ORDER — DONEPEZIL HCL 10 MG PO TABS: 10.0000 mg | ORAL_TABLET | Freq: Every day | ORAL | Status: DC

## 2013-11-06 NOTE — Telephone Encounter (Signed)
Unfortunately, since Aricept is available in generic, they do not sample this medication.  I have tried call this patient back multiple times.  Each time I get no answer and no voicemail.  There is no alternate phone number on file.  I will send a small supply to the local pharmacy listed in the chart ([facility]) so patient can get meds there while waiting on mail order.  I have put a note on the Rx asking that the pharmacy contact the patient when meds are ready for pick up.

## 2013-11-10 ENCOUNTER — Ambulatory Visit (INDEPENDENT_AMBULATORY_CARE_PROVIDER_SITE_OTHER): Payer: Medicare HMO | Admitting: Family Medicine

## 2013-11-10 ENCOUNTER — Encounter: Payer: Self-pay | Admitting: Family Medicine

## 2013-11-10 VITALS — BP 140/70 | Temp 97.8°F | Wt 164.0 lb

## 2013-11-10 DIAGNOSIS — J449 Chronic obstructive pulmonary disease, unspecified: Secondary | ICD-10-CM

## 2013-11-10 DIAGNOSIS — E109 Type 1 diabetes mellitus without complications: Secondary | ICD-10-CM

## 2013-11-10 MED ORDER — HYDROCODONE-HOMATROPINE 5-1.5 MG/5ML PO SYRP: ORAL_SOLUTION | ORAL | Status: DC

## 2013-11-10 NOTE — Patient Instructions (Signed)
Drink lots of water  Vaporizer  Hydromet,,,,,,,, 1/2-1 teaspoon 3 times daily. For cough  Continue your current treatment program for your diabetes  Followup in 2 months

## 2013-11-10 NOTE — Progress Notes (Signed)
Pre visit review using our clinic review tool, if applicable. No additional management support is needed unless otherwise documented below in the visit note. 

## 2013-11-10 NOTE — Progress Notes (Signed)
   Subjective:    Patient ID: Dustin Vance, male    DOB: 1933/07/17, 80 y.o.   MRN: [id number]  HPI Dustin Vance is a 80 year old male comes in today accompanied by his wife for evaluation of diabetes type 2  We changed his insulin because his blood sugar is running in the 300 range. He's taking 15 units of regular before each meal and 30 units of long-acting at bedtime. Now his blood sugars are down in the 80-120 range if he does not eat the wrong foods.  He's had a cold for 3 days head congestion cough no fever chills etc.   Review of Systems Review of systems otherwise negative    Objective:   Physical Exam Well-developed well-nourished male no acute distress vital signs stable he is afebrile HEENT negative neck was supple no adenopathy lungs show symmetrical decreased breath sounds because he has underlying COPD otherwise normal except for crackles bilaterally which are persistent and not new       Assessment & Plan:  Diabetes type 1 improve control continue current therapy  Viral syndrome with underlying COPD lots of liquids humidification Hydromet

## 2013-11-12 ENCOUNTER — Other Ambulatory Visit: Payer: Self-pay | Admitting: Vascular Surgery

## 2013-11-12 DIAGNOSIS — Z48812 Encounter for surgical aftercare following surgery on the circulatory system: Secondary | ICD-10-CM

## 2013-11-12 DIAGNOSIS — I724 Aneurysm of artery of lower extremity: Secondary | ICD-10-CM

## 2013-12-15 ENCOUNTER — Encounter: Payer: Self-pay | Admitting: Family

## 2013-12-16 ENCOUNTER — Ambulatory Visit (INDEPENDENT_AMBULATORY_CARE_PROVIDER_SITE_OTHER)
Admission: RE | Admit: 2013-12-16 | Discharge: 2013-12-16 | Disposition: A | Discharge status: Home / Self Care | Payer: Medicare HMO | Source: Ambulatory Visit | Attending: Vascular Surgery | Admitting: Vascular Surgery

## 2013-12-16 ENCOUNTER — Ambulatory Visit: Payer: Medicare Other | Admitting: Family

## 2013-12-16 ENCOUNTER — Encounter: Payer: Self-pay | Admitting: Family

## 2013-12-16 ENCOUNTER — Ambulatory Visit (HOSPITAL_COMMUNITY)
Admission: RE | Admit: 2013-12-16 | Discharge: 2013-12-16 | Disposition: A | Discharge status: Home / Self Care | Payer: Medicare HMO | Source: Ambulatory Visit | Attending: Family | Admitting: Family

## 2013-12-16 ENCOUNTER — Ambulatory Visit (INDEPENDENT_AMBULATORY_CARE_PROVIDER_SITE_OTHER): Payer: Medicare HMO | Admitting: Family

## 2013-12-16 VITALS — BP 134/81 | HR 72 | Resp 16 | Ht 72.0 in | Wt 165.0 lb

## 2013-12-16 DIAGNOSIS — Z48812 Encounter for surgical aftercare following surgery on the circulatory system: Secondary | ICD-10-CM

## 2013-12-16 DIAGNOSIS — Z4889 Encounter for other specified surgical aftercare: Secondary | ICD-10-CM | POA: Insufficient documentation

## 2013-12-16 DIAGNOSIS — I724 Aneurysm of artery of lower extremity: Secondary | ICD-10-CM

## 2013-12-16 DIAGNOSIS — I739 Peripheral vascular disease, unspecified: Secondary | ICD-10-CM

## 2013-12-16 NOTE — Progress Notes (Signed)
[facility] [place]  Established Abdominal Aortic Aneurysm  History of Present Illness  Dustin Vance is a 80 y.o. (1933/10/10) male patient of Dr. Scot Dock who is s/p open AAA in 2005 and right popliteal aneurysm repair in 2004, who presents with chief complaint: follow up for popliteal aneurysm repair.    The patient does not have back or abdominal pain.  The patient is not a smoker. The patient denies claudication in legs with walking. The patient denies history of stroke or TIA symptoms, denies history of MI.  Pt Diabetic: Yes, A1C a month ago was 11.1, uncontrolled Pt smoker: former smoker, quit in 2010  Past Medical History  Diagnosis Date  . Hyperlipidemia   . AAA (abdominal aortic aneurysm)     status post repair in 2005  . Hx of colonic polyps   . CKD (chronic kidney disease)     last creatinine   . Oropharynx cancer   . Cancer 04/28/10    R base of tongue, inv squamous cell, HPV +  . Diabetes mellitus type I   . Hypertension   . COPD (chronic obstructive pulmonary disease)   . Hx of radiation therapy 06/01/10 to 07/21/10    R base of tongue  . Anemia     mild/chemo-induced   Past Surgical History  Procedure Laterality Date  . Tonsillectomy    . Colonoscopy w/ polypectomy    . Hemorrhoid surgery    . Inguinal hernia repair    . Penial implant    . Rotator cuff repair    . Appendectomy    . Abdominal aortic aneurysm repair     Social History History   Social History  . Marital Status: Married    Spouse Name: N/A    Number of Children: 3  . Years of Education: N/A   Occupational History  . Retired    Social History Main Topics  . Smoking status: Former Smoker -- 1.00 packs/day for 50 years    Types: Cigarettes    Quit date: 01/29/2009  . Smokeless tobacco: Never Used  . Alcohol Use: No  . Drug Use: No  . Sexual Activity: Not on file   Other Topics Concern  . Not on file   Social History Narrative  . No narrative on file    Family History Family History  Problem Relation Age of Onset  . Cancer Other   . Stroke Other   . Cancer Mother     unknown type  . Cancer Sister     lung  . Cancer Sister     brain  . Heart disease Father   . Heart attack Father   . Hypertension Father     Current Outpatient Prescriptions on File Prior to Visit  Medication Sig Dispense Refill  . aspirin 81 MG tablet Take 81 mg by mouth daily.        [name] atorvastatin (LIPITOR) 10 MG tablet Take 1 tablet (10 mg total) by mouth daily.  90 tablet  0  . Coenzyme Q10 (COQ10) 100 MG CAPS Take by mouth daily.        [name] donepezil (ARICEPT) 10 MG tablet Take 1 tablet (10 mg total) by mouth at bedtime.  15 tablet  1  . glucose blood (ONETOUCH VERIO) test strip Test twice daily, dx 250.01      . insulin glargine (LANTUS) 100 UNIT/ML injection Inject 0.2-0.25 mLs (20-25 Units total) into the skin at bedtime.  30 mL  3  . insulin lispro (HUMALOG) 100 UNIT/ML injection Inject 10 Units into the skin 3 (three) times daily before meals.  30 mL  3  . Multiple Vitamin (MULTIVITAMIN PO) Take by mouth daily.        [name] DELICA LANCETS FINE MISC Test twice daily, dx 250.01      . HYDROcodone-homatropine (HYCODAN) 5-1.5 MG/5ML syrup 1/2-1 teaspoon 3 times daily. For cough  240 mL  0  . insulin aspart (NOVOLOG) 100 UNIT/ML injection Inject 15 Units into the skin 3 (three) times daily before meals.  30 mL  0  . insulin detemir (LEVEMIR) 100 UNIT/ML injection Inject 0.3 mLs (30 Units total) into the skin at bedtime.  10 mL  0  . polyethylene glycol powder (GLYCOLAX/MIRALAX) powder Take 17 g by mouth as needed.       . rosuvastatin (CRESTOR) 5 MG tablet Take 1 tablet (5 mg total) by mouth 3 (three) times a week.  90 tablet  3   No current facility-administered medications on file prior to visit.   No Known Allergies  ROS: See HPI for pertinent positives and negatives.  Physical Examination  Filed Vitals:   12/16/13 1518  BP: 134/81  Pulse: 72   Resp: 16  Height: 6' (1.829 m)  Weight: 165 lb (74.844 kg)  SpO2: 97%   Body mass index is 22.37 kg/(m^2).  General: A&O x 3, WD, with wife.  Pulmonary: Sym exp, good air movt, CTAB, no rales, rhonchi, or wheezing.  Cardiac: RRR, Nl S1, S2, no detected murmur.   Carotid Bruits Left Right   Negative Negative   Aorta is  palpable Radial pulses are 2+ palpable and =                          VASCULAR EXAM:                                                                                                         LE Pulses LEFT RIGHT       FEMORAL   palpable   palpable        POPLITEAL  1+ palpable   1+ palpable       POSTERIOR TIBIAL   palpable    palpable        DORSALIS PEDIS      ANTERIOR TIBIAL  palpable   palpable      Gastrointestinal: soft, NTND, -G/R, - HSM, - masses, - CVAT B.  Musculoskeletal: M/S 5/5 throughout, Extremities without ischemic changes.  Neurologic: CN 2-12 intact, Pain and light touch intact in extremities are intact except, Motor exam as listed above.  Non-Invasive Vascular Imaging  Right LE arterial Duplex (12/16/2013): LOWER EXTREMITY ARTERIAL DUPLEX EVALUATION    INDICATION: Follow up bypass graft     PREVIOUS INTERVENTION(S): Right distal superficial femoral to distal popliteal artery bypass graft in 2004 Open repair of abdominal aortic aneurysm in 2005    DUPLEX EXAM:     RIGHT  LEFT   Peak Systolic Velocity (  cm/s) Ratio (if abnormal) Waveform  Peak Systolic Velocity (cm/s) Ratio (if abnormal) Waveform  45  B Inflow Artery     50  B Proximal Anastomosis     52  B Proximal Graft     62  B Mid Graft     67  T  Distal Graft     61  B Distal Anastomosis     62  B Outflow Artery     1.16 Today's ABI / TBI 1.27  1.19 Previous ABI / TBI (12/10/12 ) 1.27    Waveform:    M - Monophasic       B - Biphasic       T - Triphasic  If Ankle Brachial Index (ABI) or Toe Brachial Index (TBI) performed, please see complete report      ADDITIONAL FINDINGS: No internal vessel narrowing noted within the bypass graft or anastomosis.    IMPRESSION: Patent right leg bypass graft with no evidence of stenosis noted.    Compared to the previous exam:  No significant change when compared to the exam on 12/10/12.      Medical Decision Making  The patient is a 80 y.o. male who presents s/p AAA open repair in 2005 and right popliteal aneurysm repair in 2004,  with asymptomatic AAA  His DM is severely uncontrolled. I advised him to work closely with his provider that manages his DM to get this under control.   Based on this patient's exam and diagnostic studies, the patient will follow up in 1 year with RLE arterial Duplex and AAA Duplex, abdominal aorta is palpable, not bounding, no back or abdominal pain.    I emphasized the importance of maximal medical management including strict control of blood pressure, blood glucose, and lipid levels, antiplatelet agents, obtaining regular exercise, and continued cessation of smoking.   The patient was given information about AAA including signs, symptoms, treatment, and how to minimize the risk of enlargement and rupture of aneurysms.    The patient was advised to call 911 should the patient experience sudden onset abdominal or back pain.   Thank you for allowing [place] to participate in this patient's care.  Clemon Chambers, RN, MSN, FNP-C [facility] [place] Office: [phone]  Clinic Physician: Scot Dock  12/16/2013, 3:21 PM

## 2013-12-16 NOTE — Patient Instructions (Signed)

## 2013-12-17 NOTE — Addendum Note (Signed)
Addended by: Mena Goes on: 12/17/2013 04:13 PM   Modules accepted: Orders

## 2014-01-04 ENCOUNTER — Other Ambulatory Visit (INDEPENDENT_AMBULATORY_CARE_PROVIDER_SITE_OTHER): Payer: Medicare HMO

## 2014-01-04 DIAGNOSIS — E109 Type 1 diabetes mellitus without complications: Secondary | ICD-10-CM

## 2014-01-04 LAB — HEMOGLOBIN A1C: Hgb A1c MFr Bld: 9.4 % — ABNORMAL HIGH (ref 4.6–6.5)

## 2014-01-04 LAB — BASIC METABOLIC PANEL
BUN: 21 mg/dL (ref 6–23)
CALCIUM: 8.8 mg/dL (ref 8.4–10.5)
CO2: 33 mEq/L — ABNORMAL HIGH (ref 19–32)
Chloride: 105 mEq/L (ref 96–112)
Creatinine, Ser: 1.3 mg/dL (ref 0.4–1.5)
GFR: 55.22 mL/min — ABNORMAL LOW (ref >60.00)
Glucose, Bld: 182 mg/dL — ABNORMAL HIGH (ref 70–99)
Potassium: 4.3 mEq/L (ref 3.5–5.1)
SODIUM: 140 meq/L (ref 135–145)

## 2014-01-04 LAB — MICROALBUMIN / CREATININE URINE RATIO
Creatinine,U: 193.3 mg/dL
Microalb Creat Ratio: 6.4 mg/g (ref 0.0–30.0)
Microalb, Ur: 12.4 mg/dL — ABNORMAL HIGH (ref 0.0–1.9)

## 2014-01-11 ENCOUNTER — Ambulatory Visit: Payer: Medicare HMO | Admitting: Family Medicine

## 2014-01-18 ENCOUNTER — Encounter: Payer: Self-pay | Admitting: Family Medicine

## 2014-01-18 ENCOUNTER — Ambulatory Visit (INDEPENDENT_AMBULATORY_CARE_PROVIDER_SITE_OTHER): Payer: Medicare HMO | Admitting: Family Medicine

## 2014-01-18 DIAGNOSIS — E109 Type 1 diabetes mellitus without complications: Secondary | ICD-10-CM

## 2014-01-18 MED ORDER — GLUCOSE BLOOD VI STRP: ORAL_STRIP | Status: AC

## 2014-01-18 NOTE — Patient Instructions (Signed)
Continue the nightly dose of long-acting insulin to 30 units  20 units before breakfast, 15 units before lunch, 15 units before his evening meal  Return in 3 months for followup  Labs one week prior  Walk 30 minutes daily

## 2014-01-18 NOTE — Progress Notes (Signed)
Pre visit review using our clinic review tool, if applicable. No additional management support is needed unless otherwise documented below in the visit note. 

## 2014-01-18 NOTE — Progress Notes (Signed)
   Subjective:    Patient ID: Dustin Vance, male    DOB: 1933-01-12, 80 y.o.   MRN: [id number]  HPI Weight is a 80 year old married male who comes in today for followup of diabetes type 1  We've increased his insulin because his blood sugar was not at goal. He's currently on short acting insulin 15 units before each meal and long-acting 30 units at bedtime  He's had no nighttime hypoglycemia on this regime however his morning sugars tend to run in the 150-250 range sometimes  He's not walking on a daily basis   Review of Systems Review of systems negative no hypoglycemia    Objective:   Physical Exam Well-developed well nourished male no acute distress vital signs stable he is afebrile       Assessment & Plan:  Diabetes type 1 not at goal slowly increase the sliding scale insulin

## 2014-02-05 ENCOUNTER — Telehealth: Payer: Self-pay

## 2014-02-05 NOTE — Telephone Encounter (Signed)
Relevant patient education mailed to patient.  

## 2014-03-29 ENCOUNTER — Encounter: Payer: Self-pay | Admitting: Neurology

## 2014-03-29 ENCOUNTER — Ambulatory Visit (INDEPENDENT_AMBULATORY_CARE_PROVIDER_SITE_OTHER): Payer: Medicare HMO | Admitting: Neurology

## 2014-03-29 VITALS — BP 152/63 | HR 86 | Temp 96.0°F | Ht 72.0 in | Wt 169.0 lb

## 2014-03-29 DIAGNOSIS — R413 Other amnesia: Secondary | ICD-10-CM

## 2014-03-29 DIAGNOSIS — E1149 Type 2 diabetes mellitus with other diabetic neurological complication: Secondary | ICD-10-CM

## 2014-03-29 DIAGNOSIS — G609 Hereditary and idiopathic neuropathy, unspecified: Secondary | ICD-10-CM

## 2014-03-29 MED ORDER — DONEPEZIL HCL 10 MG PO TABS: 10.0000 mg | ORAL_TABLET | Freq: Every day | ORAL | Status: DC

## 2014-03-29 NOTE — Patient Instructions (Addendum)
Please try to take your donepezil in the evening or at bedtime.  Please drink more water.  Reduce your caffeine intake.

## 2014-03-29 NOTE — Progress Notes (Signed)
Subjective:    Patient ID: Dustin Vance is a 80 y.o. male.  HPI    Interim history:   Dustin Vance is a very pleasant 80 year old right-handed gentleman with an underlying medical history of diabetes, oral pharyngeal carcinoma, status post radiation therapy as well as chemotherapy in August and October 2011, hyperlipidemia, hypertension and isolated syncopal episode in the past, who presents for followup consultation of his memory loss. The patient is accompanied by his wife again today. I last saw him on 09/28/2013, at which time I felt he was fairly stable and had remained in the [facility] arena. He was tolerating Aricept generic 10 mg once daily and I continue him on that dose. He reported that his Crestor was changed to 3 times per week due to leg cramping. Crestor is now 3/week, d/t leg cramping reported. He felt that his neuropathy was stable. He reported some trouble with blood sugar control. His wife reported that he did not exercise regularly.   Today, he reported feeling hypoglycemic in the waiting room, he was given some juice and felt better. His fasting blood sugar this morning was 135. He had a stomach pain one day a month ago, which improved within 24 hours. No recent illness and no recent change in medications. He feels his memory is stable and his wife endorses it. She has noted some mood irritability. He likes to watch TV. He had cold sweats earlier and some tremulousness in the context of feeling hypoglycemic and this improved by the time we concluded our office visit. He denied any one-sided weakness or numbness. His wife felt that he did eat a little less for breakfast this morning.  I first met him on 04/08/2013 at which time I encouraged him to continue with the Aricept 10 mg strength. I did not order any new test and I felt that he was stable.  He previously followed with Dr. Morene Antu and was last seen by him on 09/18/2012, at which time Dr. Erling Cruz felt the patient was stable and  doing well. His MMSE was improved and he was encouraged to exercise.  He has an approximately 4+ year history of forgetfulness and memory loss. There is no family history of dementia. There is no personal history of head trauma, or substance abuse. He has been diabetic for many years. In April 2012 his MMSE was 28, his brain MRI showed Sylvian, mesial temporal and diffuse atrophy with enlarged ventricles and mild to moderate chronic small vessel disease.  B12 level was 302 and RPR was negative. In August 2012 his MMSE was 27, clock drawing was 4, animal fluency was 12. In June 2013 his MMSE was 27, clock drawing was 3, animal fluency was 15. In December 2013 his MMSE was 30, clock drawing was 4, animal fluency was 11.    His Past Medical History Is Significant For: Past Medical History  Diagnosis Date  . Hyperlipidemia   . AAA (abdominal aortic aneurysm)     status post repair in 2005  . Hx of colonic polyps   . CKD (chronic kidney disease)     last creatinine   . Oropharynx cancer   . Cancer 04/28/10    R base of tongue, inv squamous cell, HPV +  . Diabetes mellitus type I   . Hypertension   . COPD (chronic obstructive pulmonary disease)   . Hx of radiation therapy 06/01/10 to 07/21/10    R base of tongue  . Anemia  mild/chemo-induced    His Past Surgical History Is Significant For: Past Surgical History  Procedure Laterality Date  . Tonsillectomy    . Colonoscopy w/ polypectomy    . Hemorrhoid surgery    . Inguinal hernia repair    . Penial implant    . Rotator cuff repair    . Appendectomy    . Abdominal aortic aneurysm repair      His Family History Is Significant For: Family History  Problem Relation Age of Onset  . Cancer Other   . Stroke Other   . Cancer Mother     unknown type  . Cancer Sister     lung  . Cancer Sister     brain  . Heart disease Father   . Heart attack Father   . Hypertension Father     His Social History Is Significant For: History    Social History  . Marital Status: Married    Spouse Name: N/A    Number of Children: 3  . Years of Education: N/A   Occupational History  . Retired    Social History Main Topics  . Smoking status: Former Smoker -- 1.00 packs/day for 50 years    Types: Cigarettes    Quit date: 01/29/2009  . Smokeless tobacco: Never Used  . Alcohol Use: No  . Drug Use: No  . Sexual Activity: None   Other Topics Concern  . None   Social History Narrative  . None    His Allergies Are:  No Known Allergies:   His Current Medications Are:  Outpatient Encounter Prescriptions as of 03/29/2014  Medication Sig  . aspirin 81 MG tablet Take 81 mg by mouth daily.    . Coenzyme Q10 (COQ10) 100 MG CAPS Take by mouth daily.    [name] donepezil (ARICEPT) 10 MG tablet Take 1 tablet (10 mg total) by mouth at bedtime.  [name] glucose blood (ONETOUCH VERIO) test strip Test twice daily, dx 250.01  . insulin aspart (NOVOLOG) 100 UNIT/ML injection Inject 15 Units into the skin 3 (three) times daily before meals.  . insulin detemir (LEVEMIR) 100 UNIT/ML injection Inject 0.3 mLs (30 Units total) into the skin at bedtime.  . Multiple Vitamin (MULTIVITAMIN PO) Take by mouth daily.    [name] DELICA LANCETS FINE MISC Test twice daily, dx 250.01  . polyethylene glycol powder (GLYCOLAX/MIRALAX) powder Take 17 g by mouth as needed.   . rosuvastatin (CRESTOR) 5 MG tablet Take 1 tablet (5 mg total) by mouth 3 (three) times a week.  . [DISCONTINUED] atorvastatin (LIPITOR) 10 MG tablet Take 1 tablet (10 mg total) by mouth daily.  :  Review of Systems:  Out of a complete 14 point review of systems, all are reviewed and negative with the exception of these symptoms as listed below:  Review of Systems  Constitutional: Negative.   HENT: Positive for trouble swallowing.   Eyes: Negative.   Respiratory: Negative.   Cardiovascular: Negative.   Gastrointestinal: Negative.   Endocrine: Negative.   Genitourinary: Positive for  frequency and enuresis.  Musculoskeletal:       Muscle cramps  Skin: Negative.   Allergic/Immunologic: Negative.   Neurological: Negative.   Hematological: Bruises/bleeds easily.  Psychiatric/Behavioral: Positive for agitation.    Objective:  Neurologic Exam  Physical Exam Physical Examination:   Filed Vitals:   03/29/14 1133  BP: 152/63  Pulse: 86  Temp: 96 F (35.6 C)   General Examination: The patient is a very pleasant 80  y.o. male in no acute distress. He is calm and cooperative with the exam. He denies Auditory Hallucinations and Visual Hallucinations. He was sweaty earlier but no longer has any diaphoresis. His shirt felt damp.  HEENT: Normocephalic, atraumatic, pupils are equal, round and reactive to light and accommodation. Funduscopic exam is normal with sharp disc margins noted. Extraocular tracking shows mild saccadic breakdown without nystagmus noted. Hearing is intact with bilateral hearing aids in place. Tympanic membranes are clear bilaterally. Face is symmetric with no facial masking and normal facial sensation. There is no lip, neck or jaw tremor. Neck is not rigid with intact passive ROM. There are no carotid bruits on auscultation. Oropharynx exam reveals mild mouth dryness. No significant airway crowding is noted. Mallampati is class II. Tongue protrudes centrally and palate elevates symmetrically. He is status post chemo and radiation.    Chest: is clear to auscultation without wheezing, rhonchi or crackles noted.  Heart: sounds are regular and normal without murmurs, rubs or gallops noted.   Abdomen: is soft, non-tender and non-distended with normal bowel sounds appreciated on auscultation.  Extremities: There is trace pitting edema in the distal lower extremities bilaterally, around the ankles. Pedal pulses are intact.  Skin: is warm and dry with no trophic changes noted.  Musculoskeletal: exam reveals no obvious joint deformities, tenderness or joint  swelling or erythema.  Neurologically:  Mental status: The patient is awake and alert, paying good  attention. He is able to provide the history. His wife provides details. He is oriented to: person, place, time/date, situation, day of week, month of year and year. His memory, attention, language and knowledge are impaired very mildly. There is no aphasia, agnosia, apraxia or anomia. There is a no degree of bradyphrenia. Speech is mildly hypophonic with mild dysarthria noted, d/t cancer treatment. Mood is congruent and affect is normal.  His MMSE score on 04/08/13 was: 29/30, CDT was 4/4, AFT (Animal Fluency Test) score was 10.  His MMSE score on 03/29/14: 29/30, CDT 4/4, AFT was 13.  His handwriting was a little more tremulous, in keeping with a recent hypoglycemic episode.  Cranial nerves are as described above under HEENT exam. In addition, shoulder shrug is normal with equal shoulder height noted.  Motor exam: Normal bulk, and strength for age is noted. Tone is Not rigid with absence of cogwheeling. There is overall no bradykinesia. There is no drift or rebound. There is no tremor.   Romberg is negative. Reflexes are 2+ in the upper extremities and 2+ in the lower extremities. Fine motor skills: Finger taps, hand movements, and rapid alternating patting are not impaired bilaterally. Foot taps and foot agility are not impaired bilaterally.   Cerebellar testing shows no dysmetria or intention tremor on finger to nose testing. Heel to shin is unremarkable. There is no truncal or gait ataxia.   Sensory exam is intact to light touch, pinprick, vibration, temperature sense in the upper extremities and very mildly decreased in the lower extremities for PP, temperature and vibration sense to above the ankles bilaterally.   Gait, station and balance: He stands up from the seated position with no difficulty and needs. No veering to one side is noted. No leaning to one side. Posture is mildly stooped, but  age-appropriate. Stance is narrow-based. He turns in 2 steps. Balance is not impaired.   Assessment and Plan:   In summary, Dustin Vance is a very pleasant 80 year old male with an underlying medical history of diabetes, oral pharyngeal  carcinoma, status post radiation therapy as well as chemotherapy in August and October 2011, hyperlipidemia, hypertension and isolated syncopal episode in the past, who presents for followup consultation of his memory loss. He has stable findings and continues to score in the realm of mild cognitive impairment. He has remained fairly stable in the past year or so and tolerates Aricept generic 10 mg once daily. I suggested that he continue with his current medication. I did encourage him to drink more water. His favorite beverage is coffee throughout the day and water. His neuropathy which is likely diabetic in etiology seems to be stable as well. We again talked about maintaining a healthy lifestyle in general and staying active mentally and physically, and the importance of good glucose control and the need to avoid hypoglycemia which can be more deleterious than hyperglycemia. I encouraged the patient to eat healthy, exercise daily and keep well hydrated, to keep a scheduled bedtime and wake time routine, to not skip any meals and eat healthy snacks in between meals and to have protein with every meal. I stressed the importance of regular exercise, within of course the patient's own mobility limitations. I encouraged the patient to keep up with current events by reading the news paper or watching the news. I suggested that he walk regularly.  As far as further diagnostic testing is concerned, I suggested the following: no change.  As far as medications are concerned, I recommended the following at this time: no change. I renewed a 90 day prescription for Aricept generic. I answered all their questions today and the patient and his wife were in agreement with the above  outlined plan. I would like to see the patient back in 6 months, sooner if the need arises and encouraged them to call with any interim questions, concerns, problems, updates and refill requests.

## 2014-03-31 ENCOUNTER — Other Ambulatory Visit: Payer: Self-pay | Admitting: Hematology and Oncology

## 2014-03-31 ENCOUNTER — Other Ambulatory Visit: Payer: Medicare HMO

## 2014-03-31 ENCOUNTER — Encounter: Payer: Self-pay | Admitting: Hematology and Oncology

## 2014-03-31 ENCOUNTER — Ambulatory Visit (HOSPITAL_BASED_OUTPATIENT_CLINIC_OR_DEPARTMENT_OTHER): Payer: Medicare HMO | Admitting: Hematology and Oncology

## 2014-03-31 VITALS — BP 145/57 | HR 77 | Temp 97.1°F | Resp 18 | Ht 72.0 in | Wt 169.9 lb

## 2014-03-31 DIAGNOSIS — K117 Disturbances of salivary secretion: Secondary | ICD-10-CM

## 2014-03-31 DIAGNOSIS — C01 Malignant neoplasm of base of tongue: Secondary | ICD-10-CM

## 2014-03-31 DIAGNOSIS — C109 Malignant neoplasm of oropharynx, unspecified: Secondary | ICD-10-CM

## 2014-03-31 DIAGNOSIS — R1310 Dysphagia, unspecified: Secondary | ICD-10-CM

## 2014-03-31 NOTE — Progress Notes (Signed)
[facility] FOLLOW-UP progress notes  Patient Care Team: Dorena Cookey, MD as PCP - General Thornell Sartorius, MD Marye Round, MD (Radiation Oncology)  CHIEF COMPLAINTS/PURPOSE OF VISIT:  Tonsil cancer  HISTORY OF PRESENTING ILLNESS:  Dustin Vance 80 y.o. male was transferred to my care after his prior physician has left.  I reviewed the patient's records extensive and collaborated the history with the patient. Summary of his history is as follows: He was diagnosed with a tonsil cancer after presenting with a palpable lump in the neck. 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  He was treated with concurrent definitive chemoXRT with weekly Carboplatin/Taxol and daily XRT started on 05/31/10 finished in Oct 2011.   Willodean Rosenthal returns for regular follow up with his wife.  He still has xerostomia; however this is improved compared to before.  He still has dysphagia with dried foods such as breads or chicken. He denies recent choking.  He denies new palpable lymphadenopathy. He denies smoking cigarettes, chewing tobacco, taking alcohol.  MEDICAL HISTORY:  Past Medical History  Diagnosis Date  . Hyperlipidemia   . AAA (abdominal aortic aneurysm)     status post repair in 2005  . Hx of colonic polyps   . CKD (chronic kidney disease)     last creatinine   . Oropharynx cancer   . Cancer 04/28/10    R base of tongue, inv squamous cell, HPV +  . Diabetes mellitus type I   . Hypertension   . COPD (chronic obstructive pulmonary disease)   . Hx of radiation therapy 06/01/10 to 07/21/10    R base of tongue  . Anemia     mild/chemo-induced    SURGICAL HISTORY: Past Surgical History  Procedure Laterality Date  . Tonsillectomy    . Colonoscopy w/ polypectomy    . Hemorrhoid surgery    . Inguinal hernia repair    . Penial implant    . Rotator cuff repair    .  Appendectomy    . Abdominal aortic aneurysm repair      SOCIAL HISTORY: History   Social History  . Marital Status: Married    Spouse Name: N/A    Number of Children: 3  . Years of Education: N/A   Occupational History  . Retired    Social History Main Topics  . Smoking status: Former Smoker -- 1.00 packs/day for 50 years    Types: Cigarettes    Quit date: 01/29/2009  . Smokeless tobacco: Never Used  . Alcohol Use: No  . Drug Use: No  . Sexual Activity: Not on file   Other Topics Concern  . Not on file   Social History Narrative  . No narrative on file    FAMILY HISTORY: Family History  Problem Relation Age of Onset  . Cancer Other   . Stroke Other   . Cancer Mother     unknown type  . Cancer Sister     lung  . Cancer Sister     brain  . Heart disease Father   . Heart attack Father   . Hypertension Father     ALLERGIES:  has No Known Allergies.  MEDICATIONS:  Current Outpatient Prescriptions  Medication Sig Dispense Refill  . aspirin 81 MG tablet Take 81 mg by mouth daily.        . Coenzyme Q10 (COQ10) 100  MG CAPS Take by mouth daily.        [name] donepezil (ARICEPT) 10 MG tablet Take 1 tablet (10 mg total) by mouth at bedtime.  90 tablet  3  . glucose blood (ONETOUCH VERIO) test strip Test twice daily, dx 250.01  200 each  3  . insulin aspart (NOVOLOG) 100 UNIT/ML injection Inject 15 Units into the skin 3 (three) times daily before meals.  30 mL  0  . insulin detemir (LEVEMIR) 100 UNIT/ML injection Inject 0.3 mLs (30 Units total) into the skin at bedtime.  10 mL  0  . Multiple Vitamin (MULTIVITAMIN PO) Take by mouth daily.        [name] DELICA LANCETS FINE MISC Test twice daily, dx 250.01      . polyethylene glycol powder (GLYCOLAX/MIRALAX) powder Take 17 g by mouth as needed.       . rosuvastatin (CRESTOR) 5 MG tablet Take 1 tablet (5 mg total) by mouth 3 (three) times a week.  90 tablet  3   No current facility-administered medications for this  visit.    REVIEW OF SYSTEMS:   Constitutional: Denies fevers, chills or abnormal night sweats Eyes: Denies blurriness of vision, double vision or watery eyes Ears, nose, mouth, throat, and face: Denies mucositis or sore throat Respiratory: Denies cough, dyspnea or wheezes Cardiovascular: Denies palpitation, chest discomfort or lower extremity swelling Gastrointestinal:  Denies nausea, heartburn or change in bowel habits Skin: Denies abnormal skin rashes Lymphatics: Denies new lymphadenopathy or easy bruising  Neurological:Denies numbness, tingling or new weaknesses Behavioral/Psych: Mood is stable, no new changes  All other systems were reviewed with the patient and are negative.  PHYSICAL EXAMINATION: ECOG PERFORMANCE STATUS: 0 - Asymptomatic  Filed Vitals:   03/31/14 1028  BP: 145/57  Pulse: 77  Temp: 97.1 F (36.2 C)  Resp: 18   Filed Weights   03/31/14 1028  Weight: 169 lb 14.4 oz (77.066 kg)    GENERAL:alert, no distress and comfortable SKIN: skin color, texture, turgor are normal, no rashes or significant lesions EYES: normal, conjunctiva are pink and non-injected, sclera clear OROPHARYNX:no exudate, normal lips, buccal mucosa, and tongue . Poor dentition is noted NECK: supple, thyroid normal size, non-tender, without nodularity well-healed surgical scar LYMPH:  no palpable lymphadenopathy in the cervical, axillary or inguinal LUNGS: clear to auscultation and percussion with normal breathing effort HEART: regular rate & rhythm and no murmurs without lower extremity edema ABDOMEN:abdomen soft, non-tender and normal bowel sounds Musculoskeletal:no cyanosis of digits and no clubbing  PSYCH: alert & oriented x 3 with fluent speech NEURO: no focal motor/sensory deficits  LABORATORY DATA:  I have reviewed the data as listed Lab Results  Component Value Date   WBC 6.9 07/10/2013   HGB 13.3 07/10/2013   HCT 39.6 07/10/2013   MCV 90.9 07/10/2013   PLT 235.0 07/10/2013     Recent Labs  07/10/13 0932 10/20/13 1122 01/04/14 0956  NA 144 138 140  K 4.3 4.5 4.3  CL 105 105 105  CO2 29 26 33*  GLUCOSE 155* 258* 182*  BUN 18 22 21   CREATININE 1.2 1.4 1.3  CALCIUM 9.1 8.5 8.8  PROT 7.3  --   --   ALBUMIN 3.4*  --   --   AST 13  --   --   ALT 17  --   --   ALKPHOS 78  --   --   BILITOT 0.9  --   --   [employer]  0.1  --   --     ASSESSMENT & PLAN:  Oropharynx cancer Clinically, he has no signs of disease recurrence. I will discharge him from the medical oncology clinic and recommend followup at the radiation oncology clinic and ENT.   I recommend yearly TSH check with his primary care physician. All questions were answered. The patient knows to call the clinic with any problems, questions or concerns. I spent 15 minutes counseling the patient face to face. The total time spent in the appointment was 20 minutes and more than 50% was on counseling.     [facility], NI, MD 03/31/2014 8:20 PM

## 2014-03-31 NOTE — Assessment & Plan Note (Signed)
Clinically, he has no signs of disease recurrence. I will discharge him from the medical oncology clinic and recommend followup at the radiation oncology clinic and ENT.

## 2014-04-02 ENCOUNTER — Telehealth: Payer: Self-pay | Admitting: Family Medicine

## 2014-04-02 NOTE — Telephone Encounter (Signed)
[place] [place] is requesting re-fill on insulin detemir (LEVEMIR) 100 UNIT/ML injection

## 2014-04-05 ENCOUNTER — Telehealth: Payer: Self-pay | Admitting: Family Medicine

## 2014-04-05 DIAGNOSIS — C109 Malignant neoplasm of oropharynx, unspecified: Secondary | ICD-10-CM

## 2014-04-05 DIAGNOSIS — R5383 Other fatigue: Secondary | ICD-10-CM

## 2014-04-05 MED ORDER — INSULIN DETEMIR 100 UNIT/ML ~~LOC~~ SOLN: 30.0000 [IU] | Freq: Every day | SUBCUTANEOUS | Status: DC

## 2014-04-05 NOTE — Telephone Encounter (Signed)
Lab order placed.

## 2014-04-05 NOTE — Telephone Encounter (Signed)
Pt called and said her husband saw his cancer dr Alvy Bimler  and she request that a   TSH been done since pt is having labs done 04/12/14 can you add this please

## 2014-04-12 ENCOUNTER — Other Ambulatory Visit (INDEPENDENT_AMBULATORY_CARE_PROVIDER_SITE_OTHER): Payer: Medicare HMO

## 2014-04-12 DIAGNOSIS — E109 Type 1 diabetes mellitus without complications: Secondary | ICD-10-CM

## 2014-04-12 DIAGNOSIS — R5383 Other fatigue: Secondary | ICD-10-CM

## 2014-04-12 DIAGNOSIS — C109 Malignant neoplasm of oropharynx, unspecified: Secondary | ICD-10-CM

## 2014-04-12 LAB — BASIC METABOLIC PANEL
BUN: 21 mg/dL (ref 6–23)
CHLORIDE: 105 meq/L (ref 96–112)
CO2: 27 mEq/L (ref 19–32)
Calcium: 8.8 mg/dL (ref 8.4–10.5)
Creatinine, Ser: 1.5 mg/dL (ref 0.4–1.5)
GFR: 49.17 mL/min — AB (ref >60.00)
GLUCOSE: 99 mg/dL (ref 70–99)
POTASSIUM: 4.2 meq/L (ref 3.5–5.1)
Sodium: 140 mEq/L (ref 135–145)

## 2014-04-12 LAB — HEMOGLOBIN A1C: Hgb A1c MFr Bld: 9.4 % — ABNORMAL HIGH (ref 4.6–6.5)

## 2014-04-13 LAB — TSH: TSH: 2.782 u[IU]/mL (ref 0.350–4.500)

## 2014-04-19 ENCOUNTER — Ambulatory Visit: Payer: Medicare HMO | Admitting: Family Medicine

## 2014-04-21 ENCOUNTER — Encounter: Payer: Self-pay | Admitting: Radiation Oncology

## 2014-04-21 ENCOUNTER — Ambulatory Visit
Admission: RE | Admit: 2014-04-21 | Discharge: 2014-04-21 | Disposition: A | Discharge status: Home / Self Care | Payer: Medicare HMO | Source: Ambulatory Visit | Attending: Radiation Oncology | Admitting: Radiation Oncology

## 2014-04-21 VITALS — BP 132/58 | HR 79 | Temp 97.7°F | Resp 20 | Ht 72.0 in | Wt 172.0 lb

## 2014-04-21 DIAGNOSIS — Z85819 Personal history of malignant neoplasm of unspecified site of lip, oral cavity, and pharynx: Secondary | ICD-10-CM | POA: Insufficient documentation

## 2014-04-21 DIAGNOSIS — Z7982 Long term (current) use of aspirin: Secondary | ICD-10-CM | POA: Insufficient documentation

## 2014-04-21 DIAGNOSIS — C109 Malignant neoplasm of oropharynx, unspecified: Secondary | ICD-10-CM

## 2014-04-21 DIAGNOSIS — C024 Malignant neoplasm of lingual tonsil: Secondary | ICD-10-CM

## 2014-04-21 DIAGNOSIS — Z794 Long term (current) use of insulin: Secondary | ICD-10-CM | POA: Insufficient documentation

## 2014-04-21 DIAGNOSIS — Z923 Personal history of irradiation: Secondary | ICD-10-CM | POA: Insufficient documentation

## 2014-04-21 MED ORDER — LARYNGOSCOPY SOLUTION RAD-ONC
15.0000 mL | Freq: Once | TOPICAL | Status: AC
  Administered 2014-04-21: 15 mL via TOPICAL
  Filled 2014-04-21: qty 15

## 2014-04-21 NOTE — Progress Notes (Addendum)
Follow up s/p radiation to oropharynx 05/22/10-07/21/10, last TSH 04/12/14=2.782, eats most foods, except biscuits, breads, some cornbreads, has to chew more with foods, appetite good though, blood sugar 74 this am, no nausea, gets dizzy aftr taking hot showers past 2-3 times,  Bruises on left arm, is taking aspirin, energy level so so comes and goes, mows yard only, drinking enough fluids stated main c/o incontinence of bowels and bladder 2:05 PM

## 2014-04-21 NOTE — Progress Notes (Signed)
  Radiation Oncology         [phone] ________________________________  Name: Dustin Vance MRN: [id number]  Date: 04/21/2014  DOB: 04-01-1933  Follow-Up Visit Note  CC: Joycelyn Man, MD  Dorena Cookey, MD  Diagnosis:   T2N2cM0   Interval Since Last Radiation:  3-1/2 years   Narrative:  The patient returns today for routine follow-up.  The patient states he clinically has been stable. The patient notes some occasional dysphagia but no significant change in this. No pain with swallowing. No new head and neck pain. The patient continues to complain of some xerostomia but this again is much improved as compared to what it originally was after treatment.                              ALLERGIES:  has No Known Allergies.  Meds: Current Outpatient Prescriptions  Medication Sig Dispense Refill  . aspirin 81 MG tablet Take 81 mg by mouth daily.        . Coenzyme Q10 (COQ10) 100 MG CAPS Take by mouth daily.        [name] donepezil (ARICEPT) 10 MG tablet Take 1 tablet (10 mg total) by mouth at bedtime.  90 tablet  3  . glucose blood (ONETOUCH VERIO) test strip Test twice daily, dx 250.01  200 each  3  . insulin aspart (NOVOLOG) 100 UNIT/ML injection Inject 15 Units into the skin 3 (three) times daily before meals.  30 mL  0  . insulin detemir (LEVEMIR) 100 UNIT/ML injection Inject 0.3 mLs (30 Units total) into the skin at bedtime.  30 mL  3  . Multiple Vitamin (MULTIVITAMIN PO) Take by mouth daily.        [name] DELICA LANCETS FINE MISC Test twice daily, dx 250.01      . rosuvastatin (CRESTOR) 5 MG tablet Take 1 tablet (5 mg total) by mouth 3 (three) times a week.  90 tablet  3   No current facility-administered medications for this encounter.    Physical Findings: The patient is in no acute distress. Patient is alert and oriented.  height is 6' (1.829 m) and weight is 172 lb (78.019 kg). His oral temperature is 97.7 F (36.5 C). His blood pressure is 132/58 and his pulse is 79.  His respiration is 20 and oxygen saturation is 94%. .   General: Well-developed, in no acute distress HEENT: Normocephalic, atraumatic, the neck shows some moderate fibrosis with the skin had healed very well; oral cavity clear Cardiovascular: Regular rate and rhythm Respiratory: Clear to auscultation bilaterally GI: Soft, nontender, normal bowel sounds Extremities: No edema present  Fiberoptic exam: After the use of topical anesthetic, the flexible laryngoscope was passed through the right near. Good visualization was obtained. No lesions or suspicious findings within the larynx, hypopharynx, oropharynx or nasopharynx.    Lab Findings: Lab Results  Component Value Date   WBC 6.9 07/10/2013   HGB 13.3 07/10/2013   HCT 39.6 07/10/2013   MCV 90.9 07/10/2013   PLT 235.0 07/10/2013     Radiographic Findings: No results found.  Impression:    The patient clinically is doing well. No concerning findings on exam today.  Plan:  Followup in 6 months.   Jodelle Gross, M.D., Ph.D.

## 2014-04-21 NOTE — Progress Notes (Signed)
Patient given  neo synephrine solution to both nares, MD  laryngscoped patient , right nare, patioent tolerated well 2:28 PM

## 2014-04-22 ENCOUNTER — Encounter: Payer: Self-pay | Admitting: Family Medicine

## 2014-04-22 ENCOUNTER — Ambulatory Visit (INDEPENDENT_AMBULATORY_CARE_PROVIDER_SITE_OTHER): Payer: Medicare HMO | Admitting: Family Medicine

## 2014-04-22 VITALS — BP 160/64 | Temp 98.0°F | Wt 175.0 lb

## 2014-04-22 DIAGNOSIS — E109 Type 1 diabetes mellitus without complications: Secondary | ICD-10-CM

## 2014-04-22 NOTE — Progress Notes (Signed)
   Subjective:    Patient ID: Dustin Vance, male    DOB: [birth year], 80 y.o.   MRN: [id number]  HPI Dustin Vance is a 80 year old male who comes in today accompanied by his wife because he has a lot of underlying medical problems some of which are related to his memory for followup of diabetes  He's on insulin before each meal and a long-acting dose daily however A1c is still 9.4%. He seen Jettie Booze in the past who has helped control his blood sugar   Review of Systems Review of systems otherwise negative    Objective:   Physical Exam  Well-developed well-nourished male in no acute distress vital signs stable he is afebrile      Assessment & Plan:  Diabetes type 1......... not at goal........[name] reconsult with Jettie Booze

## 2014-04-22 NOTE — Progress Notes (Signed)
Pre visit review using our clinic review tool, if applicable. No additional management support is needed unless otherwise documented below in the visit note. 

## 2014-04-22 NOTE — Patient Instructions (Signed)
Continue your current medications  We will set you up a reconsult with Jettie Booze to help get your blood sugar back to normal

## 2014-04-28 ENCOUNTER — Ambulatory Visit: Payer: Managed Care, Other (non HMO) | Admitting: Radiation Oncology

## 2014-04-28 ENCOUNTER — Encounter: Payer: Medicare HMO | Attending: Family Medicine | Admitting: Nutrition

## 2014-04-28 DIAGNOSIS — E1065 Type 1 diabetes mellitus with hyperglycemia: Secondary | ICD-10-CM | POA: Diagnosis not present

## 2014-04-28 DIAGNOSIS — Z794 Long term (current) use of insulin: Secondary | ICD-10-CM | POA: Diagnosis not present

## 2014-04-28 DIAGNOSIS — IMO0002 Reserved for concepts with insufficient information to code with codable children: Secondary | ICD-10-CM | POA: Diagnosis not present

## 2014-04-28 NOTE — Patient Instructions (Signed)
Test twice a day-before breakfast and one other time--either before lunch, or before supper, or before bedtime, and to vary the time each day. If eating a cream filled oatmeal cookie add 2 extra units to lunch time insulin dose

## 2014-04-28 NOTE — Progress Notes (Signed)
Mr. Dustin Vance is here today with his wife to review diet and blood sugar reading. His wife said that the patient had a severe low blood sugar last night, playing cards with his friend.  He had eaten only vegetables for supper meal.  Mr. Dustin Vance said he realized he should have eaten "more--some protein and carbs" and that "he will not do that anymore.  We discussed the need to take less insulin before meals that have fewer carbs, and stressed the need to eat at least one serving, some protein.  Suggestions for proteins he could have added to that meal were given to him.  He realized what he did and promised to "not do that anymore".    Insulin dose:  Levemir 25u at HS,  Novolog: 15u ac meals.  His wife is drawing up the insulin dose and leaving it by his plate before meals, and putting the syringe of levemir by his bedside table at HS.  Patient reports that he has been forgetting a lot more since his Chemotherapy.  His wife says he was loosing his memory a little before the Chemo.   Diet:  Meals are usually balanced with 45 grams of carbohydrate.         Breakfast is sausage and egg biscuit, or egg and cheese biscuit.          Lunch is 12 cracker sandwiches with pimento cheese, or ham sandwich with a glass of milk or coffee.          Mid afternoon snack:  Every other day a large oatmeal cream cookie.          Supper of usually 3 ounces of protein in the form of meat, or beans, with 3 servings of veg., ( one usually starchy)        He will usually not snack after supper.  SBGM:  He did not bring his meter with him.  His wife said that his FBS today was 63, and that it usually runs between 83-150s, with a high one, 1-2 times a week in the 200s  They are only testing once a day.   I explained that the HgbA1C is over 9% meaning that his blood sugars are rising at times we do no see.  I encouraged them to test his blood sugar at least one other time: before a meal or at bedtime, and to vary the time each day.  I  explained that I will call him in one week to see if we can determine when the blood sugars are rising, to make some adjustments in the insulin/meal coverage.  They agreed to do this and had no final questions.

## 2014-05-05 ENCOUNTER — Other Ambulatory Visit: Payer: Self-pay | Admitting: *Deleted

## 2014-05-05 DIAGNOSIS — M7989 Other specified soft tissue disorders: Secondary | ICD-10-CM

## 2014-05-10 ENCOUNTER — Other Ambulatory Visit: Payer: Self-pay | Admitting: Family Medicine

## 2014-05-11 ENCOUNTER — Encounter: Payer: Self-pay | Admitting: Family

## 2014-05-11 ENCOUNTER — Ambulatory Visit (HOSPITAL_COMMUNITY)
Admission: RE | Admit: 2014-05-11 | Discharge: 2014-05-11 | Disposition: A | Discharge status: Home / Self Care | Payer: Medicare HMO | Source: Ambulatory Visit | Attending: Vascular Surgery | Admitting: Vascular Surgery

## 2014-05-11 ENCOUNTER — Ambulatory Visit (INDEPENDENT_AMBULATORY_CARE_PROVIDER_SITE_OTHER): Payer: Medicare HMO | Admitting: Family

## 2014-05-11 ENCOUNTER — Telehealth: Payer: Self-pay | Admitting: Family

## 2014-05-11 VITALS — BP 134/73 | HR 78 | Temp 97.6°F | Resp 16 | Ht 71.0 in | Wt 170.0 lb

## 2014-05-11 DIAGNOSIS — M7989 Other specified soft tissue disorders: Secondary | ICD-10-CM

## 2014-05-11 DIAGNOSIS — I82402 Acute embolism and thrombosis of unspecified deep veins of left lower extremity: Secondary | ICD-10-CM

## 2014-05-11 DIAGNOSIS — I82409 Acute embolism and thrombosis of unspecified deep veins of unspecified lower extremity: Secondary | ICD-10-CM

## 2014-05-11 MED ORDER — RIVAROXABAN 20 MG PO TABS: 20.0000 mg | ORAL_TABLET | Freq: Every day | ORAL | Status: DC

## 2014-05-11 NOTE — Telephone Encounter (Signed)
Left message on machine for wife to call back and left a detailed message to take Xarelto 15 mg twice daily and samples were available.

## 2014-05-11 NOTE — Patient Instructions (Signed)

## 2014-05-11 NOTE — Progress Notes (Addendum)
[facility] [place]    History of Present Illness  Dustin Vance is a 80 y.o. male patient of Dr. Scot Dock who is s/p open AAA in 2005 and right popliteal aneurysm repair in 2004. He returns today for new complaint of bilateral edema in feet and ankles for a month. This is worse in the morning, not as noticeable during the day, has a tight feeling in both feet. He denies shortness of breath, denies cough, denies non healing wounds. He also denies extended periods of immobility, denies long car or airplane trip, but he does states that he walks very little, spends most of his time in his recliner, denies any barriers to walking. The patient has had no history of DVT, no history of varicose veins, no history of venous stasis ulcers, no history of  Lymphedema and no history of skin changes in lower legs. Wife states that patient's 14 year old nephew developed a DVT and what sounds like a pulmonary embolus, then died.  The patient has not used compression stockings in the past. Patient denies any history of GI bleeding, denies history of significant nose bleeds, denies any bleeding issues but does states that he bruises easily. He currently takes 81 mg daily ASA and no other antiplatelet or anticoagulant medication.   The patient does not have back or abdominal pain. The patient is not a smoker.  The patient denies claudication in legs with walking.  The patient denies history of stroke or TIA symptoms, denies history of MI.  Pt Diabetic: Yes, last A1C  was  9.?, improved from 11.1, uncontrolled but improving.  Pt smoker: former smoker, quit in 2010     Past Medical History  Diagnosis Date  . Hyperlipidemia   . AAA (abdominal aortic aneurysm)     status post repair in 2005  . Hx of colonic polyps   . CKD (chronic kidney disease)     last creatinine   . Oropharynx cancer   . Cancer 04/28/10    R base of tongue, inv squamous cell, HPV +  . Diabetes mellitus type I    . Hypertension   . COPD (chronic obstructive pulmonary disease)   . Hx of radiation therapy 06/01/10 to 07/21/10    R base of tongue  . Anemia     mild/chemo-induced    Past Surgical History  Procedure Laterality Date  . Tonsillectomy    . Colonoscopy w/ polypectomy    . Hemorrhoid surgery    . Inguinal hernia repair    . Penial implant    . Rotator cuff repair    . Appendectomy    . Abdominal aortic aneurysm repair      History   Social History  . Marital Status: Married    Spouse Name: N/A    Number of Children: 3  . Years of Education: N/A   Occupational History  . Retired    Social History Main Topics  . Smoking status: Former Smoker -- 1.00 packs/day for 50 years    Types: Cigarettes    Quit date: 01/29/2009  . Smokeless tobacco: Never Used  . Alcohol Use: No  . Drug Use: No  . Sexual Activity: Not on file   Other Topics Concern  . Not on file   Social History Narrative  . No narrative on file    Family History  Problem Relation Age of Onset  . Cancer Other   . Stroke Other   . Cancer Mother  unknown type  . Cancer Sister     lung  . Cancer Sister     brain  . Heart disease Father   . Heart attack Father   . Hypertension Father     Current Outpatient Prescriptions on File Prior to Visit  Medication Sig Dispense Refill  . aspirin 81 MG tablet Take 81 mg by mouth daily.        . Coenzyme Q10 (COQ10) 100 MG CAPS Take by mouth daily.        [name] donepezil (ARICEPT) 10 MG tablet Take 1 tablet (10 mg total) by mouth at bedtime.  90 tablet  3  . glucose blood (ONETOUCH VERIO) test strip Test twice daily, dx 250.01  200 each  3  . insulin detemir (LEVEMIR) 100 UNIT/ML injection Inject 0.3 mLs (30 Units total) into the skin at bedtime.  30 mL  3  . Multiple Vitamin (MULTIVITAMIN PO) Take by mouth daily.        [name] NOVOLOG 100 UNIT/ML injection INJECT 15 UNITS INTO THE   SKIN 3 TIMES A DAY BEFORE  MEALS  30 mL  11  . ONETOUCH DELICA LANCETS FINE MISC  Test twice daily, dx 250.01      . rosuvastatin (CRESTOR) 5 MG tablet Take 1 tablet (5 mg total) by mouth 3 (three) times a week.  90 tablet  3   No current facility-administered medications on file prior to visit.    Allergies as of 05/11/2014  . (No Known Allergies)     ROS: see HPI for pertinent positives and negatives.    Physical Examination  Filed Vitals:   05/11/14 1455  BP: 134/73  Pulse: 78  Temp: 97.6 F (36.4 C)  TempSrc: Oral  Resp: 16  Height: 5\' 11"  (1.803 m)  Weight: 170 lb (77.111 kg)  SpO2: 98%   Body mass index is 23.72 kg/(m^2).  General: A&O x 3, WD, with wife.  Pulmonary: Sym exp, fair air movt, CTAB, crackles in both bases, no rhonchi, or wheezing.  Cardiac: RRR, Nl S1, S2, no detected murmur.  Carotid Bruits  Left  Right    Negative  Negative   Aorta is palpable  Radial pulses are 2+ palpable and =  VASCULAR EXAM:  LE Pulses  LEFT  RIGHT   FEMORAL  palpable  palpable   POPLITEAL  2+ palpable  2+ palpable   POSTERIOR TIBIAL  palpable  palpable   DORSALIS PEDIS  ANTERIOR TIBIAL  2+palpable  2+palpable   Bilateral 1+ pitting ankle and feet edema, no lesions, no venous stasis changes.  Gastrointestinal: soft, NTND, -G/R, - HSM, - masses, - CVAT B.  Musculoskeletal: M/S 5/5 throughout, Extremities without ischemic changes.  Neurologic: CN 2-12 intact, Pain and light touch intact in extremities are intact except, Motor exam as listed above.   DATA: LOWER EXTREMITY VENOUS DUPLEX EVALUATION (05/11/2014)     INDICATION: Bilateral edema of the feet and ankles.    PREVIOUS INTERVENTION(S): Right distal SFA to popliteal BPG for popliteal aneurysm. Open repair of AAA in 2005.    DUPLEX EXAM:      Common Femoral Vein  Femoral Vein Popliteal Vein Posterior Tibial Vein  Peroneal Vein Great Saphenous Vein   Right Left Right Left Right Left Right Left Right Left Right Left  Spontaneous + + + + + - + + + + + +  Phasic + + + + + + + + + + + +   Compressible + + + + + - + + + + + +  Augmentation + + + + + - + + + + + +  Competent - - - - - - + + + + + +     Legend:  + = Yes, -  = No; P = Partial, D = Decreased, NV = Not Visualized, NA = Not Examined    Thrombosis - - - - - P,A - - - - - -                                                        Legend:  A = Acute, C = Chronic, O = Obstructive, P = Partially Obstructive     ADDITIONAL FINDINGS:     IMPRESSION: Evidence of deep vein thrombosis of the left popliteal vein which appears to be acute thrombus which has formed on chronic thrombus. Significant Deep vein reflux noted bilaterally.     Assessment: Dustin Vance is a 80 y.o. male patient of Dr. Scot Dock who is s/p open AAA in 2005 and right popliteal aneurysm repair in 2004. He returns today for new complaint of bilateral lower extremities edema for a month. On bilateral lower extremity venous Duplex today: Evidence of deep vein thrombosis of the left popliteal vein which appears to be acute thrombus which has formed on chronic thrombus. Significant Deep vein reflux noted bilaterally.  He does have crackles in both lung bases, but denies dyspnea, denies cough.  I spoke with Roxy Cedar, NP, in Dr. Dellis Filbert Todd's office, who would like to start patient on 20 mg Xarelto daily, prescription sent electronically to his local pharmacy, disp #21, no refills.  Patient advised to follow up in Dr. Honor Junes office in 1 week, follow up as scheduled with our office s/p open AAA in 2005 and right popliteal aneurysm repair in 2004 in March, 2016, sooner if needed.   Plan:  Start patient on 20 mg Xarelto daily, prescription sent electronically to his local pharmacy, disp #21, no refills.  Stop the aspirin while taking Xarelto. Further DVT management by Dr. Sherren Mocha, patient's PCP, and Dr. Honor Junes office.   Patient advised to follow up with Dr. Honor Junes office in 1 week. Patient was advised to call 911 should he develop shortness of  breath or bleeding problems.  Follow up as scheduled with our office s/p open AAA in 2005 and right popliteal aneurysm repair in 2004 in March, 2016, sooner if needed.    Clemon Chambers, RN, MSN, FNP-C [facility] Office: [phone]  Clinic MD: Kellie Simmering

## 2014-05-11 NOTE — Telephone Encounter (Signed)
[facility] called to report a DVT left popliteal vein. Unice Cobble to call patient/wife and start Xarelto 15mg  twice daily x 3 weeks. However, be sure he has an appt within the next week with me.

## 2014-05-12 ENCOUNTER — Telehealth: Payer: Self-pay | Admitting: Family

## 2014-05-12 ENCOUNTER — Ambulatory Visit: Payer: Medicare HMO | Admitting: Family

## 2014-05-12 ENCOUNTER — Other Ambulatory Visit: Payer: Self-pay | Admitting: *Deleted

## 2014-05-12 MED ORDER — INSULIN DETEMIR 100 UNIT/ML ~~LOC~~ SOLN
30.0000 [IU] | Freq: Every day | SUBCUTANEOUS | Status: DC
Start: 2014-05-12 — End: 2014-08-05

## 2014-05-12 MED ORDER — RIVAROXABAN 20 MG PO TABS: 20.0000 mg | ORAL_TABLET | Freq: Every day | ORAL | Status: DC

## 2014-05-12 MED ORDER — INSULIN ASPART 100 UNIT/ML ~~LOC~~ SOLN: SUBCUTANEOUS | Status: DC

## 2014-05-12 NOTE — Telephone Encounter (Signed)
Spoke with patients wife, [name]. She is aware of the cancellation and plan to see PCP, dpm

## 2014-05-12 NOTE — Telephone Encounter (Signed)
Spoke with wife and samples are available for pick up.  Appointment made.

## 2014-05-12 NOTE — Telephone Encounter (Signed)
Message copied by Gena Fray on Wed May 12, 2014 10:36 AM ------      Message from: Viann Fish      Created: Tue May 11, 2014  4:55 PM      Regarding: cancel        Zigmund Daniel, [facility] admin:            Please cancel the order for venous Duplex in a month.      Please cancel patient's appointment with Dr. Scot Dock for a month.      Dr. Kellie Simmering suggested to defer to his PCP for further evaluation and management of DVT.       I left a message on pt's home phone voicemail re this.      Scheduling please follow up with pt to confirm that he does not need to follow up with [place] in a month, that he will follow up in March, 2016, as already scheduled s/p open AAA in 2005 and right popliteal aneurysm repair in 2004.      If he needs to return sooner for leg swelling, pain in the muscles of his legs with walking, or sores in his feet or legs that have trouble healing, he may.            Thank you,      Vinnie Level ------

## 2014-05-14 ENCOUNTER — Encounter: Payer: Self-pay | Admitting: Family

## 2014-05-14 ENCOUNTER — Ambulatory Visit (INDEPENDENT_AMBULATORY_CARE_PROVIDER_SITE_OTHER): Payer: Medicare HMO | Admitting: Family

## 2014-05-14 VITALS — BP 144/68 | HR 76 | Temp 98.1°F | Ht 71.0 in | Wt 172.0 lb

## 2014-05-14 DIAGNOSIS — E1165 Type 2 diabetes mellitus with hyperglycemia: Secondary | ICD-10-CM

## 2014-05-14 DIAGNOSIS — I10 Essential (primary) hypertension: Secondary | ICD-10-CM

## 2014-05-14 DIAGNOSIS — I82432 Acute embolism and thrombosis of left popliteal vein: Secondary | ICD-10-CM

## 2014-05-14 DIAGNOSIS — IMO0001 Reserved for inherently not codable concepts without codable children: Secondary | ICD-10-CM

## 2014-05-14 DIAGNOSIS — I824Y9 Acute embolism and thrombosis of unspecified deep veins of unspecified proximal lower extremity: Secondary | ICD-10-CM

## 2014-05-14 DIAGNOSIS — IMO0002 Reserved for concepts with insufficient information to code with codable children: Secondary | ICD-10-CM

## 2014-05-14 DIAGNOSIS — E7800 Pure hypercholesterolemia, unspecified: Secondary | ICD-10-CM

## 2014-05-14 LAB — BASIC METABOLIC PANEL
BUN: 22 mg/dL (ref 6–23)
CALCIUM: 8.5 mg/dL (ref 8.4–10.5)
CO2: 29 mEq/L (ref 19–32)
CREATININE: 1.5 mg/dL (ref 0.4–1.5)
Chloride: 106 mEq/L (ref 96–112)
GFR: 49.54 mL/min — ABNORMAL LOW (ref >60.00)
Glucose, Bld: 164 mg/dL — ABNORMAL HIGH (ref 70–99)
Potassium: 4.6 mEq/L (ref 3.5–5.1)
Sodium: 139 mEq/L (ref 135–145)

## 2014-05-14 LAB — CBC WITH DIFFERENTIAL/PLATELET
BASOS ABS: 0 10*3/uL (ref 0.0–0.1)
Basophils Relative: 0.3 % (ref 0.0–3.0)
EOS ABS: 0.2 10*3/uL (ref 0.0–0.7)
Eosinophils Relative: 3.6 % (ref 0.0–5.0)
HEMATOCRIT: 35 % — AB (ref 39.0–52.0)
Hemoglobin: 11.5 g/dL — ABNORMAL LOW (ref 13.0–17.0)
LYMPHS ABS: 1.5 10*3/uL (ref 0.7–4.0)
Lymphocytes Relative: 23.3 % (ref 12.0–46.0)
MCHC: 32.8 g/dL (ref 30.0–36.0)
MCV: 93.3 fl (ref 78.0–100.0)
MONO ABS: 0.6 10*3/uL (ref 0.1–1.0)
Monocytes Relative: 8.7 % (ref 3.0–12.0)
NEUTROS ABS: 4.1 10*3/uL (ref 1.4–7.7)
Neutrophils Relative %: 64.1 % (ref 43.0–77.0)
Platelets: 281 10*3/uL (ref 150.0–400.0)
RBC: 3.76 Mil/uL — ABNORMAL LOW (ref 4.22–5.81)
RDW: 14.6 % (ref 11.5–15.5)
WBC: 6.4 10*3/uL (ref 4.0–10.5)

## 2014-05-14 MED ORDER — RIVAROXABAN (XARELTO) VTE STARTER PACK (15 & 20 MG): ORAL_TABLET | ORAL | Status: DC

## 2014-05-14 NOTE — Patient Instructions (Signed)

## 2014-05-14 NOTE — Progress Notes (Signed)
Subjective:    Patient ID: Dustin Vance, male    DOB: 12-20-1932, 80 y.o.   MRN: [id number]  HPI  80 year old white male, nonsmoker and after being diagnosed with a DVT in the left popliteal vein and the vascular and pain specialist 2 days ago. He is currently on Xarelto 15mg  twice a day. He is tolerating medication well. No renal insufficiency. Has a history of type 2 diabetes, hypertension, hyperlipidemia, and dementia.   Review of Systems  Constitutional: Negative.   HENT: Negative.   Respiratory: Negative.  Negative for shortness of breath and wheezing.   Cardiovascular: Negative.  Negative for chest pain.  Gastrointestinal: Negative.   Endocrine: Negative.   Genitourinary: Negative.   Musculoskeletal: Negative.   Skin: Negative.   Allergic/Immunologic: Negative.   Neurological: Negative.   Psychiatric/Behavioral: Negative.    Past Medical History  Diagnosis Date  . Hyperlipidemia   . AAA (abdominal aortic aneurysm)     status post repair in 2005  . Hx of colonic polyps   . CKD (chronic kidney disease)     last creatinine   . Oropharynx cancer   . Cancer 04/28/10    R base of tongue, inv squamous cell, HPV +  . Diabetes mellitus type I   . Hypertension   . COPD (chronic obstructive pulmonary disease)   . Hx of radiation therapy 06/01/10 to 07/21/10    R base of tongue  . Anemia     mild/chemo-induced    History   Social History  . Marital Status: Married    Spouse Name: N/A    Number of Children: 3  . Years of Education: N/A   Occupational History  . Retired    Social History Main Topics  . Smoking status: Former Smoker -- 1.00 packs/day for 50 years    Types: Cigarettes    Quit date: 01/29/2009  . Smokeless tobacco: Never Used  . Alcohol Use: No  . Drug Use: No  . Sexual Activity: Not on file   Other Topics Concern  . Not on file   Social History Narrative  . No narrative on file    Past Surgical History  Procedure Laterality Date  .  Tonsillectomy    . Colonoscopy w/ polypectomy    . Hemorrhoid surgery    . Inguinal hernia repair    . Penial implant    . Rotator cuff repair    . Appendectomy    . Abdominal aortic aneurysm repair      Family History  Problem Relation Age of Onset  . Cancer Other   . Stroke Other   . Cancer Mother     unknown type  . Cancer Sister     lung  . Cancer Sister     brain  . Heart disease Father   . Heart attack Father   . Hypertension Father     No Known Allergies  Current Outpatient Prescriptions on File Prior to Visit  Medication Sig Dispense Refill  . Coenzyme Q10 (COQ10) 100 MG CAPS Take by mouth daily.        [name] donepezil (ARICEPT) 10 MG tablet Take 1 tablet (10 mg total) by mouth at bedtime.  90 tablet  3  . glucose blood (ONETOUCH VERIO) test strip Test twice daily, dx 250.01  200 each  3  . insulin aspart (NOVOLOG) 100 UNIT/ML injection INJECT 15 UNITS INTO THE   SKIN 3 TIMES A DAY BEFORE  MEALS  2 vial  0  . insulin detemir (LEVEMIR) 100 UNIT/ML injection Inject 0.3 mLs (30 Units total) into the skin at bedtime.  2 vial  0  . Multiple Vitamin (MULTIVITAMIN PO) Take by mouth daily.        [name] DELICA LANCETS FINE MISC Test twice daily, dx 250.01      . rosuvastatin (CRESTOR) 5 MG tablet Take 1 tablet (5 mg total) by mouth 3 (three) times a week.  90 tablet  3   No current facility-administered medications on file prior to visit.    BP 144/68  Pulse 76  Temp(Src) 98.1 F (36.7 C) (Oral)  Ht 5\' 11"  (1.803 m)  Wt 172 lb (78.019 kg)  BMI 24.00 kg/m2chart    Objective:   Physical Exam  Constitutional: He is oriented to person, place, and time. He appears well-developed and well-nourished.  HENT:  Right Ear: External ear normal.  Left Ear: External ear normal.  Nose: Nose normal.  Mouth/Throat: Oropharynx is clear and moist.  Neck: Normal range of motion. Neck supple.  Cardiovascular: Normal rate, regular rhythm and normal heart sounds.     Pulmonary/Chest: Effort normal and breath sounds normal.  Musculoskeletal: He exhibits edema. He exhibits no tenderness.  1 + pitting edema  Neurological: He is alert and oriented to person, place, and time.  Skin: Skin is warm and dry.  Psychiatric: He has a normal mood and affect.          Assessment & Plan:  Dustin Vance was seen today for follow-up.  Diagnoses and associated orders for this visit:  Acute deep vein thrombosis (DVT) of popliteal vein of left lower extremity - CBC with Differential - Basic metabolic panel  Type 2 diabetes mellitus, uncontrolled  Unspecified essential hypertension  Pure hypercholesterolemia  Other Orders - Rivaroxaban (XARELTO STARTER PACK) 15 & 20 MG TBPK; Take as directed on package: Start with one 15mg  tablet by mouth twice a day with food. On Day 22, switch to one 20mg  tablet once a day with food.   Call the office with any questions or concerns. Recheck as scheduled and as needed

## 2014-05-14 NOTE — Progress Notes (Signed)
Pre visit review using our clinic review tool, if applicable. No additional management support is needed unless otherwise documented below in the visit note. 

## 2014-05-20 ENCOUNTER — Encounter: Payer: Self-pay | Admitting: Radiation Oncology

## 2014-05-20 NOTE — Progress Notes (Signed)
Patient's wife called to see why they are getting facility charge. I advised her to call billing and her insurance and see why they are not paying that charge. She said they have never had a charge before just co pay.

## 2014-06-03 ENCOUNTER — Encounter: Payer: Self-pay | Admitting: Family Medicine

## 2014-06-03 ENCOUNTER — Ambulatory Visit (INDEPENDENT_AMBULATORY_CARE_PROVIDER_SITE_OTHER): Payer: Medicare HMO | Admitting: Family Medicine

## 2014-06-03 VITALS — BP 140/80 | Temp 97.9°F | Wt 170.0 lb

## 2014-06-03 DIAGNOSIS — Z23 Encounter for immunization: Secondary | ICD-10-CM

## 2014-06-03 DIAGNOSIS — F17200 Nicotine dependence, unspecified, uncomplicated: Secondary | ICD-10-CM

## 2014-06-03 DIAGNOSIS — I824Y9 Acute embolism and thrombosis of unspecified deep veins of unspecified proximal lower extremity: Secondary | ICD-10-CM

## 2014-06-03 DIAGNOSIS — I82432 Acute embolism and thrombosis of left popliteal vein: Secondary | ICD-10-CM

## 2014-06-03 LAB — PROTIME-INR
INR: 1.5 ratio — ABNORMAL HIGH (ref 0.8–1.0)
PROTHROMBIN TIME: 16.4 s — AB (ref 9.6–13.1)

## 2014-06-03 MED ORDER — ATORVASTATIN CALCIUM 10 MG PO TABS: 10.0000 mg | ORAL_TABLET | Freq: Every day | ORAL | Status: AC

## 2014-06-03 MED ORDER — WARFARIN SODIUM 5 MG PO TABS: 5.0000 mg | ORAL_TABLET | Freq: Every day | ORAL | Status: DC

## 2014-06-03 NOTE — Progress Notes (Signed)
   Subjective:    Patient ID: Dustin Vance, male    DOB: 26-Sep-1933, 80 y.o.   MRN: [id number]  HPI Dustin Vance is a 80 year old male smoker with type 1 diabetes and a history of vascular surgery who comes in today for followup of a blood clot  He had swelling of both lower extremities and his wife to come back to see his vascular physician Dr. Doren Custard. He had no chest pain shortness of breath etc. Scan showed a clot which they felt was new on top of previous old clots. He's had popliteal surgery in the past. He was started on xeralto 15 mg twice a day and now on 20 mg daily.  I had a long discussion with he and his wife. Because his diabetes and his neuropathy and balance I think he be better off on Coumadin.   Review of Systems Review of systems negative    Objective:   Physical Exam  Well-developed and nourished male no acute distress vital signs stable he is afebrile      Assessment & Plan:  DVT,,,,,,,,,,,, switched to Coumadin

## 2014-06-03 NOTE — Patient Instructions (Signed)
Coumadin 5 mg........... 2 tabs at bedtime for 3 nights.......... then one tablet daily  Xeralto............ one tablet daily for 3 days then stop  Return next Tuesday for followup

## 2014-06-03 NOTE — Progress Notes (Signed)
Pre visit review using our clinic review tool, if applicable. No additional management support is needed unless otherwise documented below in the visit note. 

## 2014-06-08 ENCOUNTER — Ambulatory Visit: Payer: Medicare HMO | Admitting: Family Medicine

## 2014-06-10 ENCOUNTER — Ambulatory Visit (INDEPENDENT_AMBULATORY_CARE_PROVIDER_SITE_OTHER): Payer: Medicare HMO | Admitting: Family Medicine

## 2014-06-10 ENCOUNTER — Encounter: Payer: Self-pay | Admitting: Family Medicine

## 2014-06-10 VITALS — BP 130/80

## 2014-06-10 DIAGNOSIS — I82432 Acute embolism and thrombosis of left popliteal vein: Secondary | ICD-10-CM

## 2014-06-10 DIAGNOSIS — E109 Type 1 diabetes mellitus without complications: Secondary | ICD-10-CM

## 2014-06-10 DIAGNOSIS — I824Y9 Acute embolism and thrombosis of unspecified deep veins of unspecified proximal lower extremity: Secondary | ICD-10-CM

## 2014-06-10 DIAGNOSIS — I10 Essential (primary) hypertension: Secondary | ICD-10-CM

## 2014-06-10 LAB — POCT INR: INR: 7.9

## 2014-06-10 NOTE — Patient Instructions (Signed)
Hold the Coumadin until Sunday night.......[name] restart half a tablet a day at bedtime  Followup with me on Tuesday  Continue the 25 units of long-acting insulin at bedtime ,,,,,,,,,,,,,, insulin after each meal according to how much she eats ........... 5 ........... 10 ..........[name] or 15 ..........[name]

## 2014-06-10 NOTE — Progress Notes (Signed)
   Subjective:    Patient ID: Dustin Vance, male    DOB: 1933/05/12, 80 y.o.   MRN: [id number]  HPI Dustin Vance is a 80 year old male who comes in today accompanied by his wife who is his primary caregiver since he has early dementia,,,,,,,, for evaluation of multiple issues  He's on Coumadin 5 mg daily. INR will be done today........ INR 7.9,,,,,,,, we will hold the Coumadin and followup in 5 days  He's taking 25 units of long-acting insulin at bedtime and 15 units before each meal however some meals he doesn't eat much. Today he didn't eat much lunch took his insulin before lunch after lunch she didn't feel well. Blood sugar was 48. We discussed changing his insulin to getting it after meals     Review of Systems    negative Objective:   Physical Exam Well-developed well-nourished male no acute distress vital signs stable he is afebrile       Assessment & Plan:  Diabetes type 1 improve control,,,,,,,,,, switch insulin to after meals,,,,,,,, use a sliding scale depending on caloric intake  DVT........... INR too high....... hold Coumadin today Friday Saturday ,,,,,,,,,,,,,,, restart Sunday night a half a pill daily INR next Tuesday.[name]

## 2014-06-12 ENCOUNTER — Encounter: Payer: Self-pay | Admitting: *Deleted

## 2014-06-15 ENCOUNTER — Ambulatory Visit (INDEPENDENT_AMBULATORY_CARE_PROVIDER_SITE_OTHER): Payer: Medicare HMO | Admitting: Family Medicine

## 2014-06-15 ENCOUNTER — Encounter: Payer: Self-pay | Admitting: Family Medicine

## 2014-06-15 VITALS — BP 120/68 | HR 84 | Temp 98.1°F | Wt 172.0 lb

## 2014-06-15 DIAGNOSIS — I824Y9 Acute embolism and thrombosis of unspecified deep veins of unspecified proximal lower extremity: Secondary | ICD-10-CM

## 2014-06-15 DIAGNOSIS — I82432 Acute embolism and thrombosis of left popliteal vein: Secondary | ICD-10-CM

## 2014-06-15 LAB — POCT INR: INR: 1.4

## 2014-06-15 NOTE — Patient Instructions (Signed)
Coumadin 5 mg.......... one tablet nightly starting tonight  Followup INR on Friday

## 2014-06-15 NOTE — Progress Notes (Signed)
   Subjective:    Patient ID: Dustin Vance, male    DOB: 03/07/1933, 80 y.o.   MRN: [id number]  HPI  Dustin Vance is a 80 year old married male nonsmoker???????????? who comes in today accompanied by his wife for followup of DVT  We transitioned him to Coumadin. His INR last week was 7.9 after 5 doses of 5 mg daily. We held the Coumadin over the weekend. INR today 1.4  Review of Systems Review of systems otherwise negative    Objective:   Physical Exam  Well-developed well-nourished male no acute distress vital signs stable he is afebrile      Assessment & Plan:  DVT..........[name]

## 2014-06-16 ENCOUNTER — Ambulatory Visit: Payer: Medicare HMO | Admitting: Vascular Surgery

## 2014-06-16 ENCOUNTER — Encounter (HOSPITAL_COMMUNITY): Payer: Medicare HMO

## 2014-06-18 ENCOUNTER — Ambulatory Visit (INDEPENDENT_AMBULATORY_CARE_PROVIDER_SITE_OTHER): Payer: Medicare HMO | Admitting: *Deleted

## 2014-06-18 DIAGNOSIS — I82432 Acute embolism and thrombosis of left popliteal vein: Secondary | ICD-10-CM

## 2014-06-18 DIAGNOSIS — I824Y9 Acute embolism and thrombosis of unspecified deep veins of unspecified proximal lower extremity: Secondary | ICD-10-CM

## 2014-06-18 LAB — POCT INR: INR: 3.2

## 2014-06-22 ENCOUNTER — Ambulatory Visit (INDEPENDENT_AMBULATORY_CARE_PROVIDER_SITE_OTHER): Payer: Medicare HMO | Admitting: Family Medicine

## 2014-06-22 ENCOUNTER — Encounter: Payer: Self-pay | Admitting: Family Medicine

## 2014-06-22 VITALS — BP 140/70 | Temp 98.0°F | Wt 169.0 lb

## 2014-06-22 DIAGNOSIS — I824Y9 Acute embolism and thrombosis of unspecified deep veins of unspecified proximal lower extremity: Secondary | ICD-10-CM

## 2014-06-22 DIAGNOSIS — E109 Type 1 diabetes mellitus without complications: Secondary | ICD-10-CM

## 2014-06-22 DIAGNOSIS — I82432 Acute embolism and thrombosis of left popliteal vein: Secondary | ICD-10-CM

## 2014-06-22 LAB — POCT INR: INR: 5.1

## 2014-06-22 NOTE — Patient Instructions (Signed)
No Coumadin tonight or Wednesday.........[name] restart Thursday night 2.5 mg  2.5 mg...........[name] Monday through Friday............. 5 mg Saturday Sunday  Followup next Monday

## 2014-06-22 NOTE — Progress Notes (Signed)
   Subjective:    Patient ID: MAKANI SECKMAN, male    DOB: 05-04-1933, 80 y.o.   MRN: [id number]  HPI Mr. Gallaga is a 80 year old male who comes in today accompanied by his wife who is his primary caregiver........ he has early dementia......... for followup of DVT and diabetes  His blood sugar is better. He's not get in the 40s like use to be step in his 65s. We decreased his long-acting insulin 30 units to 25. We also decreased the sliding scale the pain and what he has to eat.  His INR on Friday was 3.2 on 5 mg daily today is 5.1. He states he's eating a salad daily   Review of Systems    review of systems otherwise negative Objective:   Physical Exam   Well-developed well-nourished in no acute distress vital signs stable he is afebrile     Assessment & Plan:  Diabetes type 1......... still having hypoglycemic episodes.......... continue the sliding scale before each meal depending on carbon take and decrease long-acting insulin down to 20 units daily  History of DVT............ hold Coumadin today and Wednesday...........[name] restart Thursday 2.5 mg Monday through Friday,,,,,,,,,, 5 mg Saturday Sunday followup next Monday.

## 2014-06-28 ENCOUNTER — Ambulatory Visit (INDEPENDENT_AMBULATORY_CARE_PROVIDER_SITE_OTHER): Payer: Medicare HMO | Admitting: Family Medicine

## 2014-06-28 ENCOUNTER — Encounter: Payer: Self-pay | Admitting: Family Medicine

## 2014-06-28 VITALS — BP 112/50 | HR 84 | Temp 97.6°F | Wt 170.3 lb

## 2014-06-28 DIAGNOSIS — I82432 Acute embolism and thrombosis of left popliteal vein: Secondary | ICD-10-CM

## 2014-06-28 DIAGNOSIS — E1065 Type 1 diabetes mellitus with hyperglycemia: Secondary | ICD-10-CM

## 2014-06-28 DIAGNOSIS — E109 Type 1 diabetes mellitus without complications: Secondary | ICD-10-CM

## 2014-06-28 DIAGNOSIS — I824Y9 Acute embolism and thrombosis of unspecified deep veins of unspecified proximal lower extremity: Secondary | ICD-10-CM

## 2014-06-28 LAB — POCT INR: INR: 1.3

## 2014-06-28 NOTE — Progress Notes (Signed)
Pre visit review using our clinic review tool, if applicable. No additional management support is needed unless otherwise documented below in the visit note. 

## 2014-06-28 NOTE — Patient Instructions (Signed)
Coumadin.......... 2.5 mg.......[name] Monday through Friday.......... 5 mg Saturday Sunday........[name] return next Monday for followup

## 2014-06-28 NOTE — Progress Notes (Signed)
   Subjective:    Patient ID: Dustin Vance, male    DOB: 04/04/1933, 80 y.o.   MRN: [id number]  HPI  Dustin Vance is a 80 year old married male nonsmoker?????????? who comes in today for followup of DVT and diabetes  He forgot to take his insulin yesterday and his blood sugar this morning was elevated.  His INR was 1.3 however his wife forgot to give him a dose her Coumadin over the weekend   Review of Systems    review of systems otherwise negative Objective:   Physical Exam Well-developed well-nourished male in no acute distress vital signs stable he is afebrile BP today 112/50       Assessment & Plan:  DVT,,,,,, outlined Coumadin treatment program followup in one week  Diabetes type 1.............. again stressed insulin as outlined,,

## 2014-07-05 ENCOUNTER — Ambulatory Visit: Payer: Medicare HMO | Admitting: Family Medicine

## 2014-07-06 ENCOUNTER — Ambulatory Visit: Payer: Medicare HMO | Admitting: Family Medicine

## 2014-07-06 LAB — HM DIABETES EYE EXAM

## 2014-07-07 ENCOUNTER — Encounter: Payer: Self-pay | Admitting: Family Medicine

## 2014-07-07 ENCOUNTER — Ambulatory Visit (INDEPENDENT_AMBULATORY_CARE_PROVIDER_SITE_OTHER): Payer: Medicare HMO | Admitting: Family Medicine

## 2014-07-07 VITALS — BP 130/78 | Temp 97.4°F | Wt 172.0 lb

## 2014-07-07 DIAGNOSIS — R413 Other amnesia: Secondary | ICD-10-CM

## 2014-07-07 DIAGNOSIS — E109 Type 1 diabetes mellitus without complications: Secondary | ICD-10-CM

## 2014-07-07 DIAGNOSIS — I724 Aneurysm of artery of lower extremity: Secondary | ICD-10-CM

## 2014-07-07 DIAGNOSIS — I824Y9 Acute embolism and thrombosis of unspecified deep veins of unspecified proximal lower extremity: Secondary | ICD-10-CM

## 2014-07-07 DIAGNOSIS — I82432 Acute embolism and thrombosis of left popliteal vein: Secondary | ICD-10-CM

## 2014-07-07 LAB — POCT INR: INR: 3.2

## 2014-07-07 NOTE — Progress Notes (Signed)
   Subjective:    Patient ID: Dustin Vance, male    DOB: [birth year], 80 y.o.   MRN: [id number]  HPI  Dustin Vance is a 80 year old male who comes in today for followup of DVT and diabetes type 1  I. been working with him over the past couple weeks. His blood sugar continues to fluctuate. He occasionally forgets to take his insulin. Now are going to a program where his wife his color monitor and give him his insulin as outlined therefore we will have to remember to do it. He is having memory problems as noted above.  His INR is 3.2 on 2.5 mg of Coumadin Monday through Friday 5 mg Saturday Sunday   Review of Systems    review of systems otherwise negative Objective:   Physical Exam  Well-developed well-nourished male no acute distress vital signs stable he is afebrile      Assessment & Plan:  Diabetes type 1 improve control because of wife's involvement in giving him his medication  DVT,,,,,,,, INR goal,,, followup in 2 weeks,,,,

## 2014-07-07 NOTE — Progress Notes (Signed)
Pre visit review using our clinic review tool, if applicable. No additional management support is needed unless otherwise documented below in the visit note. 

## 2014-07-07 NOTE — Patient Instructions (Signed)
Walk 15 minutes daily  Your wife will give year insulin  Continue your current Coumadin dose  Followup in 2 weeks

## 2014-07-12 ENCOUNTER — Encounter: Payer: Self-pay | Admitting: Family Medicine

## 2014-07-22 ENCOUNTER — Encounter: Payer: Self-pay | Admitting: Family Medicine

## 2014-07-22 ENCOUNTER — Ambulatory Visit (INDEPENDENT_AMBULATORY_CARE_PROVIDER_SITE_OTHER): Payer: Medicare HMO | Admitting: Family Medicine

## 2014-07-22 ENCOUNTER — Ambulatory Visit (INDEPENDENT_AMBULATORY_CARE_PROVIDER_SITE_OTHER): Payer: Medicare HMO | Admitting: Family

## 2014-07-22 VITALS — BP 138/80 | Temp 97.7°F | Wt 172.1 lb

## 2014-07-22 DIAGNOSIS — I82432 Acute embolism and thrombosis of left popliteal vein: Secondary | ICD-10-CM

## 2014-07-22 DIAGNOSIS — E103319 Type 1 diabetes mellitus with moderate nonproliferative diabetic retinopathy with macular edema, unspecified eye: Secondary | ICD-10-CM

## 2014-07-22 DIAGNOSIS — E10331 Type 1 diabetes mellitus with moderate nonproliferative diabetic retinopathy with macular edema: Secondary | ICD-10-CM

## 2014-07-22 LAB — POCT INR: INR: 2.8

## 2014-07-22 NOTE — Patient Instructions (Signed)
Continue 5mg  on Sat/Sun and 2.5 mg during the week (M-F). Recheck in 2 weeks.  Anticoagulation Dose Instructions as of 07/22/2014     Dorene Grebe Tue Wed Thu Fri Sat   New Dose 5 mg 2.5 mg 2.5 mg 2.5 mg 2.5 mg 2.5 mg 5 mg    Description       Continue 5mg  on Sat/Sun and 2.5 mg during the week (M-F). Recheck in 2 weeks.

## 2014-07-22 NOTE — Progress Notes (Signed)
Pre visit review using our clinic review tool, if applicable. No additional management support is needed unless otherwise documented below in the visit note. 

## 2014-07-22 NOTE — Progress Notes (Signed)
   Subjective:    Patient ID: KEENE GILKEY, male    DOB: [birth year], 80 y.o.   MRN: [id number]  HPI Mr. Martucci is a 80 year old male who comes in today accompanied by his wife......... who is his primary caregiver because he has mild dementia...Marland KitchenMarland KitchenMarland Kitchen for followup of Coumadin because it's been difficult to control his INR...... also been difficult to control his diabetes because of his dementia. We're not able to really follow a strict diet........... I think we'll try to keep him in the ballpark. Currently sugars run from 70-200   Review of Systems    review of systems otherwise negative Objective:   Physical Exam  Well-developed well-nourished male no acute distress vital signs stable he is afebrile INR today 2.8      Assessment & Plan:  DVT with normal INR followup in 2 weeks  Diabetes type 1............. continue current therapy I think we'll have to be content with blood sugars between 70 and 200

## 2014-08-05 ENCOUNTER — Encounter: Payer: Self-pay | Admitting: Family Medicine

## 2014-08-05 ENCOUNTER — Ambulatory Visit (INDEPENDENT_AMBULATORY_CARE_PROVIDER_SITE_OTHER): Payer: Medicare HMO | Admitting: Family Medicine

## 2014-08-05 DIAGNOSIS — R413 Other amnesia: Secondary | ICD-10-CM

## 2014-08-05 DIAGNOSIS — E1021 Type 1 diabetes mellitus with diabetic nephropathy: Secondary | ICD-10-CM

## 2014-08-05 DIAGNOSIS — I82432 Acute embolism and thrombosis of left popliteal vein: Secondary | ICD-10-CM

## 2014-08-05 LAB — POCT INR: INR: 4.5

## 2014-08-05 MED ORDER — INSULIN ASPART 100 UNIT/ML ~~LOC~~ SOLN: SUBCUTANEOUS | Status: DC

## 2014-08-05 MED ORDER — INSULIN DETEMIR 100 UNIT/ML ~~LOC~~ SOLN: 30.0000 [IU] | Freq: Every day | SUBCUTANEOUS | Status: DC

## 2014-08-05 NOTE — Patient Instructions (Signed)
Hold Coumadin today and Friday,,,,,,,,,,, restart Saturday night one half tab daily  Followup in 4 weeks  Continue your good blood sugar control

## 2014-08-05 NOTE — Addendum Note (Signed)
Addended by: Westley Hummer B on: 08/05/2014 05:29 PM   Modules accepted: Orders

## 2014-08-05 NOTE — Progress Notes (Signed)
   Subjective:    Patient ID: CARLEE TESFAYE, male    DOB: [birth year], 80 y.o.   MRN: [id number]  HPI Wilbert is a 80 year old male who comes in today for evaluation of DVT and diabetes type 1  We've had a lot of difficulty controlling his blood sugar. His wife is now in charge of his medication and his blood sugars are much improved. He continues not to follow a good diet however he has some early dementia and when I compress that point. She decreased his insulin to 20 units daily because his hypoglycemia. Blood sugar this morning was 91.  He stopped ED and greens about 2 weeks ago. INR now up to 4.5   Review of Systems    review of systems otherwise negative Objective:   Physical Exam  Well-developed well-nourished male in no acute distress vital signs stable he is afebrile      Assessment & Plan:  Diabetes type 1 improve control since we've gotten his wife involved with his medication. Continue the current monitoring and medication.  DVT decrease Coumadin to one half tablet daily............ was on one half tablet Monday through Friday and a full tablet Saturday and Sunday........ INR is gone up probably because he stopped eating greens.  Followup in one month

## 2014-09-02 ENCOUNTER — Ambulatory Visit: Payer: Medicare HMO | Admitting: Family Medicine

## 2014-09-06 ENCOUNTER — Other Ambulatory Visit: Payer: Self-pay | Admitting: *Deleted

## 2014-09-06 ENCOUNTER — Encounter: Payer: Self-pay | Admitting: Family Medicine

## 2014-09-06 ENCOUNTER — Ambulatory Visit (INDEPENDENT_AMBULATORY_CARE_PROVIDER_SITE_OTHER): Payer: Medicare HMO | Admitting: Family Medicine

## 2014-09-06 VITALS — BP 130/70 | Temp 98.0°F | Wt 170.0 lb

## 2014-09-06 DIAGNOSIS — I82409 Acute embolism and thrombosis of unspecified deep veins of unspecified lower extremity: Secondary | ICD-10-CM

## 2014-09-06 DIAGNOSIS — E1059 Type 1 diabetes mellitus with other circulatory complications: Secondary | ICD-10-CM

## 2014-09-06 LAB — POCT INR: INR: 2.5

## 2014-09-06 MED ORDER — INSULIN ASPART 100 UNIT/ML ~~LOC~~ SOLN: SUBCUTANEOUS | Status: DC

## 2014-09-06 NOTE — Patient Instructions (Signed)
Return the third week in January for follow-up  A1c one week ahead of time  Pro time in one month  Continue your current medications

## 2014-09-06 NOTE — Progress Notes (Signed)
   Subjective:    Patient ID: Dustin Vance, male    DOB: 1933-06-11, 80 y.o.   MRN: [id number]  HPI Dustin Vance is a 80 year old male nonsmoker accompanied by his wife for evaluation of diabetes hypertension and DVT  He is on a combination of insulin at bedtime 30 units, sliding scale before each meal, and blood sugar is in the 79-100 range.  Weight stable at 170  Blood pressure stable 130/70  INR today 2.5  Last A1c 4 months ago was elevated 8.5%. He still not walking   Review of Systems    review of systems otherwise negative Objective:   Physical Exam  Well-developed well-nourished male no acute distress vital signs stable is afebrile      Assessment & Plan:  Diabetes type 1 markedly improved control.....[name] continue current therapy follow-up in 2 months.... Labs one week prior

## 2014-09-06 NOTE — Progress Notes (Signed)
Pre visit review using our clinic review tool, if applicable. No additional management support is needed unless otherwise documented below in the visit note. 

## 2014-09-28 ENCOUNTER — Ambulatory Visit (INDEPENDENT_AMBULATORY_CARE_PROVIDER_SITE_OTHER): Payer: Medicare HMO | Admitting: Neurology

## 2014-09-28 ENCOUNTER — Encounter: Payer: Self-pay | Admitting: Neurology

## 2014-09-28 VITALS — BP 127/66 | HR 80 | Temp 97.6°F | Ht 72.0 in | Wt 167.0 lb

## 2014-09-28 DIAGNOSIS — F0390 Unspecified dementia without behavioral disturbance: Secondary | ICD-10-CM

## 2014-09-28 MED ORDER — DONEPEZIL HCL 10 MG PO TABS: 10.0000 mg | ORAL_TABLET | Freq: Every day | ORAL | Status: AC

## 2014-09-28 NOTE — Patient Instructions (Signed)
We will follow up in 4 months. We will consider a second memory medicine at the time.

## 2014-09-28 NOTE — Progress Notes (Signed)
Subjective:    Patient ID: Dustin Vance is a 80 y.o. male.  HPI     Interim history:   Dustin Vance is a very pleasant 80 year old right-handed gentleman with an underlying medical history of diabetes, oral pharyngeal carcinoma, status post radiation therapy as well as chemotherapy in August and October 2011, hyperlipidemia, hypertension and isolated syncopal episode in the past, vascular disease with s/p open AAA in 2005 and right popliteal aneurysm repair in 2004, who presents for followup consultation of his memory loss. The patient is accompanied by his wife again today. I last saw him on 03/29/2014, at which time he reported no recent illness. He felt his memory was stable and his wife endorsed the same. She did note some mood irritability. He felt hypoglycemic being room and felt better after some juice. I suggested he stay on Aricept. His MMSE was 29 at the time. In the interim, in July 2015 he developed a DVT. He was placed on Xarelto and then switched to Coumadin.  Today, his wife reports that his memory may have been a little worse. His legs are swollen. His memory seems to fluctuate with how good his sugar control is. His most recent A1c was above 9. He may be able to come off of Coumadin in January. He does not exercise regularly. He has been on Aricept 10 mg since before I met him in June 2014. It may have been over 2 years.   I saw him on 09/28/2013, at which time I felt he was fairly stable and had remained in the [facility] arena. He was tolerating Aricept generic 10 mg once daily and I continue him on that dose. He reported that his Crestor was changed to 3 times per week due to leg cramping. Crestor is now 3/week, d/t leg cramping reported. He felt that his neuropathy was stable. He reported some trouble with blood sugar control. His wife reported that he did not exercise regularly.   I first met him on 04/08/2013 at which time I encouraged him to continue with the Aricept 10 mg  strength. I did not order any new test and I felt that he was stable.    He previously followed with Dr. Morene Antu and was last seen by him on 09/18/2012, at which time Dr. Erling Cruz felt the patient was stable and doing well. His MMSE was improved and he was encouraged to exercise.    He has an approximately 5+ year history of forgetfulness and memory loss. There is no family history of dementia. There is no personal history of head trauma, or substance abuse. He has been diabetic for many years. In April 2012 his MMSE was 28, his brain MRI showed Sylvian, mesial temporal and diffuse atrophy with enlarged ventricles and mild to moderate chronic small vessel disease.   B12 level was 302 and RPR was negative. In August 2012 his MMSE was 27, clock drawing was 4, animal fluency was 12. In June 2013 his MMSE was 27, clock drawing was 3, animal fluency was 15. In December 2013 his MMSE was 30, clock drawing was 4, animal fluency was 11.    His Past Medical History Is Significant For: Past Medical History  Diagnosis Date  . Hyperlipidemia   . AAA (abdominal aortic aneurysm)     status post repair in 2005  . Hx of colonic polyps   . CKD (chronic kidney disease)     last creatinine   . Oropharynx cancer   . Cancer  04/28/10    R base of tongue, inv squamous cell, HPV +  . Diabetes mellitus type I   . Hypertension   . COPD (chronic obstructive pulmonary disease)   . Hx of radiation therapy 06/01/10 to 07/21/10    R base of tongue  . Anemia     mild/chemo-induced    His Past Surgical History Is Significant For: Past Surgical History  Procedure Laterality Date  . Tonsillectomy    . Colonoscopy w/ polypectomy    . Hemorrhoid surgery    . Inguinal hernia repair    . Penial implant    . Rotator cuff repair    . Appendectomy    . Abdominal aortic aneurysm repair      His Family History Is Significant For: Family History  Problem Relation Age of Onset  . Cancer Other   . Stroke Other   . Cancer  Mother     unknown type  . Cancer Sister     lung  . Cancer Sister     brain  . Heart disease Father   . Heart attack Father   . Hypertension Father     His Social History Is Significant For: History   Social History  . Marital Status: Married    Spouse Name: N/A    Number of Children: 3  . Years of Education: N/A   Occupational History  . Retired    Social History Main Topics  . Smoking status: Former Smoker -- 1.00 packs/day for 50 years    Types: Cigarettes    Quit date: 01/29/2009  . Smokeless tobacco: Never Used  . Alcohol Use: No  . Drug Use: No  . Sexual Activity: None   Other Topics Concern  . None   Social History Narrative    His Allergies Are:  No Known Allergies:   His Current Medications Are:  Outpatient Encounter Prescriptions as of 09/28/2014  Medication Sig  . atorvastatin (LIPITOR) 10 MG tablet Take 1 tablet (10 mg total) by mouth daily.  . Coenzyme Q10 (COQ10) 100 MG CAPS Take by mouth daily.    [name] donepezil (ARICEPT) 10 MG tablet Take 1 tablet (10 mg total) by mouth at bedtime.  [name] glucose blood (ONETOUCH VERIO) test strip Test twice daily, dx 250.01  . insulin aspart (NOVOLOG) 100 UNIT/ML injection INJECT 15 UNITS INTO THE   SKIN 3 TIMES A DAY BEFORE  MEALS  . insulin detemir (LEVEMIR) 100 UNIT/ML injection Inject 0.3 mLs (30 Units total) into the skin at bedtime.  . Multiple Vitamin (MULTIVITAMIN PO) Take by mouth daily.    [name] DELICA LANCETS FINE MISC Test twice daily, dx 250.01  . rosuvastatin (CRESTOR) 5 MG tablet Take 1 tablet (5 mg total) by mouth 3 (three) times a week.  . warfarin (COUMADIN) 5 MG tablet Take 1 tablet (5 mg total) by mouth daily. (Patient taking differently: Take 5 mg by mouth daily. 1/2 tablet daily)  :  Review of Systems:  Out of a complete 14 point review of systems, all are reviewed and negative with the exception of these symptoms as listed below:   Review of Systems  Neurological:       Back pain  (middle)  All other systems reviewed and are negative.   Objective:  Neurologic Exam  Physical Exam Physical Examination:   Filed Vitals:   09/28/14 1250  BP: 127/66  Pulse: 80  Temp: 97.6 F (36.4 C)   General Examination: The patient is a  very pleasant 80 y.o. male in no acute distress. He is calm and cooperative with the exam. He denies Auditory Hallucinations and Visual Hallucinations.   HEENT: Normocephalic, atraumatic, pupils are equal, round and reactive to light and accommodation. Funduscopic exam is normal with sharp disc margins noted. Extraocular tracking shows mild saccadic breakdown without nystagmus noted. Hearing is intact with bilateral hearing aids in place. Face is symmetric with no facial masking and normal facial sensation. There is no lip, neck or jaw tremor. Neck is not rigid with intact passive ROM. There are no carotid bruits on auscultation. Oropharynx exam reveals moderate to severe mouth dryness. No significant airway crowding is noted. Mallampati is class II. Tongue protrudes centrally and palate elevates symmetrically. He is status post chemo and radiation.    Chest: is clear to auscultation without wheezing, rhonchi or crackles noted.  Heart: sounds are regular and normal without murmurs, rubs or gallops noted.   Abdomen: is soft, non-tender and non-distended with normal bowel sounds appreciated on auscultation.  Extremities: There is 1-2+ pitting edema in both ankles.   Skin: is warm and dry with no trophic changes noted.  Musculoskeletal: exam reveals no obvious joint deformities, tenderness or joint swelling or erythema.  Neurologically:  Mental status: The patient is awake and alert, paying good  attention. He is able to provide the history. His wife provides details. He is oriented to: person, place, time/date, situation, day of week, month of year and year. His memory, attention, language and knowledge are impaired very mildly. There is no  aphasia, agnosia, apraxia or anomia. There is a no degree of bradyphrenia. Speech is mildly hypophonic with mild dysarthria noted, d/t cancer treatment, unchanged. Mood is congruent and affect is normal.   His MMSE score on 04/08/13 was: 29/30, CDT was 4/4, AFT (Animal Fluency Test) score was 10.  His MMSE score on 03/29/14: 29/30, CDT 4/4, AFT was 13.  On 09/28/2014: MMSE was 23/30, CDT 3/4, AFT was 11, GDS was 1/15.  Cranial nerves are as described above under HEENT exam. In addition, shoulder shrug is normal with equal shoulder height noted.  Motor exam: Normal bulk, and strength for age is noted. Tone is Not rigid with absence of cogwheeling. There is overall no bradykinesia. There is no drift or rebound. There is no tremor.   Romberg is negative. Reflexes are 1+ in the upper extremities and trace in the lower extremities. Fine motor skills: Finger taps, hand movements, and rapid alternating patting are not impaired bilaterally. Foot taps and foot agility are not impaired bilaterally.   Cerebellar testing shows no dysmetria or intention tremor on finger to nose testing. Heel to shin is mildly difficult for him. There is no truncal or gait ataxia.   Sensory exam is intact to light touch, pinprick, vibration, temperature sense in the upper extremities and very mildly decreased in the lower extremities for PP, temperature and vibration sense to above the ankles bilaterally.   Gait, station and balance: He stands up from the seated position with no difficulty and needs. No veering to one side is noted. No leaning to one side. Posture is mildly stooped, but age-appropriate. Stance is narrow-based. He turns in 2 steps. Balance is not impaired.   Assessment and Plan:   In summary, BRAIDON CHERMAK is a very pleasant 80 year old male with an underlying medical history of diabetes, oral pharyngeal carcinoma, status post radiation therapy as well as chemotherapy in August and October 2011, hyperlipidemia,  hypertension and isolated  syncopal episode in the past, as well as recent DVT, on coumadin, who presents for followup consultation of his memory loss. He has had a decline in his memory scores in the last 6 months. This can fluctuate a little bit according to how his general health is in his blood sugar control is. I would not like to push for second memory drug but I would like to see him back for recheck in 3-4 months. His neuropathy is stable. This is most likely diabetic in etiology. I suggested that he continue with the Aricept at the current dose. We will consider adding Namenda next time. He is advised to walk regularly.  He may be interested in participating in dementia trials. I will look into this.  As far as further diagnostic testing is concerned, I suggested the following: no change.  As far as medications are concerned, I recommended the following at this time: no change. I renewed a 90 day prescription for Aricept generic. I answered all their questions today and the patient and his wife were in agreement. I encouraged them to call with any interim questions, concerns, problems, updates and refill requests.

## 2014-09-29 ENCOUNTER — Encounter: Payer: Self-pay | Admitting: Radiation Oncology

## 2014-09-29 ENCOUNTER — Ambulatory Visit
Admission: RE | Admit: 2014-09-29 | Discharge: 2014-09-29 | Disposition: A | Discharge status: Home / Self Care | Payer: Medicare HMO | Source: Ambulatory Visit | Attending: Radiation Oncology | Admitting: Radiation Oncology

## 2014-09-29 VITALS — BP 145/73 | HR 75 | Temp 97.8°F | Resp 20 | Ht 73.0 in | Wt 168.9 lb

## 2014-09-29 DIAGNOSIS — Z923 Personal history of irradiation: Secondary | ICD-10-CM | POA: Insufficient documentation

## 2014-09-29 DIAGNOSIS — C024 Malignant neoplasm of lingual tonsil: Secondary | ICD-10-CM

## 2014-09-29 DIAGNOSIS — C109 Malignant neoplasm of oropharynx, unspecified: Secondary | ICD-10-CM | POA: Diagnosis not present

## 2014-09-29 MED ORDER — LARYNGOSCOPY SOLUTION RAD-ONC
15.0000 mL | Freq: Once | TOPICAL | Status: AC
  Administered 2014-09-29: 15 mL via TOPICAL
  Filled 2014-09-29: qty 15

## 2014-09-29 NOTE — Progress Notes (Signed)
Follow up s/p radiation ooropharynx 05/22/10-07/21/10,appetite good, difficulty swallowing, oatmeal just hangs in back of throat, stll dry mouth but better, no rinses, no nauaea, energy level poor, tripped on sidewalk coming up to Cancer center, skin abrasion on left knee,  Cleansed area with normal saline and put neosporin over area and 2x2 gause,taped in place no bleeding, patient said he was fine , no apin  Stated,thoracic area pain after taking showers lasts 1/2-1 hour, saw Dr. Nell Range 09/28/14, stays on Aricept 10mg  daily, f/u for memory loss, patient alrt,oriented x3 today, coumadin adjusted to 2.5mg  daily , blood sugars fluctuating 1:55 PM

## 2014-10-01 ENCOUNTER — Encounter: Payer: Self-pay | Admitting: Radiation Oncology

## 2014-10-01 NOTE — Progress Notes (Signed)
Radiation Oncology         [phone] ________________________________  Name: Dustin Vance MRN: [id number]  Date: 09/29/2014  DOB: [birth year]  Follow-Up Visit Note  CC: Joycelyn Man, MD  Nobie Putnam, MD  Diagnosis:   [id number]   Interval Since Last Radiation:  4 years   Narrative:  The patient returns today for routine follow-up.  The patient states he clinically has been stable. The patient notes no major problems or difficulties since he was last seen. He indicates that xerostomia is still present but has been more improved. Some ongoing fatigue. No pain with swallowing or increasing dysphagia although he still has some difficulty with food sticking in his throat.                        ALLERGIES:  has No Known Allergies.  Meds: Current Outpatient Prescriptions  Medication Sig Dispense Refill  . Coenzyme Q10 (COQ10) 100 MG CAPS Take by mouth daily.      [name] donepezil (ARICEPT) 10 MG tablet Take 1 tablet (10 mg total) by mouth at bedtime. 90 tablet 3  . glucose blood (ONETOUCH VERIO) test strip Test twice daily, dx 250.01 200 each 3  . insulin aspart (NOVOLOG) 100 UNIT/ML injection INJECT 15 UNITS INTO THE   SKIN 3 TIMES A DAY BEFORE  MEALS 20 mL 0  . insulin detemir (LEVEMIR) 100 UNIT/ML injection Inject 0.3 mLs (30 Units total) into the skin at bedtime. 10 mL 0  . Multiple Vitamin (MULTIVITAMIN PO) Take by mouth daily.      [name] DELICA LANCETS FINE MISC Test twice daily, dx 250.01    . warfarin (COUMADIN) 5 MG tablet Take 1 tablet (5 mg total) by mouth daily. (Patient taking differently: Take 5 mg by mouth daily. 1/2 tablet daily) 100 tablet 3  . atorvastatin (LIPITOR) 10 MG tablet Take 1 tablet (10 mg total) by mouth daily. 90 tablet 3  . donepezil (ARICEPT) 10 MG tablet Take 1 tablet (10 mg total) by mouth at bedtime. 90 tablet 3   No current facility-administered medications for this encounter.    Physical Findings: The patient is in no acute distress.  Patient is alert and oriented.  height is 6\' 1"  (1.854 m) and weight is 168 lb 14.4 oz (76.613 kg). His oral temperature is 97.8 F (36.6 C). His blood pressure is 145/73 and his pulse is 75. His respiration is 20 and oxygen saturation is 98%. .   General: Well-developed, in no acute distress HEENT: Normocephalic, atraumatic, the neck shows some moderate fibrosis with the skin had healed very well; oral cavity clear Cardiovascular: Regular rate and rhythm Respiratory: Clear to auscultation bilaterally GI: Soft, nontender, normal bowel sounds Extremities: No edema present  Fiberoptic exam: After the use of topical anesthetic, the flexible laryngoscope was passed through the right nare. Good visualization was obtained. No lesions or suspicious findings within the larynx, hypopharynx, oropharynx or nasopharynx.    Lab Findings: Lab Results  Component Value Date   WBC 6.4 05/14/2014   HGB 11.5* 05/14/2014   HCT 35.0* 05/14/2014   MCV 93.3 05/14/2014   PLT 281.0 05/14/2014     Radiographic Findings: No results found.  Impression:    The patient clinically is doing well. No concerning findings on exam today.   Plan:  Followup in 6 months.  I spent 15 minutes with the patient today, the majority of which was spent counseling the patient on the  diagnosis of cancer and coordinating care.   Jodelle Gross, M.D., Ph.D.

## 2014-10-04 ENCOUNTER — Telehealth: Payer: Self-pay | Admitting: Family

## 2014-10-04 ENCOUNTER — Ambulatory Visit (INDEPENDENT_AMBULATORY_CARE_PROVIDER_SITE_OTHER): Payer: Medicare HMO | Admitting: Family

## 2014-10-04 DIAGNOSIS — I82432 Acute embolism and thrombosis of left popliteal vein: Secondary | ICD-10-CM

## 2014-10-04 LAB — POCT INR: INR: 2.2

## 2014-10-04 NOTE — Telephone Encounter (Signed)
done

## 2014-10-04 NOTE — Patient Instructions (Addendum)
Continue 2.5 mg daily. Recheck in 4 weeks.   Anticoagulation Dose Instructions as of 10/04/2014      Dustin Vance Tue Wed Thu Fri Sat   New Dose 2.5 mg 2.5 mg 2.5 mg 2.5 mg 2.5 mg 2.5 mg 2.5 mg    Description        Continue 2.5 mg daily. Recheck in 4 weeks.

## 2014-10-04 NOTE — Telephone Encounter (Signed)
Needs to be put on the coumadin clinic schedule not lab only for PT/INR. Please call and schedule. Thank you!

## 2014-11-01 ENCOUNTER — Other Ambulatory Visit (INDEPENDENT_AMBULATORY_CARE_PROVIDER_SITE_OTHER): Payer: Medicare HMO

## 2014-11-01 ENCOUNTER — Ambulatory Visit (INDEPENDENT_AMBULATORY_CARE_PROVIDER_SITE_OTHER): Payer: Medicare HMO | Admitting: Family

## 2014-11-01 DIAGNOSIS — E1059 Type 1 diabetes mellitus with other circulatory complications: Secondary | ICD-10-CM

## 2014-11-01 DIAGNOSIS — I82432 Acute embolism and thrombosis of left popliteal vein: Secondary | ICD-10-CM

## 2014-11-01 LAB — BASIC METABOLIC PANEL
BUN: 23 mg/dL (ref 6–23)
CHLORIDE: 109 meq/L (ref 96–112)
CO2: 27 mEq/L (ref 19–32)
Calcium: 9 mg/dL (ref 8.4–10.5)
Creatinine, Ser: 1.31 mg/dL (ref 0.40–1.50)
GFR: 56.08 mL/min — ABNORMAL LOW (ref >60.00)
Glucose, Bld: 207 mg/dL — ABNORMAL HIGH (ref 70–99)
Potassium: 4.4 mEq/L (ref 3.5–5.1)
SODIUM: 139 meq/L (ref 135–145)

## 2014-11-01 LAB — POCT INR: INR: 2

## 2014-11-01 LAB — HEMOGLOBIN A1C: Hgb A1c MFr Bld: 8.8 % — ABNORMAL HIGH (ref 4.6–6.5)

## 2014-11-01 NOTE — Patient Instructions (Signed)
Continue 2.5 mg daily. Recheck in 6 weeks.   Anticoagulation Dose Instructions as of 11/01/2014      Dorene Grebe Tue Wed Thu Fri Sat   New Dose 2.5 mg 2.5 mg 2.5 mg 2.5 mg 2.5 mg 2.5 mg 2.5 mg    Description        Continue 2.5 mg daily. Recheck in 6 weeks.

## 2014-11-02 ENCOUNTER — Other Ambulatory Visit: Payer: Medicare HMO

## 2014-11-08 ENCOUNTER — Encounter: Payer: Self-pay | Admitting: Family Medicine

## 2014-11-08 ENCOUNTER — Ambulatory Visit (INDEPENDENT_AMBULATORY_CARE_PROVIDER_SITE_OTHER): Payer: Medicare HMO | Admitting: Family Medicine

## 2014-11-08 ENCOUNTER — Ambulatory Visit (INDEPENDENT_AMBULATORY_CARE_PROVIDER_SITE_OTHER)
Admission: RE | Admit: 2014-11-08 | Discharge: 2014-11-08 | Disposition: A | Discharge status: Home / Self Care | Payer: Medicare HMO | Source: Ambulatory Visit | Attending: Family Medicine | Admitting: Family Medicine

## 2014-11-08 VITALS — BP 110/70 | Temp 98.3°F | Wt 165.0 lb

## 2014-11-08 DIAGNOSIS — M546 Pain in thoracic spine: Secondary | ICD-10-CM | POA: Insufficient documentation

## 2014-11-08 DIAGNOSIS — E1021 Type 1 diabetes mellitus with diabetic nephropathy: Secondary | ICD-10-CM

## 2014-11-08 NOTE — Progress Notes (Signed)
   Subjective:    Patient ID: Dustin Vance, male    DOB: Jun 10, 1933, 81 y.o.   MRN: [id number]  HPI Dustin Vance is a 81 year old male who comes in today for evaluation of 3 problems  He has a history of diabetes type 1 did is been very difficult to control because he is noncompliant with his diet. He has some dementia and his wife helps him. His A1c was previously 9.3 now it's 8.8. We've tried to fine tune his diet exercise blood sugar and A1c but have not been successful. I think the best we can do is keep in the ballpark  He's had a history of thoracic back pain. We did a chest x-ray last year which showed no pulmonary lesions. He continues to pain complain of pain in his thoracic spine area.  He's due to come off the Coumadin   Review of Systems    review of systems otherwise negative Objective:   Physical Exam  Well-developed well-nourished male no acute distress vital signs stable he is afebrile examination spine shows enlargement of all the thoracic vertebrae no palpable tenderness lungs clear except for marked decreased breath sounds from chronic tobacco abuse      Assessment & Plan:  Diabetes type 1 improved,,,,,,,,, continue current therapy  Thoracic back pain,,,,,,,, x-ray thoracic spine  ,,,,, History of DVT,,,,,,,,,,,,, off Coumadin aspirin daily,

## 2014-11-08 NOTE — Progress Notes (Signed)
Pre visit review using our clinic review tool, if applicable. No additional management support is needed unless otherwise documented below in the visit note. 

## 2014-11-08 NOTE — Patient Instructions (Signed)
Stop the Coumadin  Aspirin one tablet daily  Go to the main office now for x-rays of your spine  Follow-up in 3 months  Labs nonfasting one week prior

## 2014-11-15 ENCOUNTER — Telehealth: Payer: Self-pay | Admitting: Family Medicine

## 2014-11-15 NOTE — Telephone Encounter (Signed)
See result note.  

## 2014-11-15 NOTE — Telephone Encounter (Signed)
Pt would like a call back about xray results

## 2014-12-13 ENCOUNTER — Ambulatory Visit: Payer: Medicare HMO

## 2014-12-22 ENCOUNTER — Other Ambulatory Visit (HOSPITAL_COMMUNITY): Payer: Medicare HMO

## 2014-12-22 ENCOUNTER — Encounter (HOSPITAL_COMMUNITY): Payer: Medicare HMO

## 2014-12-22 ENCOUNTER — Ambulatory Visit: Payer: Medicare HMO | Admitting: Family

## 2014-12-28 ENCOUNTER — Ambulatory Visit (INDEPENDENT_AMBULATORY_CARE_PROVIDER_SITE_OTHER): Payer: Medicare HMO | Admitting: Family Medicine

## 2014-12-28 ENCOUNTER — Encounter: Payer: Self-pay | Admitting: Family Medicine

## 2014-12-28 ENCOUNTER — Ambulatory Visit (INDEPENDENT_AMBULATORY_CARE_PROVIDER_SITE_OTHER)
Admission: RE | Admit: 2014-12-28 | Discharge: 2014-12-28 | Disposition: A | Discharge status: Home / Self Care | Payer: Medicare HMO | Source: Ambulatory Visit | Attending: Family Medicine | Admitting: Family Medicine

## 2014-12-28 ENCOUNTER — Encounter: Payer: Self-pay | Admitting: Family

## 2014-12-28 VITALS — BP 120/70 | HR 88 | Temp 97.3°F | Wt 157.0 lb

## 2014-12-28 DIAGNOSIS — R0782 Intercostal pain: Secondary | ICD-10-CM

## 2014-12-28 DIAGNOSIS — R079 Chest pain, unspecified: Secondary | ICD-10-CM | POA: Insufficient documentation

## 2014-12-28 MED ORDER — INSULIN DETEMIR 100 UNIT/ML ~~LOC~~ SOLN: 30.0000 [IU] | Freq: Every day | SUBCUTANEOUS | Status: AC

## 2014-12-28 MED ORDER — HYDROCODONE-ACETAMINOPHEN 10-325 MG PO TABS: ORAL_TABLET | ORAL | Status: AC

## 2014-12-28 MED ORDER — INSULIN ASPART 100 UNIT/ML ~~LOC~~ SOLN: SUBCUTANEOUS | Status: AC

## 2014-12-28 NOTE — Patient Instructions (Addendum)
Go directly to the main office for your chest x-ray  I'll call you the report   Pain pills Vicodin..........[name] 1/2-1 tablet twice daily for pain

## 2014-12-28 NOTE — Progress Notes (Signed)
   Subjective:    Patient ID: Dustin Vance, male    DOB: [birth year], 81 y.o.   MRN: [id number]  HPI   Dustin Vance is a 81 year old male who comes in with a two-week history of atraumatic right sided chest pain.  Set a started about 2 weeks ago. No history of trauma. It's sharp and is becomes severe and is getting worse. He's also begin to losing weight  Review of Systems  review of systems negative except for weight loss    Objective:   Physical Exam   well-developed well-nourished thin male no acute distress vital signs stable he is afebrile cardiac exam normal chest exam normal tenderness palpable right anterior chest wall eighth 1910 through [place] midclavicular line      Assessment & Plan:   anterior chest wall pain,,,,,,,,, chest x-ray concern about malignancy

## 2014-12-28 NOTE — Progress Notes (Signed)
Pre visit review using our clinic review tool, if applicable. No additional management support is needed unless otherwise documented below in the visit note. 

## 2014-12-29 ENCOUNTER — Other Ambulatory Visit: Payer: Self-pay | Admitting: Family Medicine

## 2014-12-29 ENCOUNTER — Other Ambulatory Visit (INDEPENDENT_AMBULATORY_CARE_PROVIDER_SITE_OTHER): Payer: Medicare HMO

## 2014-12-29 ENCOUNTER — Ambulatory Visit (INDEPENDENT_AMBULATORY_CARE_PROVIDER_SITE_OTHER)
Admission: RE | Admit: 2014-12-29 | Discharge: 2014-12-29 | Disposition: A | Discharge status: Home / Self Care | Payer: Medicare HMO | Source: Ambulatory Visit | Attending: Family | Admitting: Family

## 2014-12-29 ENCOUNTER — Ambulatory Visit (HOSPITAL_COMMUNITY)
Admission: RE | Admit: 2014-12-29 | Discharge: 2014-12-29 | Disposition: A | Discharge status: Home / Self Care | Payer: Medicare HMO | Source: Ambulatory Visit | Attending: Family | Admitting: Family

## 2014-12-29 ENCOUNTER — Other Ambulatory Visit: Payer: Self-pay | Admitting: Family

## 2014-12-29 ENCOUNTER — Encounter: Payer: Self-pay | Admitting: Family

## 2014-12-29 ENCOUNTER — Telehealth: Payer: Self-pay | Admitting: Family Medicine

## 2014-12-29 ENCOUNTER — Ambulatory Visit (INDEPENDENT_AMBULATORY_CARE_PROVIDER_SITE_OTHER): Payer: Medicare HMO | Admitting: Family

## 2014-12-29 VITALS — BP 135/74 | HR 72 | Resp 16 | Ht 71.0 in | Wt 157.0 lb

## 2014-12-29 DIAGNOSIS — Z9889 Other specified postprocedural states: Principal | ICD-10-CM

## 2014-12-29 DIAGNOSIS — Z48812 Encounter for surgical aftercare following surgery on the circulatory system: Secondary | ICD-10-CM

## 2014-12-29 DIAGNOSIS — I724 Aneurysm of artery of lower extremity: Secondary | ICD-10-CM

## 2014-12-29 DIAGNOSIS — Z87891 Personal history of nicotine dependence: Secondary | ICD-10-CM | POA: Diagnosis not present

## 2014-12-29 DIAGNOSIS — M79601 Pain in right arm: Secondary | ICD-10-CM | POA: Diagnosis not present

## 2014-12-29 DIAGNOSIS — I739 Peripheral vascular disease, unspecified: Secondary | ICD-10-CM

## 2014-12-29 DIAGNOSIS — Z86718 Personal history of other venous thrombosis and embolism: Secondary | ICD-10-CM

## 2014-12-29 DIAGNOSIS — R0781 Pleurodynia: Secondary | ICD-10-CM

## 2014-12-29 DIAGNOSIS — R938 Abnormal findings on diagnostic imaging of other specified body structures: Secondary | ICD-10-CM

## 2014-12-29 DIAGNOSIS — I714 Abdominal aortic aneurysm, without rupture, unspecified: Secondary | ICD-10-CM

## 2014-12-29 DIAGNOSIS — R9389 Abnormal findings on diagnostic imaging of other specified body structures: Secondary | ICD-10-CM

## 2014-12-29 DIAGNOSIS — Z8679 Personal history of other diseases of the circulatory system: Secondary | ICD-10-CM

## 2014-12-29 LAB — BASIC METABOLIC PANEL
BUN: 32 mg/dL — ABNORMAL HIGH (ref 6–23)
CO2: 28 meq/L (ref 19–32)
Calcium: 8.8 mg/dL (ref 8.4–10.5)
Chloride: 107 mEq/L (ref 96–112)
Creatinine, Ser: 1.24 mg/dL (ref 0.40–1.50)
GFR: 59.72 mL/min — AB (ref >60.00)
GLUCOSE: 214 mg/dL — AB (ref 70–99)
POTASSIUM: 4.4 meq/L (ref 3.5–5.1)
Sodium: 139 mEq/L (ref 135–145)

## 2014-12-29 NOTE — Telephone Encounter (Signed)
Spoke with wife and lab appointment and orders placed for today

## 2014-12-29 NOTE — Telephone Encounter (Signed)
Pt need an order for bun and creatine  Before his ct which is scheduled 01-04-2015 @2 :00 at [facility] Please inform the pt when to cone in for labs    [facility]  [address]rd floor [address]  Address: [address], [place], [place] [address]  [phone]

## 2014-12-29 NOTE — Patient Instructions (Signed)
Abdominal Aortic Aneurysm An aneurysm is a weakened or damaged part of an artery wall that bulges from the normal force of blood pumping through the body. An abdominal aortic aneurysm is an aneurysm that occurs in the lower part of the aorta, the main artery of the body.  The major concern with an abdominal aortic aneurysm is that it can enlarge and burst (rupture) or blood can flow between the layers of the wall of the aorta through a tear (aorticdissection). Both of these conditions can cause bleeding inside the body and can be life threatening unless diagnosed and treated promptly. CAUSES  The exact cause of an abdominal aortic aneurysm is unknown. Some contributing factors are:   A hardening of the arteries caused by the buildup of fat and other substances in the lining of a blood vessel (arteriosclerosis).  Inflammation of the walls of an artery (arteritis).   Connective tissue diseases, such as Marfan syndrome.   Abdominal trauma.   An infection, such as syphilis or staphylococcus, in the wall of the aorta (infectious aortitis) caused by bacteria. RISK FACTORS  Risk factors that contribute to an abdominal aortic aneurysm may include:  Age older than 60 years.   High blood pressure (hypertension).  Male gender.  Ethnicity (white race).  Obesity.  Family history of aneurysm (first degree relatives only).  Tobacco use. PREVENTION  The following healthy lifestyle habits may help decrease your risk of abdominal aortic aneurysm:  Quitting smoking. Smoking can raise your blood pressure and cause arteriosclerosis.  Limiting or avoiding alcohol.  Keeping your blood pressure, blood sugar level, and cholesterol levels within normal limits.  Decreasing your salt intake. In somepeople, too much salt can raise blood pressure and increase your risk of abdominal aortic aneurysm.  Eating a diet low in saturated fats and cholesterol.  Increasing your fiber intake by including  whole grains, vegetables, and fruits in your diet. Eating these foods may help lower blood pressure.  Maintaining a healthy weight.  Staying physically active and exercising regularly. SYMPTOMS  The symptoms of abdominal aortic aneurysm may vary depending on the size and rate of growth of the aneurysm.Most grow slowly and do not have any symptoms. When symptoms do occur, they may include:  Pain (abdomen, side, lower back, or groin). The pain may vary in intensity. A sudden onset of severe pain may indicate that the aneurysm has ruptured.  Feeling full after eating only small amounts of food.  Nausea or vomiting or both.  Feeling a pulsating lump in the abdomen.  Feeling faint or passing out. DIAGNOSIS  Since most unruptured abdominal aortic aneurysms have no symptoms, they are often discovered during diagnostic exams for other conditions. An aneurysm may be found during the following procedures:  Ultrasonography (A one-time screening for abdominal aortic aneurysm by ultrasonography is also recommended for all men aged 65-75 years who have ever smoked).  X-ray exams.  A computed tomography (CT).  Magnetic resonance imaging (MRI).  Angiography or arteriography. TREATMENT  Treatment of an abdominal aortic aneurysm depends on the size of your aneurysm, your age, and risk factors for rupture. Medication to control blood pressure and pain may be used to manage aneurysms smaller than 6 cm. Regular monitoring for enlargement may be recommended by your caregiver if:  The aneurysm is 3-4 cm in size (an annual ultrasonography may be recommended).  The aneurysm is 4-4.5 cm in size (an ultrasonography every 6 months may be recommended).  The aneurysm is larger than 4.5 cm in   size (your caregiver may ask that you be examined by a vascular surgeon). If your aneurysm is larger than 6 cm, surgical repair may be recommended. There are two main methods for repair of an aneurysm:   Endovascular  repair (a minimally invasive surgery). This is done most often.  Open repair. This method is used if an endovascular repair is not possible. Document Released: 07/11/2005 Document Revised: 01/26/2013 Document Reviewed: 10/31/2012 [employer] Patient Information 2015 [employer]. This information is not intended to replace advice given to you by your health care provider. Make sure you discuss any questions you have with your health care provider.  

## 2014-12-29 NOTE — Progress Notes (Signed)
[facility] [place]  Established Abdominal Aortic Aneurysm  History of Present Illness  Dustin Vance is a 81 y.o. (03/19/1933) male patient of Dr. Scot Dock who is s/p open AAA in 2005 and right popliteal aneurysm repair in 2004. Continued swelling of both feet and ankles. This is worse in the morning, not as noticeable during the day, has a tight feeling in both feet. Right ribs area pain started about 2 weeks ago, exacerbated by moving and walking, denies any known injury, wife states pt's PCP had xrays done to evaluate this recently, wife states pt's PCP's office will call them with results. He denies shortness of breath, denies cough, denies non healing wounds.  The patient has history of DVT discovered at his July 2015 visit, took Jennye Moccasin for a short while, then took coumadin until January 2016 prescribed by his PCP, no history of varicose veins, no history of venous stasis ulcers, no history of Lymphedema and no history of skin changes in lower legs. Wife states that patient's 64 year old nephew developed a DVT and what sounds like a pulmonary embolus, then died. The patient has not used compression stockings in the past. Patient denies any history of GI bleeding, denies history of significant nose bleeds, denies any bleeding issues but does states that he bruises easily. He currently takes 81 mg daily ASA and no other antiplatelet or anticoagulant medication.  He takes a statin.   Pain in both legs with walking a few feet started about a month ago.  The patient is not a smoker.   The patient denies history of stroke or TIA symptoms, denies history of MI.  Pt Diabetic: Yes, last A1C was 8.?, improved from 11.1, uncontrolled but improving.  Pt smoker: former smoker, quit in 2010    Past Medical History  Diagnosis Date  . Hyperlipidemia   . AAA (abdominal aortic aneurysm)     status post repair in 2005  . Hx of colonic polyps   . CKD (chronic kidney  disease)     last creatinine   . Oropharynx cancer   . Cancer 04/28/10    R base of tongue, inv squamous cell, HPV +  . Diabetes mellitus type I   . Hypertension   . COPD (chronic obstructive pulmonary disease)   . Hx of radiation therapy 06/01/10 to 07/21/10    R base of tongue  . Anemia     mild/chemo-induced  . DVT (deep venous thrombosis)    Past Surgical History  Procedure Laterality Date  . Tonsillectomy    . Colonoscopy w/ polypectomy    . Hemorrhoid surgery    . Inguinal hernia repair    . Penial implant    . Rotator cuff repair    . Appendectomy    . Abdominal aortic aneurysm repair     Social History History   Social History  . Marital Status: Married    Spouse Name: N/A  . Number of Children: 3  . Years of Education: N/A   Occupational History  . Retired    Social History Main Topics  . Smoking status: Former Smoker -- 1.00 packs/day for 50 years    Types: Cigarettes    Quit date: 01/29/2009  . Smokeless tobacco: Never Used  . Alcohol Use: No  . Drug Use: No  . Sexual Activity: Not on file   Other Topics Concern  . Not on file   Social History Narrative   Family History Family History  Problem Relation Age of Onset  . Cancer Other   . Stroke Other   . Cancer Mother     unknown type  . Cancer Sister     lung  . Cancer Sister     brain  . Heart disease Father     Before age 47  . Heart attack Father   . Hypertension Father     Current Outpatient Prescriptions on File Prior to Visit  Medication Sig Dispense Refill  . atorvastatin (LIPITOR) 10 MG tablet Take 1 tablet (10 mg total) by mouth daily. 90 tablet 3  . Coenzyme Q10 (COQ10) 100 MG CAPS Take by mouth daily.      [name] donepezil (ARICEPT) 10 MG tablet Take 1 tablet (10 mg total) by mouth at bedtime. 90 tablet 3  . glucose blood (ONETOUCH VERIO) test strip Test twice daily, dx 250.01 200 each 3  . HYDROcodone-acetaminophen (NORCO) 10-325 MG per tablet 1/2-1 tablet twice daily when  necessary for pain 60 tablet 0  . insulin aspart (NOVOLOG) 100 UNIT/ML injection INJECT 15 UNITS INTO THE   SKIN 3 TIMES A DAY BEFORE  MEALS 2 vial 0  . insulin detemir (LEVEMIR) 100 UNIT/ML injection Inject 0.3 mLs (30 Units total) into the skin at bedtime. 3 vial 2  . Multiple Vitamin (MULTIVITAMIN PO) Take by mouth daily.      [name] DELICA LANCETS FINE MISC Test twice daily, dx 250.01    . donepezil (ARICEPT) 10 MG tablet Take 1 tablet (10 mg total) by mouth at bedtime. (Patient not taking: Reported on 12/29/2014) 90 tablet 3  . warfarin (COUMADIN) 5 MG tablet Take 1 tablet (5 mg total) by mouth daily. (Patient not taking: Reported on 12/29/2014) 100 tablet 3   No current facility-administered medications on file prior to visit.   No Known Allergies  ROS: See HPI for pertinent positives and negatives.  Physical Examination  Filed Vitals:   12/29/14 1048  BP: 135/74  Pulse: 72  Resp: 16  Height: 5\' 11"  (1.803 m)  Weight: 157 lb (71.215 kg)  SpO2: 95%   Body mass index is 21.91 kg/(m^2).  General: A&O x 3, WD, with wife.  Pulmonary: Sym exp, fair air movt, CTAB, crackles in all left posterior fields, no rhonchi, or wheezing.  Cardiac: RRR, Nl S1, S2, no detected murmur.   Carotid Bruits  Left  Right    Negative  Negative   Aorta is palpable  Radial pulses are 2+ palpable and =   VASCULAR EXAM:  LE Pulses  LEFT  RIGHT   FEMORAL  palpable  palpable   POPLITEAL  not palpable  not palpable   POSTERIOR TIBIAL  palpable  palpable   DORSALIS PEDIS  ANTERIOR TIBIAL  2+palpable  2+palpable   Bilateral 1+ pitting ankle and feet edema, no lesions, no venous stasis changes.  Gastrointestinal: soft, NTND, -G/R, - HSM, - palpable masses, - CVAT B.  Musculoskeletal: M/S 5/5 throughout, Extremities without ischemic changes.  Neurologic:  A&0x3, CN 2-12 intact, Pain and light touch intact in extremities are intact except, Motor exam as listed above.          Non-Invasive Vascular Imaging  AAA Duplex (12/29/2014) ABDOMINAL AORTA DUPLEX EVALUATION - POST ENDOVASCULAR REPAIR    INDICATION: Abdominal aortic aneurysm    PREVIOUS INTERVENTION(S): Open AAA repair 2005    DUPLEX EXAM:      DIAMETER AP (cm) DIAMETER TRANSVERSE (cm) VELOCITIES (cm/sec)  Aorta 2.61 2.79 45  Right Common  Iliac 1.44 1.81 125  Left Common Iliac 1.40 1.71 81    Comparison Study       Date DIAMETER AP (cm) DIAMETER TRANSVERSE (cm)  04/23/2005 CT [facility]) NA NA     ADDITIONAL FINDINGS:     IMPRESSION: Patent abdominal aorta with history of open AAA repair, no hemodynamically significant changes present.    Compared to the previous exam:  No previous study to compare.   ABI (Date: 12/29/2014)  R: 1.19 (12/16/13, 1.16), DP: triphasic, PT: triphasic, TBI: 0.97  L: 1.27 (1.27), DP: triphasic, PT: triphasic, TBI: 1.10    Medical Decision Making  The patient is a 82 y.o. male who is s/p open AAA in 2005 and right popliteal aneurysm repair in 2004, hx of DVT July 2015 treated with Xarelto then coumadin for a few months. Right ribs area pain started about 2 weeks ago, exacerbated by moving and walking, denies any known injury, wife states pt's PCP had xrays done to evaluate this recently, wife states pt's PCP's office will call them with results.  Today's AAA Duplex reveals a patent abdominal aorta with history of open AAA repair, no hemodynamically significant changes present.  ABI's today are normal with all triphasic waveforms, his current bilateral leg pain is not related to lack of arterial perfusion in his legs. On AAA Duplex there is no evidence of aneurysms nor stenosis, his right ribs area pain is not related to AAA.  Face to face time with patient was 25 minutes. Over 50% of this time was spent on counseling and coordination of care.    Based on this patient's exam and diagnostic studies, the patient will follow up in 1 year  with the following  studies: ABI's.   I emphasized the importance of maximal medical management including strict control of blood pressure, blood glucose, and lipid levels, antiplatelet agents, obtaining regular exercise, and continued cessation of smoking.   The patient was given information about AAA including signs, symptoms, treatment, and how to minimize the risk of enlargement and rupture of aneurysms.    The patient was advised to call 911 should the patient experience sudden onset abdominal or back pain.   Thank you for allowing [place] to participate in this patient's care.  Clemon Chambers, RN, MSN, FNP-C [facility] [place] Office: [phone]  Clinic Physician: Scot Dock  12/29/2014, 11:08 AM

## 2014-12-30 ENCOUNTER — Other Ambulatory Visit: Payer: Self-pay | Admitting: *Deleted

## 2014-12-30 ENCOUNTER — Other Ambulatory Visit: Payer: Self-pay | Admitting: Family Medicine

## 2014-12-30 ENCOUNTER — Ambulatory Visit (INDEPENDENT_AMBULATORY_CARE_PROVIDER_SITE_OTHER)
Admission: RE | Admit: 2014-12-30 | Discharge: 2014-12-30 | Disposition: A | Discharge status: Home / Self Care | Payer: Medicare HMO | Source: Ambulatory Visit | Attending: Family Medicine | Admitting: Family Medicine

## 2014-12-30 DIAGNOSIS — R9389 Abnormal findings on diagnostic imaging of other specified body structures: Secondary | ICD-10-CM

## 2014-12-30 DIAGNOSIS — C3491 Malignant neoplasm of unspecified part of right bronchus or lung: Secondary | ICD-10-CM

## 2014-12-30 DIAGNOSIS — R634 Abnormal weight loss: Secondary | ICD-10-CM

## 2014-12-30 DIAGNOSIS — I739 Peripheral vascular disease, unspecified: Secondary | ICD-10-CM

## 2014-12-30 DIAGNOSIS — R938 Abnormal findings on diagnostic imaging of other specified body structures: Secondary | ICD-10-CM

## 2014-12-30 MED ORDER — IOHEXOL 300 MG/ML  SOLN
80.0000 mL | Freq: Once | INTRAMUSCULAR | Status: AC | PRN
  Administered 2014-12-30: 80 mL via INTRAVENOUS

## 2015-01-03 ENCOUNTER — Telehealth: Payer: Self-pay | Admitting: Family Medicine

## 2015-01-03 NOTE — Telephone Encounter (Signed)
Wife states pt is not doing well.pt in pain all the time. Has a hard time getting around.  a tumor was found on lung. Has appt w/ cancer center on 3/29 but  Would like to know if there is anyway pt can be seen sooner. pls advise.  Wife would like a cb.

## 2015-01-04 ENCOUNTER — Inpatient Hospital Stay: Admission: RE | Admit: 2015-01-04 | Payer: Medicare HMO | Source: Ambulatory Visit

## 2015-01-05 ENCOUNTER — Telehealth: Payer: Self-pay | Admitting: Internal Medicine

## 2015-01-05 NOTE — Telephone Encounter (Signed)
sent staff msg to Blima Singer. to contact pt with sooner appt if available.

## 2015-01-11 ENCOUNTER — Encounter: Payer: Self-pay | Admitting: Internal Medicine

## 2015-01-11 ENCOUNTER — Other Ambulatory Visit: Payer: Self-pay | Admitting: Internal Medicine

## 2015-01-11 ENCOUNTER — Ambulatory Visit: Payer: Medicare HMO

## 2015-01-11 ENCOUNTER — Ambulatory Visit (HOSPITAL_BASED_OUTPATIENT_CLINIC_OR_DEPARTMENT_OTHER): Payer: Medicare HMO | Admitting: Internal Medicine

## 2015-01-11 ENCOUNTER — Telehealth: Payer: Self-pay | Admitting: Internal Medicine

## 2015-01-11 ENCOUNTER — Other Ambulatory Visit (HOSPITAL_BASED_OUTPATIENT_CLINIC_OR_DEPARTMENT_OTHER): Payer: Medicare HMO

## 2015-01-11 VITALS — BP 137/70 | HR 85 | Temp 98.1°F | Resp 18 | Ht 71.0 in | Wt 151.4 lb

## 2015-01-11 DIAGNOSIS — C787 Secondary malignant neoplasm of liver and intrahepatic bile duct: Secondary | ICD-10-CM

## 2015-01-11 DIAGNOSIS — R590 Localized enlarged lymph nodes: Secondary | ICD-10-CM | POA: Diagnosis not present

## 2015-01-11 DIAGNOSIS — C3490 Malignant neoplasm of unspecified part of unspecified bronchus or lung: Secondary | ICD-10-CM

## 2015-01-11 DIAGNOSIS — Z8579 Personal history of other malignant neoplasms of lymphoid, hematopoietic and related tissues: Secondary | ICD-10-CM

## 2015-01-11 DIAGNOSIS — R918 Other nonspecific abnormal finding of lung field: Secondary | ICD-10-CM | POA: Diagnosis not present

## 2015-01-11 LAB — CBC WITH DIFFERENTIAL/PLATELET
BASO%: 0.6 % (ref 0.0–2.0)
BASOS ABS: 0.1 10*3/uL (ref 0.0–0.1)
EOS ABS: 0.2 10*3/uL (ref 0.0–0.5)
EOS%: 1.9 % (ref 0.0–7.0)
HCT: 39.1 % (ref 38.4–49.9)
HEMOGLOBIN: 12.5 g/dL — AB (ref 13.0–17.1)
LYMPH%: 16.7 % (ref 14.0–49.0)
MCH: 30.1 pg (ref 27.2–33.4)
MCHC: 32 g/dL (ref 32.0–36.0)
MCV: 94.1 fL (ref 79.3–98.0)
MONO#: 0.5 10*3/uL (ref 0.1–0.9)
MONO%: 6.1 % (ref 0.0–14.0)
NEUT#: 6.1 10*3/uL (ref 1.5–6.5)
NEUT%: 74.7 % (ref 39.0–75.0)
Platelets: 275 10*3/uL (ref 140–400)
RBC: 4.16 10*6/uL — ABNORMAL LOW (ref 4.20–5.82)
RDW: 15.6 % — ABNORMAL HIGH (ref 11.0–14.6)
WBC: 8.1 10*3/uL (ref 4.0–10.3)
lymph#: 1.4 10*3/uL (ref 0.9–3.3)

## 2015-01-11 LAB — COMPREHENSIVE METABOLIC PANEL (CC13)
ALT: 113 U/L — AB (ref 0–55)
AST: 99 U/L — ABNORMAL HIGH (ref 5–34)
Albumin: 2.7 g/dL — ABNORMAL LOW (ref 3.5–5.0)
Alkaline Phosphatase: 223 U/L — ABNORMAL HIGH (ref 40–150)
Anion Gap: 8 mEq/L (ref 3–11)
BUN: 26 mg/dL (ref 7.0–26.0)
CO2: 24 mEq/L (ref 22–29)
Calcium: 9.2 mg/dL (ref 8.4–10.4)
Chloride: 110 mEq/L — ABNORMAL HIGH (ref 98–109)
Creatinine: 1.2 mg/dL (ref 0.7–1.3)
EGFR: 56 mL/min/{1.73_m2} — ABNORMAL LOW (ref >90)
Glucose: 190 mg/dl — ABNORMAL HIGH (ref 70–140)
Potassium: 4.3 mEq/L (ref 3.5–5.1)
SODIUM: 142 meq/L (ref 136–145)
TOTAL PROTEIN: 7.3 g/dL (ref 6.4–8.3)
Total Bilirubin: 0.41 mg/dL (ref 0.20–1.20)

## 2015-01-11 NOTE — Progress Notes (Signed)
[facility] [phone]   Fax:([phone]) [phone]  CONSULT NOTE  REFERRING PHYSICIAN: Dr. Stevie Kern  REASON FOR CONSULTATION:  81 years old white male with questionable lung cancer  HPI Dustin Vance is a 81 y.o. 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 Lamonte Sakai and Dr. Lisbeth Renshaw. The patient was followed by observation. Recently he was seen by his primary care physician Dr. Sherren Mocha complaining of abdominal pain and shortness breath with exertion. Chest x-ray performed on 12/28/2014 showed chronic bilateral pulmonary fibrotic changes slightly more conspicious over the lower lobes. CT scan of the chest was performed on 12/30/2014 and it showed enlarged right hilar lymph node measures 2 cm. Sub- carinal lymph node is enlarged measuring 1.3 cm. Central left hilar mass measures measures 2 cm. There were also numerous lesions are identified throughout both lobes of the liver. Index lesion within the right hepatic lobe measures 3.4 cm. Lateral segment of left lobe of liver lesion measures 1.9 cm. Dr. Marlene Lard kindly referred the patient to me today for further evaluation and recommendation regarding his condition. When seen today the patient is feeling fine except for the abdominal pain and shortness breath with exertion. He denied having any significant chest pain, cough or hemoptysis. He lost around 20 pounds over the last 6 months. He denied having any significant nausea or vomiting, no fever or chills. The patient denied having any headache or visual changes. Family history significant for mother with esophageal cancer,  father died from heart disease and the patient has 2 sisters with lung cancer. He is married and has 3 daughters. He was accompanied today by his wife Dustin Vance and his daughter, Dustin Vance. He used to work as a Administrator. He has a history of smoking around 2 packs per day for 50 years and quit 6 years ago. He has no history of alcohol or drug abuse. HPI  Past Medical History  Diagnosis Date  . Hyperlipidemia   . AAA (abdominal aortic aneurysm)     status post repair in 2005  . Hx of colonic polyps   . CKD (chronic kidney disease)     last creatinine   . Oropharynx cancer   . Cancer 04/28/10    R base of tongue, inv squamous cell, HPV +  . Diabetes mellitus type I   . Hypertension   . COPD (chronic obstructive pulmonary disease)   . Hx of radiation therapy 06/01/10 to 07/21/10    R base of tongue  . Anemia     mild/chemo-induced  . DVT (deep venous thrombosis)     Past Surgical History  Procedure Laterality Date  . Tonsillectomy    . Colonoscopy w/ polypectomy    . Hemorrhoid surgery    . Inguinal hernia repair    . Penial implant    . Rotator cuff repair    . Appendectomy    . Abdominal aortic aneurysm repair      Family History  Problem Relation Age of Onset  . Cancer Other   . Stroke Other   . Cancer Mother     unknown type  . Cancer Sister  lung  . Cancer Sister     brain  . Heart disease Father     Before age 27  . Heart attack Father   . Hypertension Father     Social History History  Substance Use Topics  . Smoking status: Former Smoker -- 1.00 packs/day for 50 years    Types: Cigarettes    Quit date: 01/29/2009  . Smokeless tobacco: Never Used  . Alcohol Use: No    No Known Allergies  Current Outpatient Prescriptions  Medication Sig Dispense Refill  . acetaminophen (TYLENOL) 325 MG tablet Take 650 mg by mouth every 6 (six) hours as needed.    [name] atorvastatin (LIPITOR) 10 MG tablet Take 1 tablet (10 mg total) by mouth daily. 90 tablet 3    . Coenzyme Q10 (COQ10) 100 MG CAPS Take by mouth daily.      [name] donepezil (ARICEPT) 10 MG tablet Take 1 tablet (10 mg total) by mouth at bedtime. 90 tablet 3  . glucose blood (ONETOUCH VERIO) test strip Test twice daily, dx 250.01 200 each 3  . HYDROcodone-acetaminophen (NORCO) 10-325 MG per tablet 1/2-1 tablet twice daily when necessary for pain 60 tablet 0  . insulin aspart (NOVOLOG) 100 UNIT/ML injection INJECT 15 UNITS INTO THE   SKIN 3 TIMES A DAY BEFORE  MEALS 2 vial 0  . insulin detemir (LEVEMIR) 100 UNIT/ML injection Inject 0.3 mLs (30 Units total) into the skin at bedtime. 3 vial 2  . Multiple Vitamin (MULTIVITAMIN PO) Take by mouth daily.      [name] DELICA LANCETS FINE MISC Test twice daily, dx 250.01     No current facility-administered medications for this visit.    Review of Systems  Constitutional: positive for anorexia, fatigue and weight loss Eyes: negative Ears, nose, mouth, throat, and face: negative Respiratory: positive for dyspnea on exertion Cardiovascular: negative Gastrointestinal: positive for abdominal pain Genitourinary:negative Integument/breast: negative Hematologic/lymphatic: negative Musculoskeletal:negative Neurological: negative Behavioral/Psych: negative Endocrine: negative Allergic/Immunologic: negative  Physical Exam  HAL:PFXTK, healthy, no distress and malnourished SKIN: skin color, texture, turgor are normal HEAD: Normocephalic, No masses, lesions, tenderness or abnormalities EYES: normal, PERRLA, Conjunctiva are pink and non-injected EARS: External ears normal, Canals clear OROPHARYNX:no exudate, no erythema and lips, buccal mucosa, and tongue normal  NECK: supple, no adenopathy, no JVD LYMPH:  no palpable lymphadenopathy, no hepatosplenomegaly LUNGS: clear to auscultation , and palpation HEART: regular rate & rhythm, no murmurs and no gallops ABDOMEN:abdomen soft, non-tender, normal bowel sounds and no masses or  organomegaly BACK: Back symmetric, no curvature., No CVA tenderness EXTREMITIES:no joint deformities, effusion, or inflammation, no edema, no skin discoloration, no clubbing  NEURO: alert & oriented x 3 with fluent speech, no focal motor/sensory deficits  PERFORMANCE STATUS: ECOG 1  LABORATORY DATA: Lab Results  Component Value Date   WBC 8.1 01/11/2015   HGB 12.5* 01/11/2015   HCT 39.1 01/11/2015   MCV 94.1 01/11/2015   PLT 275 01/11/2015      Chemistry      Component Value Date/Time   NA 142 01/11/2015 1326   NA 139 12/29/2014 1533   NA 141 09/25/2011 0947   K 4.3 01/11/2015 1326   K 4.4 12/29/2014 1533   K 4.9* 09/25/2011 0947   CL 107 12/29/2014 1533   CL 108* 03/30/2013 1037   CL 106 09/25/2011 0947   CO2 24 01/11/2015 1326   CO2 28 12/29/2014 1533   CO2 29 09/25/2011 0947   BUN 26.0 01/11/2015 1326  BUN 32* 12/29/2014 1533   BUN 19 09/25/2011 0947   CREATININE 1.2 01/11/2015 1326   CREATININE 1.24 12/29/2014 1533   CREATININE 1.5* 09/25/2011 0947      Component Value Date/Time   CALCIUM 9.2 01/11/2015 1326   CALCIUM 8.8 12/29/2014 1533   CALCIUM 8.3 09/25/2011 0947   ALKPHOS 223* 01/11/2015 1326   ALKPHOS 78 07/10/2013 0932   ALKPHOS 82 09/25/2011 0947   AST 99* 01/11/2015 1326   AST 13 07/10/2013 0932   AST 7* 09/25/2011 0947   ALT 113* 01/11/2015 1326   ALT 17 07/10/2013 0932   ALT 14 09/25/2011 0947   BILITOT 0.41 01/11/2015 1326   BILITOT 0.9 07/10/2013 0932   BILITOT 0.40 09/25/2011 0947       RADIOGRAPHIC STUDIES: Dg Chest 2 View  12/28/2014   CLINICAL DATA:  Two week history of right anterior chest pain over the eighth through tenth ribs, unexplained weight loss, no history of trauma.  EXAM: CHEST  2 VIEW  COMPARISON:  Thoracic spine series of November 08, 2014 and PA and lateral chest x-ray of September 02, 2013  FINDINGS: The lungs are well-expanded. The interstitial markings are diffusely increased and have become more conspicuous in the  retrocardiac region on the lateral film. There is no alveolar infiltrate. There is stable apical pleural thickening bilaterally. There is no pleural effusion. The cardiac silhouette is top-normal in size but stable. The pulmonary vascularity is not engorged. The observed portions of the bony thorax are unremarkable. Specific attention to the right ribs reveals no definite acute abnormality. The right clavicle is intact.  IMPRESSION: Chronic bilateral pulmonary fibrotic changes slightly more conspicuous over the lower lobes since the previous study which may reflect superimposed pneumonia. Follow-up radiographs following any antibiotic therapy would be useful.  If the patient's symptoms persist and remain unexplained, further evaluation with chest CT scanning would be useful.  The observed portions of the bony thorax exhibit no acute abnormalities.   Electronically Signed   By: David  Martinique   On: 12/28/2014 16:52   Ct Chest W Contrast  12/30/2014   CLINICAL DATA:  Followup abnormal chest x-ray.  12/28/2014  EXAM: CT CHEST WITH CONTRAST  TECHNIQUE: Multidetector CT imaging of the chest was performed during intravenous contrast administration.  CONTRAST:  60mL OMNIPAQUE IOHEXOL 300 MG/ML  SOLN  COMPARISON:  PET-CT 10/18/2010  FINDINGS: Mediastinum: Mild cardiac enlargement. There is calcified atherosclerotic disease involving the thoracic aorta as well as the LAD and left circumflex coronary artery. Enlarged right hilar lymph node measures 2 cm. Sub- carinal lymph node is enlarged measuring 1.3 cm. Central left hilar mass measures measures 2 cm, image 26/series 2.  Lungs/Pleura: No pleural effusion. There is advanced, lower lobe predominant interstitial reticulation, subpleural honeycombing and traction bronchiectasis compatible with pulmonary fibrosis. Subpleural nodule within the left lower lobe measures 1 cm, image 27/series 3.  Upper Abdomen: Numerous lesions are identified throughout both lobes of the liver.  Index lesion within the right hepatic lobe measures 3.4 cm, image 50/ series 3. Lateral segment of left lobe of liver lesion measures 1.9 cm, image 49/series 2. Small stones are identified within the gallbladder. No biliary dilatation. The visualized portions of the liver are normal. Calcified granuloma identified within the spleen. The adrenal glands are unremarkable.  Musculoskeletal: No aggressive lytic or sclerotic bone lesions.  IMPRESSION: 1. Central left hilar mass is identified and worrisome for bronchogenic carcinoma. Consider further evaluation with PET-CT and tissue sampling. The left central hilar  mass should be easily amendable to bronchoscopic biopsy. 2. Evidence of extensive metastasis throughout the liver. 3. Chronic interstitial lung disease compatible of pulmonary fibrosis. 4. Atherosclerotic disease including LAD coronary artery calcification.   Electronically Signed   By: Kerby Moors M.D.   On: 12/30/2014 15:39    ASSESSMENT: This is a very pleasant 81 years old white male with history of squamous cell carcinoma of the right tonsil diagnosed in July 2011 is status post concurrent chemoradiation completed October 2011 under the care of Dr. Lamonte Sakai and Dr. Lisbeth Renshaw. The patient presented with abnormal scan with significant lymphadenopathy in the chest as well as numerous liver metastases highly suspicious for stage IV lung cancer or metastatic disease from his previous head and neck cancer.   PLAN: I had a lengthy discussion with the patient and his family today about his current disease status and further investigation to confirm diagnosis and potential treatment options. I recommended for the patient to complete the staging workup by ordering a PET scan as well as MRI of the brain to rule out other metastasis. I will also refer the patient to interventional radiology for consideration of ultrasound guided core biopsy of one of the liver lesions for tissue diagnosis. I will arrange for the  patient to come back for follow-up visit in around 10 days for reevaluation and discussion of his imaging studies and biopsy results as well as treatment options. For pain management the patient will continue on Hycodan as prescribed by Dr. Sherren Mocha. He was advised to call immediately if he has any concerning symptoms in the interval. I gave the patient and his family the time to ask questions and answered them completely to their satisfaction.  The patient voices understanding of current disease status and treatment options and is in agreement with the current care plan.  All questions were answered. The patient knows to call the clinic with any problems, questions or concerns. We can certainly see the patient much sooner if necessary.  Thank you so much for allowing me to participate in the care of LADD CEN. I will continue to follow up the patient with you and assist in his care.  I spent 40 minutes counseling the patient face to face. The total time spent in the appointment was 60 minutes.  Disclaimer: This note was dictated with voice recognition software. Similar sounding words can inadvertently be transcribed and may not be corrected upon review.   Jazzmon Prindle K. January 11, 2015, 5:20 PM

## 2015-01-11 NOTE — Telephone Encounter (Signed)
Pt confirmed labs/ov per 03/29 POF, gave pt AVS and Calendar.... KJ

## 2015-01-11 NOTE — Progress Notes (Signed)
Checked in new pt with no financial concerns at this time.  Pt has my card for any billing questions or concerns. °

## 2015-01-12 ENCOUNTER — Other Ambulatory Visit: Payer: Self-pay | Admitting: Radiology

## 2015-01-13 ENCOUNTER — Ambulatory Visit (HOSPITAL_COMMUNITY)
Admission: RE | Admit: 2015-01-13 | Discharge: 2015-01-13 | Disposition: A | Discharge status: Home / Self Care | Payer: Medicare HMO | Source: Ambulatory Visit | Attending: Internal Medicine | Admitting: Internal Medicine

## 2015-01-13 ENCOUNTER — Telehealth: Payer: Self-pay | Admitting: *Deleted

## 2015-01-13 ENCOUNTER — Other Ambulatory Visit: Payer: Self-pay | Admitting: Internal Medicine

## 2015-01-13 ENCOUNTER — Encounter (HOSPITAL_COMMUNITY): Payer: Self-pay

## 2015-01-13 DIAGNOSIS — K7689 Other specified diseases of liver: Secondary | ICD-10-CM | POA: Insufficient documentation

## 2015-01-13 DIAGNOSIS — C3490 Malignant neoplasm of unspecified part of unspecified bronchus or lung: Secondary | ICD-10-CM | POA: Diagnosis not present

## 2015-01-13 DIAGNOSIS — K769 Liver disease, unspecified: Secondary | ICD-10-CM | POA: Insufficient documentation

## 2015-01-13 LAB — CBC
HCT: 39.3 % (ref 39.0–52.0)
Hemoglobin: 12.8 g/dL — ABNORMAL LOW (ref 13.0–17.0)
MCH: 30.6 pg (ref 26.0–34.0)
MCHC: 32.6 g/dL (ref 30.0–36.0)
MCV: 94 fL (ref 78.0–100.0)
Platelets: 293 K/uL (ref 150–400)
RBC: 4.18 MIL/uL — ABNORMAL LOW (ref 4.22–5.81)
RDW: 14.7 % (ref 11.5–15.5)
WBC: 9.3 K/uL (ref 4.0–10.5)

## 2015-01-13 LAB — APTT: aPTT: 28 seconds (ref 24–37)

## 2015-01-13 LAB — PROTIME-INR
INR: 1.1 (ref 0.00–1.49)
Prothrombin Time: 14.3 s (ref 11.6–15.2)

## 2015-01-13 MED ORDER — SODIUM CHLORIDE 0.9 % IV SOLN: Freq: Once | INTRAVENOUS | Status: DC

## 2015-01-13 MED ORDER — MIDAZOLAM HCL 2 MG/2ML IJ SOLN
INTRAMUSCULAR | Status: AC
  Filled 2015-01-13: qty 2

## 2015-01-13 MED ORDER — MIDAZOLAM HCL 2 MG/2ML IJ SOLN
INTRAMUSCULAR | Status: AC | PRN
  Administered 2015-01-13: 1 mg via INTRAVENOUS

## 2015-01-13 MED ORDER — LIDOCAINE HCL (PF) 1 % IJ SOLN
INTRAMUSCULAR | Status: AC
  Filled 2015-01-13: qty 10

## 2015-01-13 MED ORDER — GELATIN ABSORBABLE 12-7 MM EX MISC
CUTANEOUS | Status: AC
  Filled 2015-01-13: qty 1

## 2015-01-13 MED ORDER — FENTANYL CITRATE 0.05 MG/ML IJ SOLN
INTRAMUSCULAR | Status: AC | PRN
  Administered 2015-01-13: 50 ug via INTRAVENOUS

## 2015-01-13 MED ORDER — FENTANYL CITRATE 0.05 MG/ML IJ SOLN
INTRAMUSCULAR | Status: AC
  Filled 2015-01-13: qty 2

## 2015-01-13 MED ORDER — HYDROCODONE-ACETAMINOPHEN 5-325 MG PO TABS: 1.0000 | ORAL_TABLET | ORAL | Status: DC | PRN

## 2015-01-13 NOTE — Procedures (Signed)
US guided core biopsies of right hepatic lobe lesion.  5 cores performed.  Minimal bleeding.  No immediate complication.

## 2015-01-13 NOTE — Discharge Instructions (Signed)
Liver Biopsy, Care After °These instructions give you information on caring for yourself after your procedure. Your doctor may also give you more specific instructions. Call your doctor if you have any problems or questions after your procedure. °HOME CARE °· Rest at home for 1-2 days or as told by your doctor. °· Have someone stay with you for at least 24 hours. °· Do not do these things in the first 24 hours: °¨ Drive. °¨ Use machinery. °¨ Take care of other people. °¨ Sign legal documents. °¨ Take a bath or shower. °· There are many different ways to close and cover a cut (incision). For example, a cut can be closed with stitches, skin glue, or adhesive strips. Follow your doctor's instructions on: °¨ Taking care of your cut. °¨ Changing and removing your bandage (dressing). °¨ Removing whatever was used to close your cut. °· Do not drink alcohol in the first week. °· Do not lift more than 5 pounds or play contact sports for the first 2 weeks. °· Take medicines only as told by your doctor. For 1 week, do not take medicine that has aspirin in it or medicines like ibuprofen. °· Get your test results. °GET HELP IF: °· A cut bleeds and leaves more than just a small spot of blood. °· A cut is red, puffs up (swells), or hurts more than before. °· Fluid or something else comes from a cut. °· A cut smells bad. °· You have a fever or chills. °GET HELP RIGHT AWAY IF: °· You have swelling, bloating, or pain in your belly (abdomen). °· You get dizzy or faint. °· You have a rash. °· You feel sick to your stomach (nauseous) or throw up (vomit). °· You have trouble breathing, feel short of breath, or feel faint. °· Your chest hurts. °· You have problems talking or seeing. °· You have trouble balancing or moving your arms or legs. °Document Released: 07/10/2008 Document Revised: 02/15/2014 Document Reviewed: 11/27/2013 °ExitCare® Patient Information ©2015 [employer]. This information is not intended to replace advice given to  you by your health care provider. Make sure you discuss any questions you have with your health care provider. ° °

## 2015-01-13 NOTE — H&P (Signed)
Chief Complaint: Abdominal pain Liver lesions  Referring Physician(s): Mohamed,Mohamed  History of Present Illness: Dustin Vance is a 81 y.o. male    Pt with Hx Head/neck Ca 2011 Developed abd pain and shortness of breath weeks ago Wt loss 12/30/2014 CT reveals several liver lesions; chest LAN Dr Julien Nordmann requests liver lesion bx Quit smoking 4-5 yrs ago    Past Medical History  Diagnosis Date  . Hyperlipidemia   . AAA (abdominal aortic aneurysm)     status post repair in 2005  . Hx of colonic polyps   . CKD (chronic kidney disease)     last creatinine   . Oropharynx cancer   . Cancer 04/28/10    R base of tongue, inv squamous cell, HPV +  . Diabetes mellitus type I   . Hypertension   . COPD (chronic obstructive pulmonary disease)   . Hx of radiation therapy 06/01/10 to 07/21/10    R base of tongue  . Anemia     mild/chemo-induced  . DVT (deep venous thrombosis)     Past Surgical History  Procedure Laterality Date  . Tonsillectomy    . Colonoscopy w/ polypectomy    . Hemorrhoid surgery    . Inguinal hernia repair    . Penial implant    . Rotator cuff repair    . Appendectomy    . Abdominal aortic aneurysm repair      Allergies: Review of patient's allergies indicates no known allergies.  Medications: Prior to Admission medications   Medication Sig Start Date End Date Taking? Authorizing Provider  acetaminophen (TYLENOL) 325 MG tablet Take 650 mg by mouth every 6 (six) hours as needed.   Yes Historical Provider, MD  aspirin EC 81 MG tablet Take 81 mg by mouth daily.   Yes Historical Provider, MD  atorvastatin (LIPITOR) 10 MG tablet Take 1 tablet (10 mg total) by mouth daily. Patient taking differently: Take 10 mg by mouth every Monday, Wednesday, and Friday.  06/03/14  Yes Dorena Cookey, MD  Coenzyme Q10 (COQ10) 100 MG CAPS Take by mouth daily.     Yes Historical Provider, MD  donepezil (ARICEPT) 10 MG tablet Take 1 tablet (10 mg total) by mouth at  bedtime. 09/28/14  Yes Star Age, MD  glucose blood (ONETOUCH VERIO) test strip Test twice daily, dx 250.01 01/18/14  Yes Dorena Cookey, MD  HYDROcodone-acetaminophen [facility]) 10-325 MG per tablet 1/2-1 tablet twice daily when necessary for pain 12/28/14  Yes Dorena Cookey, MD  insulin aspart (NOVOLOG) 100 UNIT/ML injection INJECT 15 UNITS INTO THE   SKIN 3 TIMES A DAY BEFORE  MEALS 12/28/14  Yes Dorena Cookey, MD  insulin detemir (LEVEMIR) 100 UNIT/ML injection Inject 0.3 mLs (30 Units total) into the skin at bedtime. 12/28/14  Yes Dorena Cookey, MD  Multiple Vitamin (MULTIVITAMIN PO) Take by mouth daily.     Yes Historical Provider, MD  [facility] DELICA LANCETS FINE MISC Test twice daily, dx 250.01   Yes Historical Provider, MD  TURMERIC PO Take 800 mg by mouth at bedtime.   Yes Historical Provider, MD     Family History  Problem Relation Age of Onset  . Cancer Other   . Stroke Other   . Cancer Mother     unknown type  . Cancer Sister     lung  . Cancer Sister     brain  . Heart disease Father     Before age 44  .  Heart attack Father   . Hypertension Father     History   Social History  . Marital Status: Married    Spouse Name: N/A  . Number of Children: 3  . Years of Education: N/A   Occupational History  . Retired    Social History Main Topics  . Smoking status: Former Smoker -- 1.00 packs/day for 50 years    Types: Cigarettes    Quit date: 01/29/2009  . Smokeless tobacco: Never Used  . Alcohol Use: No  . Drug Use: No  . Sexual Activity: Not on file   Other Topics Concern  . None   Social History Narrative      Review of Systems: A 12 point ROS discussed and pertinent positives are indicated in the HPI above.  All other systems are negative.  Review of Systems  Constitutional: Positive for appetite change, fatigue and unexpected weight change. Negative for fever and activity change.  Respiratory: Positive for shortness of breath.   Cardiovascular:  Negative for chest pain.  Gastrointestinal: Positive for abdominal pain.  Neurological: Positive for weakness.  Psychiatric/Behavioral: Negative for behavioral problems and confusion.    Vital Signs: BP 123/66 mmHg  Pulse 93  Temp(Src) 97.5 F (36.4 C) (Oral)  Resp 16  Ht 5\' 11"  (1.803 m)  Wt 68.04 kg (150 lb)  BMI 20.93 kg/m2  SpO2 97%  Physical Exam  Constitutional: He is oriented to person, place, and time.  thin  Cardiovascular: Normal rate.   No murmur heard. Pulmonary/Chest: Effort normal and breath sounds normal. He has no wheezes.  Abdominal: Soft. Bowel sounds are normal. There is tenderness.  Musculoskeletal: Normal range of motion.  Neurological: He is alert and oriented to person, place, and time.  Skin: Skin is warm and dry.  Psychiatric: He has a normal mood and affect. His behavior is normal. Judgment normal.  Nursing note and vitals reviewed.   Mallampati Score:  MD Evaluation Airway: WNL Heart: WNL Abdomen: WNL Chest/ Lungs: WNL ASA  Classification: 3 Mallampati/Airway Score: One  Imaging: Dg Chest 2 View  12/28/2014   CLINICAL DATA:  Two week history of right anterior chest pain over the eighth through tenth ribs, unexplained weight loss, no history of trauma.  EXAM: CHEST  2 VIEW  COMPARISON:  Thoracic spine series of November 08, 2014 and PA and lateral chest x-ray of September 02, 2013  FINDINGS: The lungs are well-expanded. The interstitial markings are diffusely increased and have become more conspicuous in the retrocardiac region on the lateral film. There is no alveolar infiltrate. There is stable apical pleural thickening bilaterally. There is no pleural effusion. The cardiac silhouette is top-normal in size but stable. The pulmonary vascularity is not engorged. The observed portions of the bony thorax are unremarkable. Specific attention to the right ribs reveals no definite acute abnormality. The right clavicle is intact.  IMPRESSION: Chronic  bilateral pulmonary fibrotic changes slightly more conspicuous over the lower lobes since the previous study which may reflect superimposed pneumonia. Follow-up radiographs following any antibiotic therapy would be useful.  If the patient's symptoms persist and remain unexplained, further evaluation with chest CT scanning would be useful.  The observed portions of the bony thorax exhibit no acute abnormalities.   Electronically Signed   By: David  Martinique   On: 12/28/2014 16:52   Ct Chest W Contrast  12/30/2014   CLINICAL DATA:  Followup abnormal chest x-ray.  12/28/2014  EXAM: CT CHEST WITH CONTRAST  TECHNIQUE: Multidetector CT imaging  of the chest was performed during intravenous contrast administration.  CONTRAST:  32mL OMNIPAQUE IOHEXOL 300 MG/ML  SOLN  COMPARISON:  PET-CT 10/18/2010  FINDINGS: Mediastinum: Mild cardiac enlargement. There is calcified atherosclerotic disease involving the thoracic aorta as well as the LAD and left circumflex coronary artery. Enlarged right hilar lymph node measures 2 cm. Sub- carinal lymph node is enlarged measuring 1.3 cm. Central left hilar mass measures measures 2 cm, image 26/series 2.  Lungs/Pleura: No pleural effusion. There is advanced, lower lobe predominant interstitial reticulation, subpleural honeycombing and traction bronchiectasis compatible with pulmonary fibrosis. Subpleural nodule within the left lower lobe measures 1 cm, image 27/series 3.  Upper Abdomen: Numerous lesions are identified throughout both lobes of the liver. Index lesion within the right hepatic lobe measures 3.4 cm, image 50/ series 3. Lateral segment of left lobe of liver lesion measures 1.9 cm, image 49/series 2. Small stones are identified within the gallbladder. No biliary dilatation. The visualized portions of the liver are normal. Calcified granuloma identified within the spleen. The adrenal glands are unremarkable.  Musculoskeletal: No aggressive lytic or sclerotic bone lesions.   IMPRESSION: 1. Central left hilar mass is identified and worrisome for bronchogenic carcinoma. Consider further evaluation with PET-CT and tissue sampling. The left central hilar mass should be easily amendable to bronchoscopic biopsy. 2. Evidence of extensive metastasis throughout the liver. 3. Chronic interstitial lung disease compatible of pulmonary fibrosis. 4. Atherosclerotic disease including LAD coronary artery calcification.   Electronically Signed   By: Kerby Moors M.D.   On: 12/30/2014 15:39    Labs:  CBC:  Recent Labs  05/14/14 1145 01/11/15 1326 01/13/15 1331  WBC 6.4 8.1 9.3  HGB 11.5* 12.5* 12.8*  HCT 35.0* 39.1 39.3  PLT 281.0 275 293    COAGS:  Recent Labs  08/05/14 1203 09/06/14 1220 10/04/14 11/01/14  INR 4.5 2.5 2.2 2.0    BMP:  Recent Labs  04/12/14 0936 05/14/14 1145 11/01/14 1425 12/29/14 1533 01/11/15 1326  NA 140 139 139 139 142  K 4.2 4.6 4.4 4.4 4.3  CL 105 106 109 107  --   CO2 27 29 27 28 24   GLUCOSE 99 164* 207* 214* 190*  BUN 21 22 23  32* 26.0  CALCIUM 8.8 8.5 9.0 8.8 9.2  CREATININE 1.5 1.5 1.31 1.24 1.2    LIVER FUNCTION TESTS:  Recent Labs  01/11/15 1326  BILITOT 0.41  AST 99*  ALT 113*  ALKPHOS 223*  PROT 7.3  ALBUMIN 2.7*    TUMOR MARKERS: No results for input(s): AFPTM, CEA, CA199, CHROMGRNA in the last 8760 hours.  Assessment and Plan:  abd pain Liver lesions on CT Hx H/N Ca Now scheduled for liver lesion bx Risks and Benefits discussed with the patient including, but not limited to bleeding, infection, damage to adjacent structures or low yield requiring additional tests. All of the patient's questions were answered, patient is agreeable to proceed. Consent signed and in chart.   Thank you for this interesting consult.  I greatly enjoyed meeting Dustin Vance and look forward to participating in their care.  Signed: Kenderick Kobler A 01/13/2015, 1:46 PM   I spent a total of  20 Minutes   in face  to face in clinical consultation, greater than 50% of which was counseling/coordinating care for liver lesion bx

## 2015-01-13 NOTE — Sedation Documentation (Addendum)
Bandaid to R flank

## 2015-01-13 NOTE — Telephone Encounter (Signed)
Mrs. Donahoe called to say her husband is having a biopsy today at 12:30 pm.  They received a call from radiology saying the soonest the PET and MRI could be scheduled is January 25, 2015.  She said that Dr. Julien Nordmann asked her to let him know if the PET and MRI could not be done prior to his appointment with Dr. Julien Nordmann on January 21, 2015.  She is calling to let him know.

## 2015-01-13 NOTE — Telephone Encounter (Signed)
Per Julien Nordmann I sent a message to [facility] regarding moving up MRI and PET.

## 2015-01-14 ENCOUNTER — Telehealth: Payer: Self-pay | Admitting: *Deleted

## 2015-01-14 NOTE — Telephone Encounter (Signed)
Patient's wife [name] called concerning office visit on 4/8 and pet scan and MRI not scheduled until 4/12. Spoke with Diane with Dr. Julien Nordmann and process in place to get scan appts moved up. Called patient's wife and informed her of current status and to keep appts. Patient's wife verbalized understanding.

## 2015-01-17 NOTE — Telephone Encounter (Signed)
Message from spouse requesting update with PET and MRI appointments.  Need tests before 01-21-2015.  Return number [phone].

## 2015-01-18 ENCOUNTER — Telehealth: Payer: Self-pay | Admitting: *Deleted

## 2015-01-18 NOTE — Telephone Encounter (Signed)
-----   Message from Curt Bears, MD sent at 01/18/2015  1:24 PM EDT ----- Keep the appointment on 4/8 even if the scan is scheduled for next week. He needs treatment soon. ----- Message -----    From: Lab in [facility]    Sent: 01/18/2015  10:06 AM      To: Curt Bears, MD

## 2015-01-18 NOTE — Telephone Encounter (Signed)
Radiology called and are able to get patient's mri brain on 01/20/15 arrive at 7:45.  I called and spoke to wife, she is aware of appt time and place.

## 2015-01-18 NOTE — Telephone Encounter (Signed)
Per MD :pt to keep 4/8 appt with MD prior to Scan 4/12 Called pt, spoke with pt's wife and instructed to keep 4/8 appt. Wife verbalized understanding. No further concerns.

## 2015-01-18 NOTE — Telephone Encounter (Signed)
PT,'S PET SCAN AND MRI ARE SCHEDULED FOR 01/25/15. NOTIFIED DR.MOHAMED. VERBAL ORDER AND READ BACK TO DR.MOHAMED- CHANGE PT.'S APPOINTMENT TO SEE ME TO AFTER PT.'S PET AND MRI. NOTIFIED SHAMEEKA DANIELS IN SCHEDULING. INFORMED PT.'S WIFE OF THE ABOVE AND TO EXPECT A CALL FROM SCHEDULING WITH A NEW APPOINTMENT TO SEE DR.MOHAMED. SHE VOICES UNDERSTANDING.

## 2015-01-19 ENCOUNTER — Encounter: Payer: Self-pay | Admitting: Skilled Nursing Facility1

## 2015-01-19 NOTE — Progress Notes (Signed)
Subjective:     Patient ID: Dustin Vance, male   DOB: 10/08/1933, 81 y.o.   MRN: [id number]  HPI   Review of Systems     Objective:   Physical Exam To help the pt identify some dietary strategies to gain some weight back that he had lost.    Assessment:     Pt was identified as being malnourished. Dietitian contacted the pt via telephone [phone]). Pts wife answered the phone and stated he is asleep. Pts wife states he has lost a tremendous amount of weight and cannot eat well due to his throat cancer treatment. Dietitian strongly advised he call Annamaria Boots  RD, [place], LDN on Monday when she is in the office.    Plan:     No plan st this time.

## 2015-01-20 ENCOUNTER — Other Ambulatory Visit (HOSPITAL_COMMUNITY): Payer: Medicare HMO

## 2015-01-20 ENCOUNTER — Other Ambulatory Visit: Payer: Self-pay | Admitting: Internal Medicine

## 2015-01-20 ENCOUNTER — Ambulatory Visit (HOSPITAL_COMMUNITY)
Admission: RE | Admit: 2015-01-20 | Discharge: 2015-01-20 | Disposition: A | Discharge status: Home / Self Care | Payer: Medicare HMO | Source: Ambulatory Visit | Attending: Internal Medicine | Admitting: Internal Medicine

## 2015-01-20 DIAGNOSIS — C3400 Malignant neoplasm of unspecified main bronchus: Secondary | ICD-10-CM

## 2015-01-20 DIAGNOSIS — C3490 Malignant neoplasm of unspecified part of unspecified bronchus or lung: Secondary | ICD-10-CM

## 2015-01-20 DIAGNOSIS — C099 Malignant neoplasm of tonsil, unspecified: Secondary | ICD-10-CM | POA: Insufficient documentation

## 2015-01-20 MED ORDER — GADOBENATE DIMEGLUMINE 529 MG/ML IV SOLN
15.0000 mL | Freq: Once | INTRAVENOUS | Status: AC | PRN
  Administered 2015-01-20: 15 mL via INTRAVENOUS

## 2015-01-21 ENCOUNTER — Encounter: Payer: Self-pay | Admitting: *Deleted

## 2015-01-21 ENCOUNTER — Telehealth: Payer: Self-pay | Admitting: Internal Medicine

## 2015-01-21 ENCOUNTER — Other Ambulatory Visit (HOSPITAL_BASED_OUTPATIENT_CLINIC_OR_DEPARTMENT_OTHER): Payer: Medicare HMO

## 2015-01-21 ENCOUNTER — Telehealth: Payer: Self-pay | Admitting: *Deleted

## 2015-01-21 ENCOUNTER — Ambulatory Visit (HOSPITAL_BASED_OUTPATIENT_CLINIC_OR_DEPARTMENT_OTHER): Payer: Medicare HMO | Admitting: Internal Medicine

## 2015-01-21 ENCOUNTER — Encounter: Payer: Self-pay | Admitting: Internal Medicine

## 2015-01-21 VITALS — BP 123/63 | HR 97 | Temp 97.9°F | Resp 18 | Ht 71.0 in | Wt 146.2 lb

## 2015-01-21 DIAGNOSIS — C787 Secondary malignant neoplasm of liver and intrahepatic bile duct: Secondary | ICD-10-CM | POA: Diagnosis not present

## 2015-01-21 DIAGNOSIS — R5383 Other fatigue: Secondary | ICD-10-CM

## 2015-01-21 DIAGNOSIS — E46 Unspecified protein-calorie malnutrition: Secondary | ICD-10-CM

## 2015-01-21 DIAGNOSIS — F329 Major depressive disorder, single episode, unspecified: Secondary | ICD-10-CM

## 2015-01-21 DIAGNOSIS — Z85818 Personal history of malignant neoplasm of other sites of lip, oral cavity, and pharynx: Secondary | ICD-10-CM

## 2015-01-21 DIAGNOSIS — C3490 Malignant neoplasm of unspecified part of unspecified bronchus or lung: Secondary | ICD-10-CM

## 2015-01-21 DIAGNOSIS — R531 Weakness: Secondary | ICD-10-CM

## 2015-01-21 LAB — CBC WITH DIFFERENTIAL/PLATELET
BASO%: 0.5 % (ref 0.0–2.0)
Basophils Absolute: 0 10*3/uL (ref 0.0–0.1)
EOS ABS: 0.1 10*3/uL (ref 0.0–0.5)
EOS%: 0.7 % (ref 0.0–7.0)
HCT: 39.8 % (ref 38.4–49.9)
HGB: 12.9 g/dL — ABNORMAL LOW (ref 13.0–17.1)
LYMPH#: 0.7 10*3/uL — AB (ref 0.9–3.3)
LYMPH%: 7.9 % — ABNORMAL LOW (ref 14.0–49.0)
MCH: 30.2 pg (ref 27.2–33.4)
MCHC: 32.4 g/dL (ref 32.0–36.0)
MCV: 93.2 fL (ref 79.3–98.0)
MONO#: 0.3 10*3/uL (ref 0.1–0.9)
MONO%: 3.4 % (ref 0.0–14.0)
NEUT%: 87.5 % — AB (ref 39.0–75.0)
NEUTROS ABS: 7.9 10*3/uL — AB (ref 1.5–6.5)
Platelets: 256 10*3/uL (ref 140–400)
RBC: 4.27 10*6/uL (ref 4.20–5.82)
RDW: 15.1 % — AB (ref 11.0–14.6)
WBC: 9.1 10*3/uL (ref 4.0–10.3)

## 2015-01-21 LAB — COMPREHENSIVE METABOLIC PANEL (CC13)
ALT: 126 U/L — AB (ref 0–55)
AST: 97 U/L — ABNORMAL HIGH (ref 5–34)
Albumin: 2.8 g/dL — ABNORMAL LOW (ref 3.5–5.0)
Alkaline Phosphatase: 332 U/L — ABNORMAL HIGH (ref 40–150)
Anion Gap: 10 mEq/L (ref 3–11)
BUN: 29.5 mg/dL — ABNORMAL HIGH (ref 7.0–26.0)
CHLORIDE: 107 meq/L (ref 98–109)
CO2: 25 meq/L (ref 22–29)
Calcium: 9.5 mg/dL (ref 8.4–10.4)
Creatinine: 1.3 mg/dL (ref 0.7–1.3)
EGFR: 50 mL/min/{1.73_m2} — AB (ref >90)
Glucose: 110 mg/dl (ref 70–140)
Potassium: 4.5 mEq/L (ref 3.5–5.1)
SODIUM: 142 meq/L (ref 136–145)
TOTAL PROTEIN: 7.6 g/dL (ref 6.4–8.3)
Total Bilirubin: 0.58 mg/dL (ref 0.20–1.20)

## 2015-01-21 NOTE — [facility] Oncology Navigator Note (Unsigned)
Patient's daughter came to see me after patient visit.  She states her and her family feel patient needs anti-depressant.  I will update Dr. Julien Nordmann.

## 2015-01-21 NOTE — Telephone Encounter (Signed)
Per staff message and POF I have scheduled appts. Advised scheduler of appts. JMW  

## 2015-01-21 NOTE — [facility] Oncology Navigator Note (Unsigned)
Spoke with patient today at [facility].  He has history of head and neck cancer and received concurrent chemoradiation therapy.  He is now here due to a different cancer.  Dr. Julien Nordmann explained upcoming treatment plan.  No barriers addressed at this time.

## 2015-01-21 NOTE — Telephone Encounter (Signed)
Gave avs & calender for April/May. Sent message to schedule treatment.

## 2015-01-21 NOTE — Telephone Encounter (Signed)
Confirm appointments for 04/13-04/15

## 2015-01-21 NOTE — Telephone Encounter (Signed)
Per staff message and POF I have scheduled appts. Advised scheduler of appts and to move labs. JMW  

## 2015-01-21 NOTE — Progress Notes (Signed)
[place] [phone]   [phone]  OFFICE PROGRESS NOTE  Joycelyn Man, MD [place] [address]  DIAGNOSIS:  1) extensive stage (T1a, N2, M1b) small cell lung cancer diagnosed in March 2016. 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  PRIOR THERAPY: Course of concurrent chemoradiation with weekly carboplatin and paclitaxel in October 2011 under the care of Dr. Lamonte Sakai and Dr. Lisbeth Renshaw.   CURRENT THERAPY: Systemic chemotherapy with carboplatin for AUC of 5 on day 1 and etoposide 100 MG/M2 on days 1, 2 and 3 with Neulasta support on day 4. First dose 01/26/2015.  INTERVAL HISTORY: Dustin Vance 81 y.o. male returns to the clinic today for follow-up visit accompanied by his wife and daughter. The patient is still complaining of increasing fatigue and weakness. He denied having any significant fever or chills. He denied having any significant chest pain but has shortness breath with exertion with mild cough and no hemoptysis. He continues to have right upper quadrant abdominal pain and tenderness. The patient had several studies for evaluation of his condition including ultrasound-guided core biopsy of one of the metastatic liver lesion in addition to MRI of the brain that was negative for brain metastasis. His PET scan is is scheduled for 01/25/2015. He is here today for evaluation and discussion of his treatment options.  MEDICAL HISTORY: Past Medical History  Diagnosis Date  . Hyperlipidemia   . AAA (abdominal aortic aneurysm)     status post repair in 2005  . Hx of colonic polyps   . CKD (chronic kidney disease)     last creatinine   . Oropharynx cancer   . Cancer 04/28/10    R base of tongue, inv squamous cell, HPV +  . Diabetes mellitus type I   . Hypertension   . COPD (chronic obstructive pulmonary disease)   . Hx of radiation  therapy 06/01/10 to 07/21/10    R base of tongue  . Anemia     mild/chemo-induced  . DVT (deep venous thrombosis)     ALLERGIES:  has No Known Allergies.  MEDICATIONS:  Current Outpatient Prescriptions  Medication Sig Dispense Refill  . aspirin EC 81 MG tablet Take 81 mg by mouth daily.    [name] atorvastatin (LIPITOR) 10 MG tablet Take 1 tablet (10 mg total) by mouth daily. (Patient taking differently: Take 10 mg by mouth every Monday, Wednesday, and Friday. ) 90 tablet 3  . Coenzyme Q10 (COQ10) 100 MG CAPS Take by mouth daily.      [name] donepezil (ARICEPT) 10 MG tablet Take 1 tablet (10 mg total) by mouth at bedtime. 90 tablet 3  . glucose blood (ONETOUCH VERIO) test strip Test twice daily, dx 250.01 200 each 3  . insulin aspart (NOVOLOG) 100 UNIT/ML injection INJECT 15 UNITS INTO THE   SKIN 3 TIMES A DAY BEFORE  MEALS 2 vial 0  . insulin detemir (LEVEMIR) 100 UNIT/ML injection Inject 0.3 mLs (30 Units total) into the skin at bedtime. 3 vial 2  . Multiple Vitamin (MULTIVITAMIN PO) Take by mouth daily.      [name] DELICA LANCETS FINE MISC Test twice daily, dx 250.01    . TURMERIC PO Take 800 mg by mouth at bedtime.    [name] acetaminophen (TYLENOL) 325 MG tablet Take 650 mg by mouth every 6 (six) hours as needed.    [name]  HYDROcodone-acetaminophen (NORCO) 10-325 MG per tablet 1/2-1 tablet twice daily when necessary for pain (Patient not taking: Reported on 01/21/2015) 60 tablet 0   No current facility-administered medications for this visit.    SURGICAL HISTORY:  Past Surgical History  Procedure Laterality Date  . Tonsillectomy    . Colonoscopy w/ polypectomy    . Hemorrhoid surgery    . Inguinal hernia repair    . Penial implant    . Rotator cuff repair    . Appendectomy    . Abdominal aortic aneurysm repair      REVIEW OF SYSTEMS:  Constitutional: positive for anorexia, fatigue and weight loss Eyes: negative Ears, nose, mouth, throat, and face: negative Respiratory: positive for dyspnea  on exertion Cardiovascular: negative Gastrointestinal: negative Genitourinary:negative Integument/breast: negative Hematologic/lymphatic: negative Musculoskeletal:positive for muscle weakness Neurological: negative Behavioral/Psych: negative Endocrine: negative Allergic/Immunologic: negative   PHYSICAL EXAMINATION: General appearance: alert, cooperative, fatigued and no distress Head: Normocephalic, without obvious abnormality, atraumatic Neck: no adenopathy, no JVD, supple, symmetrical, trachea midline and thyroid not enlarged, symmetric, no tenderness/mass/nodules Lymph nodes: Cervical, supraclavicular, and axillary nodes normal. Resp: clear to auscultation bilaterally Back: symmetric, no curvature. ROM normal. No CVA tenderness. Cardio: regular rate and rhythm, S1, S2 normal, no murmur, click, rub or gallop GI: soft, non-tender; bowel sounds normal; no masses,  no organomegaly Extremities: extremities normal, atraumatic, no cyanosis or edema Neurologic: Alert and oriented X 3, normal strength and tone. Normal symmetric reflexes. Normal coordination and gait  ECOG PERFORMANCE STATUS: 1 - Symptomatic but completely ambulatory  Blood pressure 123/63, pulse 97, temperature 97.9 F (36.6 C), temperature source Oral, resp. rate 18, height 5\' 11"  (1.803 m), weight 146 lb 3.2 oz (66.316 kg), SpO2 98 %.  LABORATORY DATA: Lab Results  Component Value Date   WBC 9.1 01/21/2015   HGB 12.9* 01/21/2015   HCT 39.8 01/21/2015   MCV 93.2 01/21/2015   PLT 256 01/21/2015      Chemistry      Component Value Date/Time   NA 142 01/11/2015 1326   NA 139 12/29/2014 1533   NA 141 09/25/2011 0947   K 4.3 01/11/2015 1326   K 4.4 12/29/2014 1533   K 4.9* 09/25/2011 0947   CL 107 12/29/2014 1533   CL 108* 03/30/2013 1037   CL 106 09/25/2011 0947   CO2 24 01/11/2015 1326   CO2 28 12/29/2014 1533   CO2 29 09/25/2011 0947   BUN 26.0 01/11/2015 1326   BUN 32* 12/29/2014 1533   BUN 19  09/25/2011 0947   CREATININE 1.2 01/11/2015 1326   CREATININE 1.24 12/29/2014 1533   CREATININE 1.5* 09/25/2011 0947      Component Value Date/Time   CALCIUM 9.2 01/11/2015 1326   CALCIUM 8.8 12/29/2014 1533   CALCIUM 8.3 09/25/2011 0947   ALKPHOS 223* 01/11/2015 1326   ALKPHOS 78 07/10/2013 0932   ALKPHOS 82 09/25/2011 0947   AST 99* 01/11/2015 1326   AST 13 07/10/2013 0932   AST 7* 09/25/2011 0947   ALT 113* 01/11/2015 1326   ALT 17 07/10/2013 0932   ALT 14 09/25/2011 0947   BILITOT 0.41 01/11/2015 1326   BILITOT 0.9 07/10/2013 0932   BILITOT 0.40 09/25/2011 0947       RADIOGRAPHIC STUDIES: Dg Chest 2 View  12/28/2014   CLINICAL DATA:  Two week history of right anterior chest pain over the eighth through tenth ribs, unexplained weight loss, no history of trauma.  EXAM: CHEST  2 VIEW  COMPARISON:  Thoracic spine series of November 08, 2014 and [place] and lateral chest x-ray of September 02, 2013  FINDINGS: The lungs are well-expanded. The interstitial markings are diffusely increased and have become more conspicuous in the retrocardiac region on the lateral film. There is no alveolar infiltrate. There is stable apical pleural thickening bilaterally. There is no pleural effusion. The cardiac silhouette is top-normal in size but stable. The pulmonary vascularity is not engorged. The observed portions of the bony thorax are unremarkable. Specific attention to the right ribs reveals no definite acute abnormality. The right clavicle is intact.  IMPRESSION: Chronic bilateral pulmonary fibrotic changes slightly more conspicuous over the lower lobes since the previous study which may reflect superimposed pneumonia. Follow-up radiographs following any antibiotic therapy would be useful.  If the patient's symptoms persist and remain unexplained, further evaluation with chest CT scanning would be useful.  The observed portions of the bony thorax exhibit no acute abnormalities.   Electronically Signed    By: David  Martinique   On: 12/28/2014 16:52   Ct Chest W Contrast  12/30/2014   CLINICAL DATA:  Followup abnormal chest x-ray.  12/28/2014  EXAM: CT CHEST WITH CONTRAST  TECHNIQUE: Multidetector CT imaging of the chest was performed during intravenous contrast administration.  CONTRAST:  41mL OMNIPAQUE IOHEXOL 300 MG/ML  SOLN  COMPARISON:  PET-CT 10/18/2010  FINDINGS: Mediastinum: Mild cardiac enlargement. There is calcified atherosclerotic disease involving the thoracic aorta as well as the LAD and left circumflex coronary artery. Enlarged right hilar lymph node measures 2 cm. Sub- carinal lymph node is enlarged measuring 1.3 cm. Central left hilar mass measures measures 2 cm, image 26/series 2.  Lungs/Pleura: No pleural effusion. There is advanced, lower lobe predominant interstitial reticulation, subpleural honeycombing and traction bronchiectasis compatible with pulmonary fibrosis. Subpleural nodule within the left lower lobe measures 1 cm, image 27/series 3.  Upper Abdomen: Numerous lesions are identified throughout both lobes of the liver. Index lesion within the right hepatic lobe measures 3.4 cm, image 50/ series 3. Lateral segment of left lobe of liver lesion measures 1.9 cm, image 49/series 2. Small stones are identified within the gallbladder. No biliary dilatation. The visualized portions of the liver are normal. Calcified granuloma identified within the spleen. The adrenal glands are unremarkable.  Musculoskeletal: No aggressive lytic or sclerotic bone lesions.  IMPRESSION: 1. Central left hilar mass is identified and worrisome for bronchogenic carcinoma. Consider further evaluation with PET-CT and tissue sampling. The left central hilar mass should be easily amendable to bronchoscopic biopsy. 2. Evidence of extensive metastasis throughout the liver. 3. Chronic interstitial lung disease compatible of pulmonary fibrosis. 4. Atherosclerotic disease including LAD coronary artery calcification.    Electronically Signed   By: Kerby Moors M.D.   On: 12/30/2014 15:39   Mr Jeri Cos IP Contrast  01/20/2015   CLINICAL DATA:  Tonsillar cancer.  Staging exam.  Initial encounter.  EXAM: MRI HEAD WITHOUT AND WITH CONTRAST  TECHNIQUE: Multiplanar, multiecho pulse sequences of the brain and surrounding structures were obtained without and with intravenous contrast.  CONTRAST:  55mL MULTIHANCE GADOBENATE DIMEGLUMINE 529 MG/ML IV SOLN  COMPARISON:  CT head 08/12/2011.  FINDINGS: No evidence for acute stroke, acute hemorrhage, intracranial mass lesion, or extra-axial fluid. Moderate cortical atrophy. Extensive white matter disease. Hydrocephalus ex vacuo.  Flow voids are maintained. No chronic hemorrhage or tonsillar herniation. Unremarkable pituitary. No osseous lesions. Post infusion, no abnormal enhancement of the brain or meninges.  Compared with prior CT, ventricular enlargement remains prominent.  This is not favored to represent normal pressure hydrocephalus however.  IMPRESSION: Chronic changes as described. No abnormal enhancement to suggest intracranial metastatic disease.   Electronically Signed   By: Rolla Flatten M.D.   On: 01/20/2015 15:47   Mr Jeri Cos SW Contrast  01/20/2015   CLINICAL DATA:  Tonsillar cancer.  Staging exam.  Initial encounter.  EXAM: MRI HEAD WITHOUT AND WITH CONTRAST  TECHNIQUE: Multiplanar, multiecho pulse sequences of the brain and surrounding structures were obtained without and with intravenous contrast.  CONTRAST:  31mL MULTIHANCE GADOBENATE DIMEGLUMINE 529 MG/ML IV SOLN  COMPARISON:  CT head 08/12/2011.  FINDINGS: No evidence for acute stroke, acute hemorrhage, intracranial mass lesion, or extra-axial fluid. Moderate cortical atrophy. Extensive white matter disease. Hydrocephalus ex vacuo.  Flow voids are maintained. No chronic hemorrhage or tonsillar herniation. Unremarkable pituitary. No osseous lesions. Post infusion, no abnormal enhancement of the brain or meninges.  Compared  with prior CT, ventricular enlargement remains prominent. This is not favored to represent normal pressure hydrocephalus however.  IMPRESSION: Chronic changes as described. No abnormal enhancement to suggest intracranial metastatic disease.   Electronically Signed   By: Rolla Flatten M.D.   On: 01/20/2015 15:47   US Biopsy  01/13/2015   CLINICAL DATA:  81 year old with a left central hilar lesion and suspicious liver lesions. Tissue diagnosis is needed.  EXAM: ULTRASOUND-GUIDED LIVER LESION BIOPSY  Physician: Stephan Minister. Anselm Pancoast, MD  FLUOROSCOPY TIME:  None  MEDICATIONS: 1 mg versed, 50 mcg fentanyl. A radiology nurse monitored the patient for moderate sedation.  ANESTHESIA/SEDATION: Moderate sedation time: 15 minutes  PROCEDURE: The procedure was explained to the patient. The risks and benefits of the procedure were discussed and the patient's questions were addressed. Informed consent was obtained from the patient. The liver was evaluated with ultrasound. A lesion in the right hepatic lobe was identified. Skin was prepped and draped in sterile fashion. 1% lidocaine was used for local anesthetic. A 17 gauge needle was directed into the lesion and a total of 5 core biopsies were obtained with an 18 gauge device. Three of the samples were scant. Specimens were placed in formalin. 17 gauge needle was removed without complication. Bandage placed over the puncture site.  FINDINGS: Liver is diffusely heterogeneous and there are not well-defined lesions. There is a subtle heterogeneous lesion in the right hepatic lobe measuring up to 4.7 cm. Needle position confirmed within this lesion. Two of the core samples appeared to be adequate.  Estimated blood loss: Minimal  COMPLICATIONS: None  IMPRESSION: Ultrasound-guided core biopsies of a right hepatic lesion.   Electronically Signed   By: Markus Daft M.D.   On: 01/13/2015 17:50    ASSESSMENT AND PLAN: This is a very pleasant 81 years old white male with history of stage IVA  squamous cell carcinoma of the right tonsillar fossae status post course of concurrent chemoradiation and he was recently diagnosed with extensive stage small cell lung cancer with multiple liver metastasis. His PET scan is still pending but MRI of the brain showed no evidence for metastatic disease to the brain. I had a lengthy discussion with the patient and his family today about his current disease stage, prognosis and treatment options. I gave the patient the option of palliative care and hospice referral versus consideration of systemic chemotherapy with carboplatin for AUC of 5 on day 1 and etoposide 100 MG/M2 on days 1, 2 and 3 with Neulasta support on day 4. The patient and his family are  interested in proceeding with systemic chemotherapy. I discussed with the patient adverse effect of this treatment including but not limited to alopecia, myelosuppression, nausea and vomiting, peripheral neuropathy, liver or renal dysfunction. I expect the patient to start the first cycle of his treatment on 01/26/2015 after completion of his PET scan. He would come back for follow-up visit in 2 weeks for reevaluation and management of any adverse effect of his chemotherapy. For malnutrition the patient will be referred to the dietitian at the [facility] for evaluation of his condition. For depression I will start the patient on Remeron 30 mg by mouth daily at bedtime. I will also call his pharmacy with prescription for Compazine 10 mg by mouth every 6 hours as needed for nausea. The patient was advised to call immediately if he has any concerning symptoms in the interval. The patient voices understanding of current disease status and treatment options and is in agreement with the current care plan.  All questions were answered. The patient knows to call the clinic with any problems, questions or concerns. We can certainly see the patient much sooner if necessary.  I spent 20 minutes counseling the  patient face to face. The total time spent in the appointment was 30 minutes.  Disclaimer: This note was dictated with voice recognition software. Similar sounding words can inadvertently be transcribed and may not be corrected upon review.

## 2015-01-22 MED ORDER — MIRTAZAPINE 30 MG PO TABS: 30.0000 mg | ORAL_TABLET | Freq: Every day | ORAL | Status: AC

## 2015-01-22 MED ORDER — PROCHLORPERAZINE MALEATE 10 MG PO TABS: 10.0000 mg | ORAL_TABLET | Freq: Four times a day (QID) | ORAL | Status: AC | PRN

## 2015-01-25 ENCOUNTER — Telehealth: Payer: Self-pay | Admitting: *Deleted

## 2015-01-25 ENCOUNTER — Ambulatory Visit (HOSPITAL_COMMUNITY)
Admission: RE | Admit: 2015-01-25 | Discharge: 2015-01-25 | Disposition: A | Discharge status: Home / Self Care | Payer: Medicare HMO | Source: Ambulatory Visit | Attending: Internal Medicine | Admitting: Internal Medicine

## 2015-01-25 ENCOUNTER — Other Ambulatory Visit (HOSPITAL_COMMUNITY): Payer: Medicare HMO

## 2015-01-25 DIAGNOSIS — C3490 Malignant neoplasm of unspecified part of unspecified bronchus or lung: Secondary | ICD-10-CM | POA: Insufficient documentation

## 2015-01-25 DIAGNOSIS — Z87891 Personal history of nicotine dependence: Secondary | ICD-10-CM | POA: Insufficient documentation

## 2015-01-25 DIAGNOSIS — C029 Malignant neoplasm of tongue, unspecified: Secondary | ICD-10-CM | POA: Diagnosis not present

## 2015-01-25 LAB — GLUCOSE, CAPILLARY: GLUCOSE-CAPILLARY: 138 mg/dL — AB (ref 70–99)

## 2015-01-25 MED ORDER — FLUDEOXYGLUCOSE F - 18 (FDG) INJECTION
7.3500 | Freq: Once | INTRAVENOUS | Status: AC | PRN
  Administered 2015-01-25: 7.35 via INTRAVENOUS

## 2015-01-25 NOTE — Telephone Encounter (Signed)
Called patient and wife answered the phone.  I updated her that prescription for medication was at pharmacy.

## 2015-01-26 ENCOUNTER — Ambulatory Visit (HOSPITAL_BASED_OUTPATIENT_CLINIC_OR_DEPARTMENT_OTHER): Payer: Medicare HMO

## 2015-01-26 ENCOUNTER — Other Ambulatory Visit (HOSPITAL_BASED_OUTPATIENT_CLINIC_OR_DEPARTMENT_OTHER): Payer: Medicare HMO

## 2015-01-26 VITALS — BP 116/68 | HR 90 | Temp 97.2°F | Resp 18

## 2015-01-26 DIAGNOSIS — Z5111 Encounter for antineoplastic chemotherapy: Secondary | ICD-10-CM

## 2015-01-26 DIAGNOSIS — C3490 Malignant neoplasm of unspecified part of unspecified bronchus or lung: Secondary | ICD-10-CM

## 2015-01-26 DIAGNOSIS — C787 Secondary malignant neoplasm of liver and intrahepatic bile duct: Secondary | ICD-10-CM

## 2015-01-26 LAB — CBC WITH DIFFERENTIAL/PLATELET
BASO%: 0.6 % (ref 0.0–2.0)
BASOS ABS: 0 10*3/uL (ref 0.0–0.1)
EOS ABS: 0.2 10*3/uL (ref 0.0–0.5)
EOS%: 1.9 % (ref 0.0–7.0)
HCT: 38.1 % — ABNORMAL LOW (ref 38.4–49.9)
HEMOGLOBIN: 12.1 g/dL — AB (ref 13.0–17.1)
LYMPH%: 16.5 % (ref 14.0–49.0)
MCH: 29.4 pg (ref 27.2–33.4)
MCHC: 31.7 g/dL — ABNORMAL LOW (ref 32.0–36.0)
MCV: 92.9 fL (ref 79.3–98.0)
MONO#: 0.5 10*3/uL (ref 0.1–0.9)
MONO%: 5.7 % (ref 0.0–14.0)
NEUT%: 75.3 % — ABNORMAL HIGH (ref 39.0–75.0)
NEUTROS ABS: 6.2 10*3/uL (ref 1.5–6.5)
Platelets: 271 10*3/uL (ref 140–400)
RBC: 4.11 10*6/uL — AB (ref 4.20–5.82)
RDW: 14.9 % — ABNORMAL HIGH (ref 11.0–14.6)
WBC: 8.2 10*3/uL (ref 4.0–10.3)
lymph#: 1.4 10*3/uL (ref 0.9–3.3)

## 2015-01-26 LAB — COMPREHENSIVE METABOLIC PANEL (CC13)
ALBUMIN: 2.7 g/dL — AB (ref 3.5–5.0)
ALK PHOS: 308 U/L — AB (ref 40–150)
ALT: 101 U/L — AB (ref 0–55)
AST: 75 U/L — AB (ref 5–34)
Anion Gap: 8 mEq/L (ref 3–11)
BILIRUBIN TOTAL: 0.52 mg/dL (ref 0.20–1.20)
BUN: 32 mg/dL — ABNORMAL HIGH (ref 7.0–26.0)
CO2: 23 mEq/L (ref 22–29)
Calcium: 9.3 mg/dL (ref 8.4–10.4)
Chloride: 110 mEq/L — ABNORMAL HIGH (ref 98–109)
Creatinine: 1.4 mg/dL — ABNORMAL HIGH (ref 0.7–1.3)
EGFR: 48 mL/min/{1.73_m2} — AB (ref >90)
GLUCOSE: 109 mg/dL (ref 70–140)
POTASSIUM: 4.1 meq/L (ref 3.5–5.1)
Sodium: 142 mEq/L (ref 136–145)
TOTAL PROTEIN: 7.2 g/dL (ref 6.4–8.3)

## 2015-01-26 MED ORDER — SODIUM CHLORIDE 0.9 % IV SOLN
100.0000 mg/m2 | Freq: Once | INTRAVENOUS | Status: AC
  Administered 2015-01-26: 180 mg via INTRAVENOUS
  Filled 2015-01-26: qty 9

## 2015-01-26 MED ORDER — SODIUM CHLORIDE 0.9 % IV SOLN
341.0000 mg | Freq: Once | INTRAVENOUS | Status: AC
  Administered 2015-01-26: 340 mg via INTRAVENOUS
  Filled 2015-01-26: qty 34

## 2015-01-26 MED ORDER — SODIUM CHLORIDE 0.9 % IV SOLN
Freq: Once | INTRAVENOUS | Status: AC
  Administered 2015-01-26: 13:00:00 via INTRAVENOUS
  Filled 2015-01-26: qty 8

## 2015-01-26 MED ORDER — SODIUM CHLORIDE 0.9 % IV SOLN
Freq: Once | INTRAVENOUS | Status: AC
  Administered 2015-01-26: 13:00:00 via INTRAVENOUS

## 2015-01-26 NOTE — Progress Notes (Signed)
Per Dr Julien Nordmann it is okay to treat pt with chemo today using labs from 5 days ago.

## 2015-01-26 NOTE — Progress Notes (Signed)
Pt advised to take in fluids.  Pt wife reports pt has not been drinking much.  Advised pt to call clinic if pt is not taking in adequate fluids, has nausea or vomiting or diarrhea and we can give him IV fluids.  She voiced understanding.  Went over anti-emetics and neulasta with pt and wife.  They voiced understanding.  Pt discharged to home via wheelchair with wife.

## 2015-01-26 NOTE — Patient Instructions (Addendum)
[facility] Discharge Instructions for Patients Receiving Chemotherapy  Today you received the following chemotherapy agents: carboplatin, etoposide  To help prevent nausea and vomiting after your treatment, we encourage you to take your nausea medication.  Take it as often as prescribed.     If you develop nausea and vomiting that is not controlled by your nausea medication, call the clinic. If it is after clinic hours your family physician or the after hours number for the clinic or go to the Emergency Department.   BELOW ARE SYMPTOMS THAT SHOULD BE REPORTED IMMEDIATELY:  *FEVER GREATER THAN 100.5 F  *CHILLS WITH OR WITHOUT FEVER  NAUSEA AND VOMITING THAT IS NOT CONTROLLED WITH YOUR NAUSEA MEDICATION  *UNUSUAL SHORTNESS OF BREATH  *UNUSUAL BRUISING OR BLEEDING  TENDERNESS IN MOUTH AND THROAT WITH OR WITHOUT PRESENCE OF ULCERS  *URINARY PROBLEMS  *BOWEL PROBLEMS  UNUSUAL RASH Items with * indicate a potential emergency and should be followed up as soon as possible.  Feel free to call the clinic you have any questions or concerns. The clinic phone number is [phone].   I have been informed and understand all the instructions given to me. I know to contact the clinic, my physician, or go to the Emergency Department if any problems should occur. I do not have any questions at this time, but understand that I may call the clinic during office hours   should I have any questions or need assistance in obtaining follow up care.    __________________________________________  _____________  __________ Signature of Patient or Authorized Representative            Date                   Time    __________________________________________ Nurse's Signature   Carboplatin injection What is this medicine? CARBOPLATIN (KAR boe pla tin) is a chemotherapy drug. It targets fast dividing cells, like cancer cells, and causes these cells to die. This medicine is used to  treat ovarian cancer and many other cancers. This medicine may be used for other purposes; ask your health care provider or pharmacist if you have questions. COMMON BRAND NAME(S): Paraplatin What should I tell my health care provider before I take this medicine? They need to know if you have any of these conditions: -blood disorders -hearing problems -kidney disease -recent or ongoing radiation therapy -an unusual or allergic reaction to carboplatin, cisplatin, other chemotherapy, other medicines, foods, dyes, or preservatives -pregnant or trying to get pregnant -breast-feeding How should I use this medicine? This drug is usually given as an infusion into a vein. It is administered in a hospital or clinic by a specially trained health care professional. Talk to your pediatrician regarding the use of this medicine in children. Special care may be needed. Overdosage: If you think you have taken too much of this medicine contact a poison control center or emergency room at once. NOTE: This medicine is only for you. Do not share this medicine with others. What if I miss a dose? It is important not to miss a dose. Call your doctor or health care professional if you are unable to keep an appointment. What may interact with this medicine? -medicines for seizures -medicines to increase blood counts like filgrastim, pegfilgrastim, sargramostim -some antibiotics like amikacin, gentamicin, neomycin, streptomycin, tobramycin -vaccines Talk to your doctor or health care professional before taking any of these medicines: -acetaminophen -aspirin -ibuprofen -ketoprofen -naproxen This list may not describe all  possible interactions. Give your health care provider a list of all the medicines, herbs, non-prescription drugs, or dietary supplements you use. Also tell them if you smoke, drink alcohol, or use illegal drugs. Some items may interact with your medicine. What should I watch for while using this  medicine? Your condition will be monitored carefully while you are receiving this medicine. You will need important blood work done while you are taking this medicine. This drug may make you feel generally unwell. This is not uncommon, as chemotherapy can affect healthy cells as well as cancer cells. Report any side effects. Continue your course of treatment even though you feel ill unless your doctor tells you to stop. In some cases, you may be given additional medicines to help with side effects. Follow all directions for their use. Call your doctor or health care professional for advice if you get a fever, chills or sore throat, or other symptoms of a cold or flu. Do not treat yourself. This drug decreases your body's ability to fight infections. Try to avoid being around people who are sick. This medicine may increase your risk to bruise or bleed. Call your doctor or health care professional if you notice any unusual bleeding. Be careful brushing and flossing your teeth or using a toothpick because you may get an infection or bleed more easily. If you have any dental work done, tell your dentist you are receiving this medicine. Avoid taking products that contain aspirin, acetaminophen, ibuprofen, naproxen, or ketoprofen unless instructed by your doctor. These medicines may hide a fever. Do not become pregnant while taking this medicine. Women should inform their doctor if they wish to become pregnant or think they might be pregnant. There is a potential for serious side effects to an unborn child. Talk to your health care professional or pharmacist for more information. Do not breast-feed an infant while taking this medicine. What side effects may I notice from receiving this medicine? Side effects that you should report to your doctor or health care professional as soon as possible: -allergic reactions like skin rash, itching or hives, swelling of the face, lips, or tongue -signs of infection -  fever or chills, cough, sore throat, pain or difficulty passing urine -signs of decreased platelets or bleeding - bruising, pinpoint red spots on the skin, black, tarry stools, nosebleeds -signs of decreased red blood cells - unusually weak or tired, fainting spells, lightheadedness -breathing problems -changes in hearing -changes in vision -chest pain -high blood pressure -low blood counts - This drug may decrease the number of white blood cells, red blood cells and platelets. You may be at increased risk for infections and bleeding. -nausea and vomiting -pain, swelling, redness or irritation at the injection site -pain, tingling, numbness in the hands or feet -problems with balance, talking, walking -trouble passing urine or change in the amount of urine Side effects that usually do not require medical attention (report to your doctor or health care professional if they continue or are bothersome): -hair loss -loss of appetite -metallic taste in the mouth or changes in taste This list may not describe all possible side effects. Call your doctor for medical advice about side effects. You may report side effects to FDA at 1-800-FDA-1088. Where should I keep my medicine? This drug is given in a hospital or clinic and will not be stored at home. NOTE: This sheet is a summary. It may not cover all possible information. If you have questions about this medicine, talk to  your doctor, pharmacist, or health care provider.  2015, Elsevier/Gold Standard. (2008-01-06 14:38:05)   Etoposide, VP-16 injection What is this medicine? ETOPOSIDE, VP-16 (e toe POE side) is a chemotherapy drug. It is used to treat testicular cancer, lung cancer, and other cancers. This medicine may be used for other purposes; ask your health care provider or pharmacist if you have questions. COMMON BRAND NAME(S): Etopophos, Toposar, VePesid What should I tell my health care provider before I take this medicine? They need  to know if you have any of these conditions: -infection -kidney disease -low blood counts, like low white cell, platelet, or red cell counts -an unusual or allergic reaction to etoposide, other chemotherapeutic agents, other medicines, foods, dyes, or preservatives -pregnant or trying to get pregnant -breast-feeding How should I use this medicine? This medicine is for infusion into a vein. It is administered in a hospital or clinic by a specially trained health care professional. Talk to your pediatrician regarding the use of this medicine in children. Special care may be needed. Overdosage: If you think you have taken too much of this medicine contact a poison control center or emergency room at once. NOTE: This medicine is only for you. Do not share this medicine with others. What if I miss a dose? It is important not to miss your dose. Call your doctor or health care professional if you are unable to keep an appointment. What may interact with this medicine? -cyclosporine -medicines to increase blood counts like filgrastim, pegfilgrastim, sargramostim -vaccines This list may not describe all possible interactions. Give your health care provider a list of all the medicines, herbs, non-prescription drugs, or dietary supplements you use. Also tell them if you smoke, drink alcohol, or use illegal drugs. Some items may interact with your medicine. What should I watch for while using this medicine? Visit your doctor for checks on your progress. This drug may make you feel generally unwell. This is not uncommon, as chemotherapy can affect healthy cells as well as cancer cells. Report any side effects. Continue your course of treatment even though you feel ill unless your doctor tells you to stop. In some cases, you may be given additional medicines to help with side effects. Follow all directions for their use. Call your doctor or health care professional for advice if you get a fever, chills or  sore throat, or other symptoms of a cold or flu. Do not treat yourself. This drug decreases your body's ability to fight infections. Try to avoid being around people who are sick. This medicine may increase your risk to bruise or bleed. Call your doctor or health care professional if you notice any unusual bleeding. Be careful brushing and flossing your teeth or using a toothpick because you may get an infection or bleed more easily. If you have any dental work done, tell your dentist you are receiving this medicine. Avoid taking products that contain aspirin, acetaminophen, ibuprofen, naproxen, or ketoprofen unless instructed by your doctor. These medicines may hide a fever. Do not become pregnant while taking this medicine. Women should inform their doctor if they wish to become pregnant or think they might be pregnant. There is a potential for serious side effects to an unborn child. Talk to your health care professional or pharmacist for more information. Do not breast-feed an infant while taking this medicine. What side effects may I notice from receiving this medicine? Side effects that you should report to your doctor or health care professional as soon as  possible: -allergic reactions like skin rash, itching or hives, swelling of the face, lips, or tongue -low blood counts - this medicine may decrease the number of white blood cells, red blood cells and platelets. You may be at increased risk for infections and bleeding. -signs of infection - fever or chills, cough, sore throat, pain or difficulty passing urine -signs of decreased platelets or bleeding - bruising, pinpoint red spots on the skin, black, tarry stools, blood in the urine -signs of decreased red blood cells - unusually weak or tired, fainting spells, lightheadedness -breathing problems -changes in vision -mouth or throat sores or ulcers -pain, redness, swelling or irritation at the injection site -pain, tingling, numbness in the  hands or feet -redness, blistering, peeling or loosening of the skin, including inside the mouth -seizures -vomiting Side effects that usually do not require medical attention (report to your doctor or health care professional if they continue or are bothersome): -diarrhea -hair loss -loss of appetite -nausea -stomach pain This list may not describe all possible side effects. Call your doctor for medical advice about side effects. You may report side effects to FDA at 1-800-FDA-1088. Where should I keep my medicine? This drug is given in a hospital or clinic and will not be stored at home. NOTE: This sheet is a summary. It may not cover all possible information. If you have questions about this medicine, talk to your doctor, pharmacist, or health care provider.  2015, Elsevier/Gold Standard. (2008-02-02 17:24:12)   Pegfilgrastim injection What is this medicine? PEGFILGRASTIM (peg fil GRA stim) is a long-acting granulocyte colony-stimulating factor that stimulates the growth of neutrophils, a type of white blood cell important in the body's fight against infection. It is used to reduce the incidence of fever and infection in patients with certain types of cancer who are receiving chemotherapy that affects the bone marrow. This medicine may be used for other purposes; ask your health care provider or pharmacist if you have questions. COMMON BRAND NAME(S): Neulasta What should I tell my health care provider before I take this medicine? They need to know if you have any of these conditions: -latex allergy -ongoing radiation therapy -sickle cell disease -skin reactions to acrylic adhesives (On-Body Injector only) -an unusual or allergic reaction to pegfilgrastim, filgrastim, other medicines, foods, dyes, or preservatives -pregnant or trying to get pregnant -breast-feeding How should I use this medicine? This medicine is for injection under the skin. If you get this medicine at home, you  will be taught how to prepare and give the pre-filled syringe or how to use the On-body Injector. Refer to the patient Instructions for Use for detailed instructions. Use exactly as directed. Take your medicine at regular intervals. Do not take your medicine more often than directed. It is important that you put your used needles and syringes in a special sharps container. Do not put them in a trash can. If you do not have a sharps container, call your pharmacist or healthcare provider to get one. Talk to your pediatrician regarding the use of this medicine in children. Special care may be needed. Overdosage: If you think you have taken too much of this medicine contact a poison control center or emergency room at once. NOTE: This medicine is only for you. Do not share this medicine with others. What if I miss a dose? It is important not to miss your dose. Call your doctor or health care professional if you miss your dose. If you miss a dose due to  an On-body Injector failure or leakage, a new dose should be administered as soon as possible using a single prefilled syringe for manual use. What may interact with this medicine? Interactions have not been studied. Give your health care provider a list of all the medicines, herbs, non-prescription drugs, or dietary supplements you use. Also tell them if you smoke, drink alcohol, or use illegal drugs. Some items may interact with your medicine. This list may not describe all possible interactions. Give your health care provider a list of all the medicines, herbs, non-prescription drugs, or dietary supplements you use. Also tell them if you smoke, drink alcohol, or use illegal drugs. Some items may interact with your medicine. What should I watch for while using this medicine? You may need blood work done while you are taking this medicine. If you are going to need a MRI, CT scan, or other procedure, tell your doctor that you are using this medicine (On-Body  Injector only). What side effects may I notice from receiving this medicine? Side effects that you should report to your doctor or health care professional as soon as possible: -allergic reactions like skin rash, itching or hives, swelling of the face, lips, or tongue -dizziness -fever -pain, redness, or irritation at site where injected -pinpoint red spots on the skin -shortness of breath or breathing problems -stomach or side pain, or pain at the shoulder -swelling -tiredness -trouble passing urine Side effects that usually do not require medical attention (report to your doctor or health care professional if they continue or are bothersome): -bone pain -muscle pain This list may not describe all possible side effects. Call your doctor for medical advice about side effects. You may report side effects to FDA at 1-800-FDA-1088. Where should I keep my medicine? Keep out of the reach of children. Store pre-filled syringes in a refrigerator between 2 and 8 degrees C (36 and 46 degrees F). Do not freeze. Keep in carton to protect from light. Throw away this medicine if it is left out of the refrigerator for more than 48 hours. Throw away any unused medicine after the expiration date. NOTE: This sheet is a summary. It may not cover all possible information. If you have questions about this medicine, talk to your doctor, pharmacist, or health care provider.  2015, Elsevier/Gold Standard. (2013-12-31 16:14:05)

## 2015-01-27 ENCOUNTER — Ambulatory Visit (INDEPENDENT_AMBULATORY_CARE_PROVIDER_SITE_OTHER): Payer: Medicare HMO | Admitting: General Practice

## 2015-01-27 ENCOUNTER — Ambulatory Visit (HOSPITAL_BASED_OUTPATIENT_CLINIC_OR_DEPARTMENT_OTHER): Payer: Medicare HMO

## 2015-01-27 ENCOUNTER — Ambulatory Visit: Payer: Medicare HMO | Admitting: Neurology

## 2015-01-27 ENCOUNTER — Telehealth: Payer: Self-pay | Admitting: Medical Oncology

## 2015-01-27 ENCOUNTER — Other Ambulatory Visit: Payer: Medicare HMO

## 2015-01-27 VITALS — BP 159/73 | HR 75 | Temp 97.3°F | Resp 16

## 2015-01-27 DIAGNOSIS — Z5111 Encounter for antineoplastic chemotherapy: Secondary | ICD-10-CM

## 2015-01-27 DIAGNOSIS — I82432 Acute embolism and thrombosis of left popliteal vein: Secondary | ICD-10-CM

## 2015-01-27 DIAGNOSIS — C3490 Malignant neoplasm of unspecified part of unspecified bronchus or lung: Secondary | ICD-10-CM

## 2015-01-27 DIAGNOSIS — C787 Secondary malignant neoplasm of liver and intrahepatic bile duct: Secondary | ICD-10-CM | POA: Diagnosis not present

## 2015-01-27 MED ORDER — PROCHLORPERAZINE MALEATE 10 MG PO TABS
ORAL_TABLET | ORAL | Status: AC
  Filled 2015-01-27: qty 1

## 2015-01-27 MED ORDER — SODIUM CHLORIDE 0.9 % IV SOLN
Freq: Once | INTRAVENOUS | Status: AC
  Administered 2015-01-27: 15:00:00 via INTRAVENOUS

## 2015-01-27 MED ORDER — SODIUM CHLORIDE 0.9 % IV SOLN
Freq: Once | INTRAVENOUS | Status: AC
  Administered 2015-01-27: 16:00:00 via INTRAVENOUS

## 2015-01-27 MED ORDER — SODIUM CHLORIDE 0.9 % IV SOLN
100.0000 mg/m2 | Freq: Once | INTRAVENOUS | Status: AC
  Administered 2015-01-27: 180 mg via INTRAVENOUS
  Filled 2015-01-27: qty 9

## 2015-01-27 MED ORDER — PROCHLORPERAZINE MALEATE 10 MG PO TABS
10.0000 mg | ORAL_TABLET | Freq: Once | ORAL | Status: AC
  Administered 2015-01-27: 10 mg via ORAL

## 2015-01-27 NOTE — Telephone Encounter (Signed)
Pt doing well . No nausea. Eating breakfast. Wife states she would like pt to get extra IV fluids today because he cannot drink 60 oz /day. Note to [place].

## 2015-01-27 NOTE — Patient Instructions (Signed)
[facility] Discharge Instructions for Patients Receiving Chemotherapy  Today you received the following chemotherapy agents Vepesid  To help prevent nausea and vomiting after your treatment, we encourage you to take your nausea medication as directed by your doctor.   If you develop nausea and vomiting that is not controlled by your nausea medication, call the clinic.   BELOW ARE SYMPTOMS THAT SHOULD BE REPORTED IMMEDIATELY:  *FEVER GREATER THAN 100.5 F  *CHILLS WITH OR WITHOUT FEVER  NAUSEA AND VOMITING THAT IS NOT CONTROLLED WITH YOUR NAUSEA MEDICATION  *UNUSUAL SHORTNESS OF BREATH  *UNUSUAL BRUISING OR BLEEDING  TENDERNESS IN MOUTH AND THROAT WITH OR WITHOUT PRESENCE OF ULCERS  *URINARY PROBLEMS  *BOWEL PROBLEMS  UNUSUAL RASH Items with * indicate a potential emergency and should be followed up as soon as possible.  Feel free to call the clinic you have any questions or concerns. The clinic phone number is [phone].  Please show the [facility] at check-in to the Emergency Department and triage nurse.

## 2015-01-27 NOTE — Progress Notes (Signed)
Dr. Julien Nordmann called and orders received to administer 500 ml NS over 1 hour. Patient is not drinking at home per wife.

## 2015-01-28 ENCOUNTER — Other Ambulatory Visit: Payer: Self-pay | Admitting: Medical Oncology

## 2015-01-28 ENCOUNTER — Ambulatory Visit: Payer: Medicare HMO

## 2015-01-28 ENCOUNTER — Telehealth: Payer: Self-pay | Admitting: Family Medicine

## 2015-01-28 ENCOUNTER — Ambulatory Visit (HOSPITAL_BASED_OUTPATIENT_CLINIC_OR_DEPARTMENT_OTHER): Payer: Medicare HMO

## 2015-01-28 VITALS — BP 138/76 | HR 84 | Resp 16

## 2015-01-28 DIAGNOSIS — Z5111 Encounter for antineoplastic chemotherapy: Secondary | ICD-10-CM | POA: Diagnosis not present

## 2015-01-28 DIAGNOSIS — C3490 Malignant neoplasm of unspecified part of unspecified bronchus or lung: Secondary | ICD-10-CM

## 2015-01-28 DIAGNOSIS — E860 Dehydration: Secondary | ICD-10-CM

## 2015-01-28 DIAGNOSIS — C787 Secondary malignant neoplasm of liver and intrahepatic bile duct: Secondary | ICD-10-CM | POA: Diagnosis not present

## 2015-01-28 DIAGNOSIS — I878 Other specified disorders of veins: Secondary | ICD-10-CM

## 2015-01-28 MED ORDER — SODIUM CHLORIDE 0.9 % IV SOLN
Freq: Once | INTRAVENOUS | Status: AC
  Administered 2015-01-28: 15:00:00 via INTRAVENOUS

## 2015-01-28 MED ORDER — PROCHLORPERAZINE MALEATE 10 MG PO TABS
ORAL_TABLET | ORAL | Status: AC
  Filled 2015-01-28: qty 1

## 2015-01-28 MED ORDER — PROCHLORPERAZINE MALEATE 10 MG PO TABS
10.0000 mg | ORAL_TABLET | Freq: Once | ORAL | Status: AC
  Administered 2015-01-28: 10 mg via ORAL

## 2015-01-28 MED ORDER — SODIUM CHLORIDE 0.9 % IV SOLN
100.0000 mg/m2 | Freq: Once | INTRAVENOUS | Status: AC
  Administered 2015-01-28: 180 mg via INTRAVENOUS
  Filled 2015-01-28: qty 9

## 2015-01-28 NOTE — Telephone Encounter (Signed)
Patient's wife states patient has extreme fatigue since he has been on chemo therapy.  She would like a script so she can get him a walker with a seat attached.  She said if you can get it to go through [facility], she will go there.  However, [facility], [facility], occupational were some of the names giving.   She asks if you can please give her a callback to let her know what to do next.

## 2015-01-28 NOTE — Telephone Encounter (Signed)
Order faxed to [facility]

## 2015-01-29 ENCOUNTER — Ambulatory Visit: Payer: Medicare HMO

## 2015-01-31 ENCOUNTER — Telehealth: Payer: Self-pay | Admitting: Nurse Practitioner

## 2015-01-31 ENCOUNTER — Telehealth: Payer: Self-pay | Admitting: *Deleted

## 2015-01-31 ENCOUNTER — Ambulatory Visit: Payer: Medicare HMO

## 2015-01-31 ENCOUNTER — Ambulatory Visit (HOSPITAL_BASED_OUTPATIENT_CLINIC_OR_DEPARTMENT_OTHER): Payer: Medicare HMO

## 2015-01-31 ENCOUNTER — Ambulatory Visit (HOSPITAL_BASED_OUTPATIENT_CLINIC_OR_DEPARTMENT_OTHER): Payer: Medicare HMO | Admitting: Nurse Practitioner

## 2015-01-31 ENCOUNTER — Other Ambulatory Visit (HOSPITAL_BASED_OUTPATIENT_CLINIC_OR_DEPARTMENT_OTHER): Payer: Medicare HMO

## 2015-01-31 VITALS — BP 122/59 | HR 105 | Temp 97.6°F | Resp 18

## 2015-01-31 VITALS — BP 144/64 | HR 110 | Resp 18

## 2015-01-31 DIAGNOSIS — C3490 Malignant neoplasm of unspecified part of unspecified bronchus or lung: Secondary | ICD-10-CM

## 2015-01-31 DIAGNOSIS — R531 Weakness: Secondary | ICD-10-CM

## 2015-01-31 DIAGNOSIS — Z5189 Encounter for other specified aftercare: Secondary | ICD-10-CM

## 2015-01-31 DIAGNOSIS — C787 Secondary malignant neoplasm of liver and intrahepatic bile duct: Secondary | ICD-10-CM | POA: Diagnosis not present

## 2015-01-31 DIAGNOSIS — N289 Disorder of kidney and ureter, unspecified: Secondary | ICD-10-CM

## 2015-01-31 DIAGNOSIS — E860 Dehydration: Secondary | ICD-10-CM

## 2015-01-31 DIAGNOSIS — R740 Nonspecific elevation of levels of transaminase and lactic acid dehydrogenase [LDH]: Secondary | ICD-10-CM

## 2015-01-31 DIAGNOSIS — G893 Neoplasm related pain (acute) (chronic): Secondary | ICD-10-CM

## 2015-01-31 DIAGNOSIS — E8809 Other disorders of plasma-protein metabolism, not elsewhere classified: Secondary | ICD-10-CM

## 2015-01-31 DIAGNOSIS — R630 Anorexia: Secondary | ICD-10-CM

## 2015-01-31 DIAGNOSIS — R1310 Dysphagia, unspecified: Secondary | ICD-10-CM

## 2015-01-31 DIAGNOSIS — R7401 Elevation of levels of liver transaminase levels: Secondary | ICD-10-CM

## 2015-01-31 DIAGNOSIS — C3432 Malignant neoplasm of lower lobe, left bronchus or lung: Secondary | ICD-10-CM

## 2015-01-31 LAB — CBC WITH DIFFERENTIAL/PLATELET
BASO%: 0.2 % (ref 0.0–2.0)
Basophils Absolute: 0 10*3/uL (ref 0.0–0.1)
EOS%: 0.7 % (ref 0.0–7.0)
Eosinophils Absolute: 0.1 10*3/uL (ref 0.0–0.5)
HEMATOCRIT: 38 % — AB (ref 38.4–49.9)
HGB: 12.1 g/dL — ABNORMAL LOW (ref 13.0–17.1)
LYMPH#: 0.7 10*3/uL — AB (ref 0.9–3.3)
LYMPH%: 8.9 % — AB (ref 14.0–49.0)
MCH: 30.3 pg (ref 27.2–33.4)
MCHC: 32 g/dL (ref 32.0–36.0)
MCV: 94.8 fL (ref 79.3–98.0)
MONO#: 0 10*3/uL — AB (ref 0.1–0.9)
MONO%: 0.6 % (ref 0.0–14.0)
NEUT#: 7.2 10*3/uL — ABNORMAL HIGH (ref 1.5–6.5)
NEUT%: 89.6 % — ABNORMAL HIGH (ref 39.0–75.0)
PLATELETS: 204 10*3/uL (ref 140–400)
RBC: 4.01 10*6/uL — ABNORMAL LOW (ref 4.20–5.82)
RDW: 15.5 % — AB (ref 11.0–14.6)
WBC: 8 10*3/uL (ref 4.0–10.3)

## 2015-01-31 LAB — URINALYSIS, MICROSCOPIC - CHCC
Bilirubin (Urine): NEGATIVE
GLUCOSE UR CHCC: 1000 mg/dL
KETONES: 80 mg/dL
LEUKOCYTE ESTERASE: NEGATIVE
Nitrite: NEGATIVE
Protein: 30 mg/dL
RBC / HPF: NEGATIVE (ref 0–2)
SPECIFIC GRAVITY, URINE: 1.025 (ref 1.003–1.035)
Urobilinogen, UR: 0.2 mg/dL (ref 0.2–1)
WBC, UA: NEGATIVE (ref 0–2)
pH: 5 (ref 4.6–8.0)

## 2015-01-31 LAB — COMPREHENSIVE METABOLIC PANEL (CC13)
ALT: 53 U/L (ref 0–55)
ANION GAP: 20 meq/L — AB (ref 3–11)
AST: 43 U/L — ABNORMAL HIGH (ref 5–34)
Albumin: 2.7 g/dL — ABNORMAL LOW (ref 3.5–5.0)
Alkaline Phosphatase: 221 U/L — ABNORMAL HIGH (ref 40–150)
BUN: 36.2 mg/dL — ABNORMAL HIGH (ref 7.0–26.0)
CALCIUM: 9.2 mg/dL (ref 8.4–10.4)
CHLORIDE: 108 meq/L (ref 98–109)
CO2: 13 mEq/L — ABNORMAL LOW (ref 22–29)
Creatinine: 1.6 mg/dL — ABNORMAL HIGH (ref 0.7–1.3)
EGFR: 41 mL/min/{1.73_m2} — AB (ref >90)
Glucose: 399 mg/dl — ABNORMAL HIGH (ref 70–140)
POTASSIUM: 4.7 meq/L (ref 3.5–5.1)
Sodium: 141 mEq/L (ref 136–145)
Total Bilirubin: 0.48 mg/dL (ref 0.20–1.20)
Total Protein: 7.2 g/dL (ref 6.4–8.3)

## 2015-01-31 MED ORDER — SODIUM CHLORIDE 0.9 % IV SOLN
Freq: Once | INTRAVENOUS | Status: AC
  Administered 2015-01-31: 13:00:00 via INTRAVENOUS

## 2015-01-31 MED ORDER — PEGFILGRASTIM INJECTION 6 MG/0.6ML ~~LOC~~
6.0000 mg | PREFILLED_SYRINGE | Freq: Once | SUBCUTANEOUS | Status: AC
  Administered 2015-01-31: 6 mg via SUBCUTANEOUS
  Filled 2015-01-31: qty 0.6

## 2015-01-31 NOTE — Progress Notes (Signed)
Neulasta injection given by infusion nurse

## 2015-01-31 NOTE — Telephone Encounter (Signed)
Left message to confirm appointment for 04/20 & 04/22

## 2015-01-31 NOTE — Telephone Encounter (Signed)
Per staff message and POF I have scheduled appts. Advised scheduler of appts. JMW  

## 2015-01-31 NOTE — Patient Instructions (Signed)
Dehydration, Adult Dehydration is when you lose more fluids from the body than you take in. Vital organs like the kidneys, brain, and heart cannot function without a proper amount of fluids and salt. Any loss of fluids from the body can cause dehydration.  CAUSES   Vomiting.  Diarrhea.  Excessive sweating.  Excessive urine output.  Fever. SYMPTOMS  Mild dehydration  Thirst.  Dry lips.  Slightly dry mouth. Moderate dehydration  Very dry mouth.  Sunken eyes.  Skin does not bounce back quickly when lightly pinched and released.  Dark urine and decreased urine production.  Decreased tear production.  Headache. Severe dehydration  Very dry mouth.  Extreme thirst.  Rapid, weak pulse (more than 100 beats per minute at rest).  Cold hands and feet.  Not able to sweat in spite of heat and temperature.  Rapid breathing.  Blue lips.  Confusion and lethargy.  Difficulty being awakened.  Minimal urine production.  No tears. DIAGNOSIS  Your caregiver will diagnose dehydration based on your symptoms and your exam. Blood and urine tests will help confirm the diagnosis. The diagnostic evaluation should also identify the cause of dehydration. TREATMENT  Treatment of mild or moderate dehydration can often be done at home by increasing the amount of fluids that you drink. It is best to drink small amounts of fluid more often. Drinking too much at one time can make vomiting worse. Refer to the home care instructions below. Severe dehydration needs to be treated at the hospital where you will probably be given intravenous (IV) fluids that contain water and electrolytes. HOME CARE INSTRUCTIONS   Ask your caregiver about specific rehydration instructions.  Drink enough fluids to keep your urine clear or pale yellow.  Drink small amounts frequently if you have nausea and vomiting.  Eat as you normally do.  Avoid:  Foods or drinks high in sugar.  Carbonated  drinks.  Juice.  Extremely hot or cold fluids.  Drinks with caffeine.  Fatty, greasy foods.  Alcohol.  Tobacco.  Overeating.  Gelatin desserts.  Wash your hands well to avoid spreading bacteria and viruses.  Only take over-the-counter or prescription medicines for pain, discomfort, or fever as directed by your caregiver.  Ask your caregiver if you should continue all prescribed and over-the-counter medicines.  Keep all follow-up appointments with your caregiver. SEEK MEDICAL CARE IF:  You have abdominal pain and it increases or stays in one area (localizes).  You have a rash, stiff neck, or severe headache.  You are irritable, sleepy, or difficult to awaken.  You are weak, dizzy, or extremely thirsty. SEEK IMMEDIATE MEDICAL CARE IF:   You are unable to keep fluids down or you get worse despite treatment.  You have frequent episodes of vomiting or diarrhea.  You have blood or green matter (bile) in your vomit.  You have blood in your stool or your stool looks black and tarry.  You have not urinated in 6 to 8 hours, or you have only urinated a small amount of very dark urine.  You have a fever.  You faint. MAKE SURE YOU:   Understand these instructions.  Will watch your condition.  Will get help right away if you are not doing well or get worse. Document Released: 10/01/2005 Document Revised: 12/24/2011 Document Reviewed: 05/21/2011 ExitCare Patient Information 2015 [employer]. This information is not intended to replace advice given to you by your health care provider. Make sure you discuss any questions you have with your health care   provider.  

## 2015-01-31 NOTE — Telephone Encounter (Addendum)
PT. IS HAVING INCREASED DIFFICULTY SWALLOWING. NO NAUSEA OR VOMITING. PT. IS WEAK AND NEEDS ASSISTANCE. THIS NOTE ROUTED TO CYNDEE BACON,NP AND STACEY CAMP,RN. VERBAL ORDER AND READ BACK TO CYNDEE BACON,NP- CBC,CMET U/A AND URINE CULTURE. NOTIFIED PT.'S WIFE.

## 2015-02-01 ENCOUNTER — Inpatient Hospital Stay (HOSPITAL_COMMUNITY)
Admission: EM | Admit: 2015-02-01 | Discharge: 2015-02-13 | DRG: 180 | Disposition: E | Payer: Medicare HMO | Attending: Family Medicine | Admitting: Family Medicine

## 2015-02-01 ENCOUNTER — Telehealth: Payer: Self-pay | Admitting: *Deleted

## 2015-02-01 ENCOUNTER — Other Ambulatory Visit: Payer: Self-pay

## 2015-02-01 ENCOUNTER — Encounter (HOSPITAL_COMMUNITY): Payer: Self-pay | Admitting: Emergency Medicine

## 2015-02-01 ENCOUNTER — Other Ambulatory Visit (HOSPITAL_COMMUNITY): Payer: Self-pay

## 2015-02-01 ENCOUNTER — Other Ambulatory Visit: Payer: Self-pay | Admitting: *Deleted

## 2015-02-01 DIAGNOSIS — E785 Hyperlipidemia, unspecified: Secondary | ICD-10-CM | POA: Diagnosis present

## 2015-02-01 DIAGNOSIS — R34 Anuria and oliguria: Secondary | ICD-10-CM | POA: Diagnosis not present

## 2015-02-01 DIAGNOSIS — Z794 Long term (current) use of insulin: Secondary | ICD-10-CM | POA: Diagnosis not present

## 2015-02-01 DIAGNOSIS — J9690 Respiratory failure, unspecified, unspecified whether with hypoxia or hypercapnia: Secondary | ICD-10-CM

## 2015-02-01 DIAGNOSIS — C3490 Malignant neoplasm of unspecified part of unspecified bronchus or lung: Principal | ICD-10-CM | POA: Diagnosis present

## 2015-02-01 DIAGNOSIS — Z8679 Personal history of other diseases of the circulatory system: Secondary | ICD-10-CM

## 2015-02-01 DIAGNOSIS — Z681 Body mass index (BMI) 19 or less, adult: Secondary | ICD-10-CM

## 2015-02-01 DIAGNOSIS — Z86718 Personal history of other venous thrombosis and embolism: Secondary | ICD-10-CM

## 2015-02-01 DIAGNOSIS — Z79899 Other long term (current) drug therapy: Secondary | ICD-10-CM | POA: Diagnosis not present

## 2015-02-01 DIAGNOSIS — R627 Adult failure to thrive: Secondary | ICD-10-CM | POA: Diagnosis present

## 2015-02-01 DIAGNOSIS — Z923 Personal history of irradiation: Secondary | ICD-10-CM

## 2015-02-01 DIAGNOSIS — I129 Hypertensive chronic kidney disease with stage 1 through stage 4 chronic kidney disease, or unspecified chronic kidney disease: Secondary | ICD-10-CM | POA: Diagnosis present

## 2015-02-01 DIAGNOSIS — J9600 Acute respiratory failure, unspecified whether with hypoxia or hypercapnia: Secondary | ICD-10-CM | POA: Diagnosis not present

## 2015-02-01 DIAGNOSIS — C01 Malignant neoplasm of base of tongue: Secondary | ICD-10-CM | POA: Diagnosis present

## 2015-02-01 DIAGNOSIS — B977 Papillomavirus as the cause of diseases classified elsewhere: Secondary | ICD-10-CM | POA: Diagnosis present

## 2015-02-01 DIAGNOSIS — E8770 Fluid overload, unspecified: Secondary | ICD-10-CM | POA: Diagnosis present

## 2015-02-01 DIAGNOSIS — E860 Dehydration: Secondary | ICD-10-CM | POA: Diagnosis present

## 2015-02-01 DIAGNOSIS — N189 Chronic kidney disease, unspecified: Secondary | ICD-10-CM | POA: Diagnosis present

## 2015-02-01 DIAGNOSIS — E440 Moderate protein-calorie malnutrition: Secondary | ICD-10-CM | POA: Diagnosis present

## 2015-02-01 DIAGNOSIS — E870 Hyperosmolality and hypernatremia: Secondary | ICD-10-CM | POA: Diagnosis present

## 2015-02-01 DIAGNOSIS — D6959 Other secondary thrombocytopenia: Secondary | ICD-10-CM | POA: Diagnosis present

## 2015-02-01 DIAGNOSIS — I959 Hypotension, unspecified: Secondary | ICD-10-CM | POA: Diagnosis present

## 2015-02-01 DIAGNOSIS — E109 Type 1 diabetes mellitus without complications: Secondary | ICD-10-CM | POA: Diagnosis present

## 2015-02-01 DIAGNOSIS — J9601 Acute respiratory failure with hypoxia: Secondary | ICD-10-CM | POA: Diagnosis not present

## 2015-02-01 DIAGNOSIS — I248 Other forms of acute ischemic heart disease: Secondary | ICD-10-CM | POA: Diagnosis present

## 2015-02-01 DIAGNOSIS — J449 Chronic obstructive pulmonary disease, unspecified: Secondary | ICD-10-CM | POA: Diagnosis present

## 2015-02-01 DIAGNOSIS — I2699 Other pulmonary embolism without acute cor pulmonale: Secondary | ICD-10-CM | POA: Diagnosis not present

## 2015-02-01 DIAGNOSIS — D701 Agranulocytosis secondary to cancer chemotherapy: Secondary | ICD-10-CM | POA: Diagnosis present

## 2015-02-01 DIAGNOSIS — Z515 Encounter for palliative care: Secondary | ICD-10-CM | POA: Diagnosis not present

## 2015-02-01 DIAGNOSIS — Z7982 Long term (current) use of aspirin: Secondary | ICD-10-CM | POA: Diagnosis not present

## 2015-02-01 DIAGNOSIS — C3430 Malignant neoplasm of lower lobe, unspecified bronchus or lung: Secondary | ICD-10-CM

## 2015-02-01 DIAGNOSIS — Z66 Do not resuscitate: Secondary | ICD-10-CM | POA: Diagnosis present

## 2015-02-01 DIAGNOSIS — Z8249 Family history of ischemic heart disease and other diseases of the circulatory system: Secondary | ICD-10-CM

## 2015-02-01 DIAGNOSIS — R0902 Hypoxemia: Secondary | ICD-10-CM

## 2015-02-01 DIAGNOSIS — Z809 Family history of malignant neoplasm, unspecified: Secondary | ICD-10-CM | POA: Diagnosis not present

## 2015-02-01 DIAGNOSIS — I10 Essential (primary) hypertension: Secondary | ICD-10-CM | POA: Diagnosis not present

## 2015-02-01 DIAGNOSIS — C109 Malignant neoplasm of oropharynx, unspecified: Secondary | ICD-10-CM

## 2015-02-01 DIAGNOSIS — Z87891 Personal history of nicotine dependence: Secondary | ICD-10-CM | POA: Diagnosis not present

## 2015-02-01 DIAGNOSIS — R0600 Dyspnea, unspecified: Secondary | ICD-10-CM | POA: Diagnosis not present

## 2015-02-01 DIAGNOSIS — T451X5A Adverse effect of antineoplastic and immunosuppressive drugs, initial encounter: Secondary | ICD-10-CM | POA: Diagnosis present

## 2015-02-01 DIAGNOSIS — R0602 Shortness of breath: Secondary | ICD-10-CM | POA: Diagnosis not present

## 2015-02-01 DIAGNOSIS — N179 Acute kidney failure, unspecified: Secondary | ICD-10-CM

## 2015-02-01 LAB — BASIC METABOLIC PANEL
ANION GAP: 11 (ref 5–15)
BUN: 49 mg/dL — AB (ref 6–23)
CHLORIDE: 117 mmol/L — AB (ref 96–112)
CO2: 21 mmol/L (ref 19–32)
CREATININE: 1.67 mg/dL — AB (ref 0.50–1.35)
Calcium: 8.5 mg/dL (ref 8.4–10.5)
GFR calc Af Amer: 43 mL/min — ABNORMAL LOW (ref >90)
GFR calc non Af Amer: 37 mL/min — ABNORMAL LOW (ref >90)
Glucose, Bld: 102 mg/dL — ABNORMAL HIGH (ref 70–99)
Potassium: 3.9 mmol/L (ref 3.5–5.1)
Sodium: 149 mmol/L — ABNORMAL HIGH (ref 135–145)

## 2015-02-01 LAB — URINALYSIS, ROUTINE W REFLEX MICROSCOPIC
Glucose, UA: NEGATIVE mg/dL
Hgb urine dipstick: NEGATIVE
KETONES UR: 15 mg/dL — AB
Leukocytes, UA: NEGATIVE
NITRITE: NEGATIVE
Protein, ur: 30 mg/dL — AB
Specific Gravity, Urine: 1.021 (ref 1.005–1.030)
UROBILINOGEN UA: 1 mg/dL (ref 0.0–1.0)
pH: 5.5 (ref 5.0–8.0)

## 2015-02-01 LAB — CBC WITH DIFFERENTIAL/PLATELET
BASOS ABS: 0 10*3/uL (ref 0.0–0.1)
Basophils Relative: 0 % (ref 0–1)
EOS ABS: 0 10*3/uL (ref 0.0–0.7)
Eosinophils Relative: 0 % (ref 0–5)
HCT: 36.7 % — ABNORMAL LOW (ref 39.0–52.0)
Hemoglobin: 11.6 g/dL — ABNORMAL LOW (ref 13.0–17.0)
LYMPHS ABS: 0.7 10*3/uL (ref 0.7–4.0)
Lymphocytes Relative: 6 % — ABNORMAL LOW (ref 12–46)
MCH: 29.9 pg (ref 26.0–34.0)
MCHC: 31.6 g/dL (ref 30.0–36.0)
MCV: 94.6 fL (ref 78.0–100.0)
MONOS PCT: 0 % — AB (ref 3–12)
Monocytes Absolute: 0 10*3/uL — ABNORMAL LOW (ref 0.1–1.0)
Neutro Abs: 11.3 10*3/uL — ABNORMAL HIGH (ref 1.7–7.7)
Neutrophils Relative %: 94 % — ABNORMAL HIGH (ref 43–77)
PLATELETS: 142 10*3/uL — AB (ref 150–400)
RBC: 3.88 MIL/uL — ABNORMAL LOW (ref 4.22–5.81)
RDW: 14.9 % (ref 11.5–15.5)
WBC: 12 10*3/uL — ABNORMAL HIGH (ref 4.0–10.5)

## 2015-02-01 LAB — URINE MICROSCOPIC-ADD ON

## 2015-02-01 MED ORDER — SODIUM CHLORIDE 0.45 % IV SOLN
INTRAVENOUS | Status: DC
  Administered 2015-02-01: 23:00:00 via INTRAVENOUS

## 2015-02-01 MED ORDER — ONDANSETRON HCL 4 MG PO TABS: 4.0000 mg | ORAL_TABLET | Freq: Four times a day (QID) | ORAL | Status: DC | PRN

## 2015-02-01 MED ORDER — DONEPEZIL HCL 10 MG PO TABS
10.0000 mg | ORAL_TABLET | Freq: Every day | ORAL | Status: DC
  Administered 2015-02-01 – 2015-02-04 (×4): 10 mg via ORAL
  Filled 2015-02-01 (×5): qty 1

## 2015-02-01 MED ORDER — SODIUM CHLORIDE 0.9 % IV SOLN
INTRAVENOUS | Status: AC
  Administered 2015-02-01: 21:00:00 via INTRAVENOUS

## 2015-02-01 MED ORDER — HYDROCODONE-ACETAMINOPHEN 10-325 MG PO TABS: 1.0000 | ORAL_TABLET | Freq: Four times a day (QID) | ORAL | Status: DC | PRN

## 2015-02-01 MED ORDER — LORATADINE 10 MG PO TABS
10.0000 mg | ORAL_TABLET | Freq: Every day | ORAL | Status: DC
  Administered 2015-02-01 – 2015-02-04 (×4): 10 mg via ORAL
  Filled 2015-02-01 (×5): qty 1

## 2015-02-01 MED ORDER — INSULIN DETEMIR 100 UNIT/ML ~~LOC~~ SOLN
10.0000 [IU] | Freq: Every day | SUBCUTANEOUS | Status: DC
  Administered 2015-02-01: 10 [IU] via SUBCUTANEOUS
  Filled 2015-02-01: qty 0.1

## 2015-02-01 MED ORDER — ASPIRIN EC 81 MG PO TBEC
81.0000 mg | DELAYED_RELEASE_TABLET | Freq: Every day | ORAL | Status: DC
Start: 2015-02-01 — End: 2015-02-06
  Administered 2015-02-01 – 2015-02-04 (×4): 81 mg via ORAL
  Filled 2015-02-01 (×5): qty 1

## 2015-02-01 MED ORDER — ONDANSETRON HCL 4 MG/2ML IJ SOLN: 4.0000 mg | Freq: Three times a day (TID) | INTRAMUSCULAR | Status: DC | PRN

## 2015-02-01 MED ORDER — HYDROMORPHONE HCL 1 MG/ML IJ SOLN
0.5000 mg | INTRAMUSCULAR | Status: DC | PRN
  Administered 2015-02-04 – 2015-02-06 (×3): 0.5 mg via INTRAVENOUS
  Filled 2015-02-01 (×3): qty 1

## 2015-02-01 MED ORDER — CETYLPYRIDINIUM CHLORIDE 0.05 % MT LIQD
7.0000 mL | Freq: Two times a day (BID) | OROMUCOSAL | Status: DC
  Administered 2015-02-02 – 2015-02-06 (×9): 7 mL via OROMUCOSAL

## 2015-02-01 MED ORDER — OXYCODONE HCL 5 MG PO TABS: 5.0000 mg | ORAL_TABLET | ORAL | Status: DC | PRN

## 2015-02-01 MED ORDER — ACETAMINOPHEN 650 MG RE SUPP
650.0000 mg | Freq: Four times a day (QID) | RECTAL | Status: DC | PRN
Start: 2015-02-01 — End: 2015-02-06

## 2015-02-01 MED ORDER — SODIUM CHLORIDE 0.9 % IJ SOLN: 3.0000 mL | Freq: Two times a day (BID) | INTRAMUSCULAR | Status: DC

## 2015-02-01 MED ORDER — SODIUM CHLORIDE 0.9 % IV SOLN
Freq: Once | INTRAVENOUS | Status: AC
  Administered 2015-02-01: 19:00:00 via INTRAVENOUS

## 2015-02-01 MED ORDER — ENOXAPARIN SODIUM 30 MG/0.3ML ~~LOC~~ SOLN
30.0000 mg | SUBCUTANEOUS | Status: DC
Start: 2015-02-01 — End: 2015-02-02
  Administered 2015-02-01: 30 mg via SUBCUTANEOUS
  Filled 2015-02-01 (×2): qty 0.3

## 2015-02-01 MED ORDER — MIRTAZAPINE 15 MG PO TABS
30.0000 mg | ORAL_TABLET | Freq: Every day | ORAL | Status: DC
  Administered 2015-02-01 – 2015-02-04 (×4): 30 mg via ORAL
  Filled 2015-02-01 (×5): qty 1

## 2015-02-01 MED ORDER — ADULT MULTIVITAMIN W/MINERALS CH
1.0000 | ORAL_TABLET | Freq: Every day | ORAL | Status: DC
  Administered 2015-02-02 – 2015-02-04 (×3): 1 via ORAL
  Filled 2015-02-01 (×4): qty 1

## 2015-02-01 MED ORDER — ATORVASTATIN CALCIUM 10 MG PO TABS
10.0000 mg | ORAL_TABLET | ORAL | Status: DC
  Administered 2015-02-02 – 2015-02-04 (×2): 10 mg via ORAL
  Filled 2015-02-01 (×2): qty 1

## 2015-02-01 MED ORDER — CHLORHEXIDINE GLUCONATE 0.12 % MT SOLN
15.0000 mL | Freq: Two times a day (BID) | OROMUCOSAL | Status: DC
  Administered 2015-02-01 – 2015-02-06 (×10): 15 mL via OROMUCOSAL
  Filled 2015-02-01 (×12): qty 15

## 2015-02-01 MED ORDER — BOOST / RESOURCE BREEZE PO LIQD
1.0000 | Freq: Three times a day (TID) | ORAL | Status: DC
  Administered 2015-02-01 – 2015-02-03 (×7): 1 via ORAL

## 2015-02-01 MED ORDER — ALUM & MAG HYDROXIDE-SIMETH 200-200-20 MG/5ML PO SUSP
30.0000 mL | Freq: Four times a day (QID) | ORAL | Status: DC | PRN
Start: 2015-02-01 — End: 2015-02-06

## 2015-02-01 MED ORDER — ONDANSETRON HCL 4 MG/2ML IJ SOLN: 4.0000 mg | Freq: Four times a day (QID) | INTRAMUSCULAR | Status: DC | PRN

## 2015-02-01 MED ORDER — ACETAMINOPHEN 325 MG PO TABS: 650.0000 mg | ORAL_TABLET | Freq: Four times a day (QID) | ORAL | Status: DC | PRN

## 2015-02-01 NOTE — ED Provider Notes (Signed)
CSN: [id number]     Arrival date & time 01/18/2015  1740 History   First MD Initiated Contact with Patient 01/20/2015 1805     Chief Complaint  Patient presents with  . Dehydration  . Anorexia      (Consider location/radiation/quality/duration/timing/severity/associated sxs/prior Treatment) HPI Comments: The patient is a 81 year old male history of lung cancer diagnosed in March 2016, currently getting chemotherapy with carboplatin, and etoposide, Neulasta support as well. His first dose was 01/26/2015. The patient is brought in by his spouse today because of decreased oral intake, severe weakness and inability to get out of bed, on Wednesday Thursday and Friday of last week he was in the oncology office is getting IV fluids for dehydration, he has refused to take anything by mouth and is severely weak requiring maximum support to get back and forth to the bathroom. Today his significant other noticed that he was confused, slightly agitated, says improved since arrival but he still not able to take oral intake, nurse practitioner Berniece Salines at the oncology clinic recommended coming to the hospital for likely admission and rehydration.  The history is provided by the patient.    Past Medical History  Diagnosis Date  . Hyperlipidemia   . AAA (abdominal aortic aneurysm)     status post repair in 2005  . Hx of colonic polyps   . CKD (chronic kidney disease)     last creatinine   . Oropharynx cancer   . Cancer 04/28/10    R base of tongue, inv squamous cell, HPV +  . Diabetes mellitus type I   . Hypertension   . COPD (chronic obstructive pulmonary disease)   . Hx of radiation therapy 06/01/10 to 07/21/10    R base of tongue  . Anemia     mild/chemo-induced  . DVT (deep venous thrombosis)    Past Surgical History  Procedure Laterality Date  . Tonsillectomy    . Colonoscopy w/ polypectomy    . Hemorrhoid surgery    . Inguinal hernia repair    . Penial implant    . Rotator cuff repair    .  Appendectomy    . Abdominal aortic aneurysm repair     Family History  Problem Relation Age of Onset  . Cancer Other   . Stroke Other   . Cancer Mother     unknown type  . Cancer Sister     lung  . Cancer Sister     brain  . Heart disease Father     Before age 73  . Heart attack Father   . Hypertension Father    History  Substance Use Topics  . Smoking status: Former Smoker -- 1.00 packs/day for 50 years    Types: Cigarettes    Quit date: 01/29/2009  . Smokeless tobacco: Never Used  . Alcohol Use: No    Review of Systems  All other systems reviewed and are negative.     Allergies  Review of patient's allergies indicates no known allergies.  Home Medications   Prior to Admission medications   Medication Sig Start Date End Date Taking? Authorizing Provider  acetaminophen (TYLENOL) 325 MG tablet Take 650 mg by mouth every 6 (six) hours as needed for mild pain or headache.    Yes Historical Provider, MD  aspirin EC 81 MG tablet Take 81 mg by mouth daily.   Yes Historical Provider, MD  atorvastatin (LIPITOR) 10 MG tablet Take 1 tablet (10 mg total) by mouth daily. Patient taking  differently: Take 10 mg by mouth every Monday, Wednesday, and Friday.  06/03/14  Yes Dorena Cookey, MD  Coenzyme Q10 (COQ10) 100 MG CAPS Take by mouth daily.     Yes Historical Provider, MD  donepezil (ARICEPT) 10 MG tablet Take 1 tablet (10 mg total) by mouth at bedtime. 09/28/14  Yes Star Age, MD  HYDROcodone-acetaminophen [facility]) 10-325 MG per tablet 1/2-1 tablet twice daily when necessary for pain 12/28/14  Yes Dorena Cookey, MD  insulin aspart (NOVOLOG) 100 UNIT/ML injection INJECT 15 UNITS INTO THE   SKIN 3 TIMES A DAY BEFORE  MEALS 12/28/14  Yes Dorena Cookey, MD  insulin detemir (LEVEMIR) 100 UNIT/ML injection Inject 0.3 mLs (30 Units total) into the skin at bedtime. 12/28/14  Yes Dorena Cookey, MD  loratadine (CLARITIN) 10 MG tablet Take 10 mg by mouth daily.   Yes Historical Provider,  MD  mirtazapine (REMERON) 30 MG tablet Take 1 tablet (30 mg total) by mouth at bedtime. 01/22/15  Yes Curt Bears, MD  Multiple Vitamin (MULTIVITAMIN PO) Take by mouth daily.     Yes Historical Provider, MD  [place]   Yes Historical Provider, MD  prochlorperazine (COMPAZINE) 10 MG tablet Take 1 tablet (10 mg total) by mouth every 6 (six) hours as needed for nausea or vomiting. 01/22/15  Yes Curt Bears, MD  TURMERIC PO Take 800 mg by mouth at bedtime.   Yes Historical Provider, MD  glucose blood (ONETOUCH VERIO) test strip Test twice daily, dx 250.01 Patient not taking: Reported on 01/27/2015 01/18/14   Dorena Cookey, MD   BP 148/78 mmHg  Pulse 105  Temp(Src) 98.9 F (37.2 C) (Oral)  Resp 23  SpO2 90% Physical Exam  Constitutional: He appears well-developed and well-nourished. No distress.  HENT:  Head: Normocephalic and atraumatic.  Mouth/Throat: No oropharyngeal exudate.  Severely dehydrated mucous membranes  Eyes: Conjunctivae and EOM are normal. Pupils are equal, round, and reactive to light. Right eye exhibits no discharge. Left eye exhibits no discharge. No scleral icterus.  Neck: Normal range of motion. Neck supple. No JVD present. No thyromegaly present.  Cardiovascular: Regular rhythm, normal heart sounds and intact distal pulses.  Exam reveals no gallop and no friction rub.   No murmur heard. Mild tachycardia  Pulmonary/Chest: Effort normal and breath sounds normal. No respiratory distress. He has no wheezes. He has no rales.  Abdominal: Soft. Bowel sounds are normal. He exhibits no distension and no mass. There is no tenderness.  Musculoskeletal: Normal range of motion. He exhibits no edema or tenderness.  Lymphadenopathy:    He has no cervical adenopathy.  Neurological: He is alert. Coordination normal.  Skin: Skin is warm and dry. No rash noted. No erythema.  Psychiatric: He has a normal mood and affect. His behavior is normal.  Nursing note  and vitals reviewed.   ED Course  Procedures (including critical care time) Labs Review Labs Reviewed  BASIC METABOLIC PANEL - Abnormal; Notable for the following:    Sodium 149 (*)    Chloride 117 (*)    Glucose, Bld 102 (*)    BUN 49 (*)    Creatinine, Ser 1.67 (*)    GFR calc non Af Amer 37 (*)    GFR calc Af Amer 43 (*)    All other components within normal limits  CBC WITH DIFFERENTIAL/PLATELET - Abnormal; Notable for the following:    WBC 12.0 (*)    RBC 3.88 (*)  Hemoglobin 11.6 (*)    HCT 36.7 (*)    Platelets 142 (*)    All other components within normal limits  URINALYSIS, ROUTINE W REFLEX MICROSCOPIC    Imaging Review No results found.    MDM   Final diagnoses:  Hypernatremia  Dehydration    The patient had a large amount of stool and urine incontinence on arrival, his vital signs are significant only for mild tachycardia, he is following commands, speaking, does not appear to be confused at this time, he does appear very dehydrated and likely needs admission for hydration. Labs pending  Labs show hypernatremia, dehydration with uremia, persistently mildly tachycardic, otherwise does not appear toxic, he will need IV hydration and admission to the hospital, discussed with Dr. Arnoldo Morale of the hospitalist service will admit.  Noemi Chapel, MD 01/29/2015 2000

## 2015-02-01 NOTE — H&P (Signed)
[facility] Admission History and Physical       SENDER RUEB EVO:[id number] DOB: [birth year] DOA: 02/07/2015  Referring physician: EDP PCP: Joycelyn Man, MD  Specialists:   Chief Complaint: Poor Intake of foods and liquids  HPI: Dustin Vance is a 81 y.o. male with a history of Lung Cancer diagnosed 12/2014 who had his first round of chemotherapy on 04/13, 04/14, and 04/15 who was brought tot he ED due to poor appetite and no intake of foods or liquids for the past 5 days.    He had a Neulasta injection yesterday.    He has had increased weakness and could not get out of bed today and was advised to go to the ED.    He denies any fevers or chills or nausea or vomiting, but his family report that he has had loose stools.     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        Review of Systems:  Constitutional:  +Weight Loss, No Weight Gain, Night Sweats, Fevers, Chills, Dizziness, Light Headedness, +Fatigue, +Generalized Weakness HEENT: No Headaches, Difficulty Swallowing,Tooth/Dental Problems,Sore Throat,  No Sneezing, Rhinitis, Ear Ache, Nasal Congestion, or Post Nasal Drip,  Cardio-vascular:  No Chest pain, Orthopnea, PND, Edema in Lower Extremities, Anasarca, Dizziness, Palpitations  Resp: No Dyspnea, No DOE, No Productive Cough, No Non-Productive Cough, No Hemoptysis, No Wheezing.    GI: No Heartburn, Indigestion, Abdominal Pain, Nausea, Vomiting, Diarrhea, Constipation, Hematemesis, Hematochezia, Melena, Change in Bowel Habits,  +Loss of Appetite  GU: No Dysuria, No Change in Color of Urine, No Urgency or Urinary Frequency, No Flank pain.  Musculoskeletal: No Joint Pain or Swelling, No Decreased Range of Motion, No Back Pain.  Neurologic: No Syncope, No Seizures, Muscle Weakness, Paresthesia, Vision Disturbance or Loss, No Diplopia, No Vertigo, No Difficulty  Walking,  Skin: No Rash or Lesions. Psych: No Change in Mood or Affect, No Depression or Anxiety, No Memory loss, No Confusion, or Hallucinations   Past Medical History  Diagnosis Date  . Hyperlipidemia   . AAA (abdominal aortic aneurysm)     status post repair in 2005  . Hx of colonic polyps   . CKD (chronic kidney disease)     last creatinine   . Oropharynx cancer   . Cancer 04/28/10    R base of tongue, inv squamous cell, HPV +  . Diabetes mellitus type I   . Hypertension   . COPD (chronic obstructive pulmonary disease)   . Hx of radiation therapy 06/01/10 to 07/21/10    R base of tongue  . Anemia     mild/chemo-induced  . DVT (deep venous thrombosis)      Past Surgical History  Procedure Laterality Date  . Tonsillectomy    . Colonoscopy w/ polypectomy    . Hemorrhoid surgery    . Inguinal hernia repair    . Penial implant    . Rotator cuff repair    . Appendectomy    . Abdominal aortic aneurysm repair        Prior to Admission medications   Medication Sig Start Date End Date Taking? Authorizing Provider  acetaminophen (TYLENOL) 325 MG tablet Take 650 mg by mouth every 6 (six) hours as needed for mild pain or headache.    Yes Historical Provider, MD  aspirin EC 81 MG tablet Take 81 mg by mouth  daily.   Yes Historical Provider, MD  atorvastatin (LIPITOR) 10 MG tablet Take 1 tablet (10 mg total) by mouth daily. Patient taking differently: Take 10 mg by mouth every Monday, Wednesday, and Friday.  06/03/14  Yes Dorena Cookey, MD  Coenzyme Q10 (COQ10) 100 MG CAPS Take by mouth daily.     Yes Historical Provider, MD  donepezil (ARICEPT) 10 MG tablet Take 1 tablet (10 mg total) by mouth at bedtime. 09/28/14  Yes Star Age, MD  HYDROcodone-acetaminophen [facility]) 10-325 MG per tablet 1/2-1 tablet twice daily when necessary for pain 12/28/14  Yes Dorena Cookey, MD  insulin aspart (NOVOLOG) 100 UNIT/ML injection INJECT 15 UNITS INTO THE   SKIN 3 TIMES A DAY BEFORE  MEALS  12/28/14  Yes Dorena Cookey, MD  insulin detemir (LEVEMIR) 100 UNIT/ML injection Inject 0.3 mLs (30 Units total) into the skin at bedtime. 12/28/14  Yes Dorena Cookey, MD  loratadine (CLARITIN) 10 MG tablet Take 10 mg by mouth daily.   Yes Historical Provider, MD  mirtazapine (REMERON) 30 MG tablet Take 1 tablet (30 mg total) by mouth at bedtime. 01/22/15  Yes Curt Bears, MD  Multiple Vitamin (MULTIVITAMIN PO) Take by mouth daily.     Yes Historical Provider, MD  [place]   Yes Historical Provider, MD  prochlorperazine (COMPAZINE) 10 MG tablet Take 1 tablet (10 mg total) by mouth every 6 (six) hours as needed for nausea or vomiting. 01/22/15  Yes Curt Bears, MD  TURMERIC PO Take 800 mg by mouth at bedtime.   Yes Historical Provider, MD  glucose blood (ONETOUCH VERIO) test strip Test twice daily, dx 250.01 Patient not taking: Reported on 01/15/2015 01/18/14   Dorena Cookey, MD     No Known Allergies  Social History:  reports that he quit smoking about 6 years ago. His smoking use included Cigarettes. He has a 50 pack-year smoking history. He has never used smokeless tobacco. He reports that he does not drink alcohol or use illicit drugs.    Family History  Problem Relation Age of Onset  . Cancer Other   . Stroke Other   . Cancer Mother     unknown type  . Cancer Sister     lung  . Cancer Sister     brain  . Heart disease Father     Before age 34  . Heart attack Father   . Hypertension Father        Physical Exam:  GEN:  Pleasant Cachectic and ill Appearing   81 y.o. Caucasian male examined and in no acute distress; cooperative with exam Filed Vitals:   02/11/2015 1743 01/31/2015 1936  BP: 172/83 148/78  Pulse: 101 105  Temp: 98.9 F (37.2 C)   TempSrc: Oral   Resp: 22 23  SpO2: 98% 90%   Blood pressure 148/78, pulse 105, temperature 98.9 F (37.2 C), temperature source Oral, resp. rate 23, SpO2 90 %. PSYCH: He is alert and oriented x4; does  not appear anxious does not appear depressed; affect is normal HEENT: Normocephalic and Atraumatic, Mucous membranes pink; PERRLA; EOM intact; Fundi:  Benign;  No scleral icterus, Nares: Patent, Oropharynx: Clear, Fair Dentition,    Neck:  FROM, No Cervical Lymphadenopathy nor Thyromegaly or Carotid Bruit; No JVD; Breasts:: Not examined CHEST WALL: No tenderness CHEST: Normal respiration, clear to auscultation bilaterally HEART:  Tachycardic and Regular rate  no murmurs rubs or gallops BACK: No kyphosis or scoliosis; No CVA  tenderness ABDOMEN: Positive Bowel Sounds, Scaphoid, Soft Non-Tender, No Rebound or Guarding; No Masses, No Organomegaly. Rectal Exam: Not done EXTREMITIES: No Cyanosis, Clubbing, or Edema; No Ulcerations. Genitalia: not examined PULSES: 2+ and symmetric SKIN: Normal hydration no rash or ulceration CNS:  Alert and Oriented x 4, No Focal Deficits Vascular: pulses palpable throughout    Labs on Admission:  Basic Metabolic Panel:  Recent Labs Lab 01/26/15 1144 01/31/15 1052 01/23/2015 1839  NA 142 141 149*  K 4.1 4.7 3.9  CL  --   --  117*  CO2 23 13* 21  GLUCOSE 109 399* 102*  BUN 32.0* 36.2* 49*  CREATININE 1.4* 1.6* 1.67*  CALCIUM 9.3 9.2 8.5   Liver Function Tests:  Recent Labs Lab 01/26/15 1144 01/31/15 1052  AST 75* 43*  ALT 101* 53  ALKPHOS 308* 221*  BILITOT 0.52 0.48  PROT 7.2 7.2  ALBUMIN 2.7* 2.7*   No results for input(s): LIPASE, AMYLASE in the last 168 hours. No results for input(s): AMMONIA in the last 168 hours. CBC:  Recent Labs Lab 01/26/15 1143 01/31/15 1052 02/02/2015 1839  WBC 8.2 8.0 12.0*  NEUTROABS 6.2 7.2* 11.3*  HGB 12.1* 12.1* 11.6*  HCT 38.1* 38.0* 36.7*  MCV 92.9 94.8 94.6  PLT 271 204 142*   Cardiac Enzymes: No results for input(s): CKTOTAL, CKMB, CKMBINDEX, TROPONINI in the last 168 hours.  BNP (last 3 results) No results for input(s): BNP in the last 8760 hours.  ProBNP (last 3 results) No results for  input(s): PROBNP in the last 8760 hours.  CBG: No results for input(s): GLUCAP in the last 168 hours.  Radiological Exams on Admission: No results found.   EKG: Independently reviewed. Normal Sinus Rhythm  Rate 94,  Old Inferior and Anterior infarct Changes   Assessment/Plan:   81 y.o. male with  Principal Problem:   1.    Dehydration/AKI (acute kidney injury)- due to Poor Intake   IVFs   Monitor BUN/Cr   Active Problems:   2.   Adult failure to thrive syndrome- Due to Chemotherapy and Disease Process ( Lung Cancer)   Nutrition Consult for needs     3.   Hypernatremia- due to Dehydration   IVFs with 1/2 NSS   Monitor Na+ Trend     4.    Lung cancer   On Chemotherapy   Notify Oncology Dr Earlie Server in AM     5.   Essential hypertension   Monitor BPs       6.   COPD (chronic obstructive pulmonary disease)   DuoNebs PRN     7.    IDDM-   DM Type 1   Reduced dose of levemir to 10 units   SSI coverage PRN   Check HbA1C    8.   Oropharynx cancer   Hx     9.   DVT Prophylaxis    Lovenox          Code Status:     FULL CODE     Family Communication:   Family at Bedside     Disposition Plan:    Inpatient Status        Time spent:  [facility] Pager [phone]   If [place] Please Contact the Day Rounding Team MD for [facility]  If 7PM-7AM, Please Contact Night-Floor Coverage  [email or web address] Password [email or web address] 01/31/2015, 9:18 PM     ADDENDUM:   Patient was  seen and examined on 01/25/2015

## 2015-02-01 NOTE — Progress Notes (Signed)
Utilization Review completed.  Ara Grandmaison RN CM  

## 2015-02-01 NOTE — ED Notes (Signed)
Bed: ZM62 Expected date:  Expected time:  Means of arrival:  Comments: EMS-cancer patient, not able to eat

## 2015-02-01 NOTE — Telephone Encounter (Signed)
@   1600 received call back from pt's wife. She has talked to her adult chi.ldren and the patient and all are in agreement to go to [facility] ED via ambulance.  She expects they will get there at approx. 5 pm or so.

## 2015-02-01 NOTE — Telephone Encounter (Signed)
TC from pt's wife [name]. She states that pt was here yesterday to get IV fluids. When he got home he went straight to bed and now continues to feel poorly, not eating or drinking. He was assisted to bathroom to void with max assist x2. Last time he had to void he could not get out of bed. Wife wants advice on what to do. She does not think she can get him here for more fluids tomorrow as scheduled. She is asking if [place] is the answer. Pt does not have central IV access. Informed wife that home health would not be the answer at this time d/t IV access issues. Informed wife I would discuss with Selena Lesser, NP who saw him yesterday.   Discussed with Selena Lesser, NP. Essentially if patient cannot eat or drink or get out of bed, that he should go to ED with idea of being admitted to hospital for re-hydration. When improved, arrangements could be made for home health if the need is there.  Call back to pt's wife and explained that he has failed outpatient therapy and if he cannot eat or drink or get out of bed , he then needs to go to ED for evaluation and possible admission for hydration needs.  Wife states she will talk to him and then call this nurse back with their decision.

## 2015-02-01 NOTE — ED Notes (Addendum)
Pt from home with Hx of lung cancer brought by EMS for anorexia and inability to drink fluid since last Thursday, pt was seen at oncology yesterday and received fluids. Pt denies n/v/diarrhea.

## 2015-02-02 ENCOUNTER — Ambulatory Visit: Payer: Medicare HMO

## 2015-02-02 ENCOUNTER — Other Ambulatory Visit: Payer: Medicare HMO

## 2015-02-02 ENCOUNTER — Ambulatory Visit: Payer: Medicare HMO | Admitting: Physician Assistant

## 2015-02-02 ENCOUNTER — Encounter: Payer: Self-pay | Admitting: Nurse Practitioner

## 2015-02-02 DIAGNOSIS — R740 Nonspecific elevation of levels of transaminase and lactic acid dehydrogenase [LDH]: Secondary | ICD-10-CM

## 2015-02-02 DIAGNOSIS — N289 Disorder of kidney and ureter, unspecified: Secondary | ICD-10-CM | POA: Insufficient documentation

## 2015-02-02 DIAGNOSIS — R7401 Elevation of levels of liver transaminase levels: Secondary | ICD-10-CM | POA: Insufficient documentation

## 2015-02-02 DIAGNOSIS — E8809 Other disorders of plasma-protein metabolism, not elsewhere classified: Secondary | ICD-10-CM | POA: Insufficient documentation

## 2015-02-02 DIAGNOSIS — G893 Neoplasm related pain (acute) (chronic): Secondary | ICD-10-CM | POA: Insufficient documentation

## 2015-02-02 DIAGNOSIS — R630 Anorexia: Secondary | ICD-10-CM | POA: Insufficient documentation

## 2015-02-02 DIAGNOSIS — R1310 Dysphagia, unspecified: Secondary | ICD-10-CM | POA: Insufficient documentation

## 2015-02-02 DIAGNOSIS — R531 Weakness: Secondary | ICD-10-CM | POA: Insufficient documentation

## 2015-02-02 LAB — BASIC METABOLIC PANEL
ANION GAP: 8 (ref 5–15)
BUN: 46 mg/dL — ABNORMAL HIGH (ref 6–23)
CO2: 21 mmol/L (ref 19–32)
Calcium: 8.2 mg/dL — ABNORMAL LOW (ref 8.4–10.5)
Chloride: 120 mmol/L — ABNORMAL HIGH (ref 96–112)
Creatinine, Ser: 1.46 mg/dL — ABNORMAL HIGH (ref 0.50–1.35)
GFR calc Af Amer: 51 mL/min — ABNORMAL LOW (ref >90)
GFR calc non Af Amer: 44 mL/min — ABNORMAL LOW (ref >90)
Glucose, Bld: 235 mg/dL — ABNORMAL HIGH (ref 70–99)
POTASSIUM: 3.8 mmol/L (ref 3.5–5.1)
SODIUM: 149 mmol/L — AB (ref 135–145)

## 2015-02-02 LAB — CBC
HEMATOCRIT: 34.4 % — AB (ref 39.0–52.0)
HEMOGLOBIN: 10.9 g/dL — AB (ref 13.0–17.0)
MCH: 30.3 pg (ref 26.0–34.0)
MCHC: 31.7 g/dL (ref 30.0–36.0)
MCV: 95.6 fL (ref 78.0–100.0)
Platelets: 106 10*3/uL — ABNORMAL LOW (ref 150–400)
RBC: 3.6 MIL/uL — ABNORMAL LOW (ref 4.22–5.81)
RDW: 15.1 % (ref 11.5–15.5)
WBC: 6.7 10*3/uL (ref 4.0–10.5)

## 2015-02-02 LAB — URINE CULTURE

## 2015-02-02 MED ORDER — ENOXAPARIN SODIUM 40 MG/0.4ML ~~LOC~~ SOLN
40.0000 mg | SUBCUTANEOUS | Status: DC
  Administered 2015-02-02 – 2015-02-04 (×3): 40 mg via SUBCUTANEOUS
  Filled 2015-02-02 (×4): qty 0.4

## 2015-02-02 MED ORDER — DEXTROSE 5 % IV SOLN
INTRAVENOUS | Status: DC
  Administered 2015-02-02 (×2): via INTRAVENOUS

## 2015-02-02 MED ORDER — INSULIN ASPART 100 UNIT/ML ~~LOC~~ SOLN
0.0000 [IU] | Freq: Three times a day (TID) | SUBCUTANEOUS | Status: DC
Start: 2015-02-02 — End: 2015-02-05
  Administered 2015-02-02: 2 [IU] via SUBCUTANEOUS
  Administered 2015-02-03: 9 [IU] via SUBCUTANEOUS
  Administered 2015-02-03: 7 [IU] via SUBCUTANEOUS
  Administered 2015-02-04: 2 [IU] via SUBCUTANEOUS
  Administered 2015-02-04: 9 [IU] via SUBCUTANEOUS

## 2015-02-02 NOTE — Assessment & Plan Note (Signed)
Patient has history of some mild, chronic renal insufficiency.  Creatinine has increased from 1.3 up to 1.4 today.  Most likely, this is secondary to patient's dehydration.  Hopefully, receiving IV fluid rehydration will improve patient's creatinine.  Will continue to monitor closely.

## 2015-02-02 NOTE — Assessment & Plan Note (Signed)
Patient does appear fatigued and very weak today.  He also appears dehydrated today.  Most likely, this is a chemotherapy side effect.  Patient was encouraged to push fluids at home is much as possible.  Also, patient will receive IV fluid rehydration today, this coming Wednesday and Friday as well.

## 2015-02-02 NOTE — Progress Notes (Signed)
Inpatient Diabetes Program Recommendations  AACE/ADA: New Consensus Statement on Inpatient Glycemic Control (2013)  Target Ranges:  Prepandial:   less than 140 mg/dL      Peak postprandial:   less than 180 mg/dL (1-2 hours)      Critically ill patients:  140 - 180 mg/dL   Reason for Visit: Hyperglycemia  Diabetes history: DM2 Outpatient Diabetes medications: Levemir 10 units QHS, Novolog 15 units tidwc Current orders for Inpatient glycemic control: Levemir 10 units QHS  Results for CARLOUS, Dustin Vance (MRN [id number]) as of 02/02/2015 12:13  Ref. Range 01/31/2015 10:52 01/31/2015 18:39 02/02/2015 03:55  Glucose Latest Ref Range: 70-99 mg/dL 399 (H) 102 (H) 235 (H)   Would benefit from adding correction insulin. Will need meal coverage insulin when po intake improves.  Recommendations: Add Novolog sensitive tidwc.  Will continue to follow. Thank you. Lorenda Peck, RD, LDN, CDE Inpatient Diabetes Coordinator [phone]

## 2015-02-02 NOTE — Assessment & Plan Note (Signed)
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  Patient takes oxycodone on an as-needed basis to manage his pain.

## 2015-02-02 NOTE — Assessment & Plan Note (Signed)
Albumin has decreased from 2.8 down to 2.7.  Patient was encouraged to push protein in his diet is much as possible.

## 2015-02-02 NOTE — Assessment & Plan Note (Signed)
Patient admits to very poor oral intake for the past several days.  He does appear very dehydrated today.  Patient will receive 1 L normal saline IV fluid rehydration today.  He will also return this coming Wednesday and Friday to receive additional IV fluid rehydration.  Was also encouraged to push fluids at home is much as possible.

## 2015-02-02 NOTE — Progress Notes (Signed)
[facility] PROGRESS NOTE  Dustin Vance XVQ:[id number] DOB: 07-06-1933 DOA: 01/18/2015 PCP: Joycelyn Man, MD  Assessment/Plan: 1. Hypernatremia- sodium is still 149, will start D5W at 100 mL per hour. Check sodium in the a.m. 2. Lethargy, failure to thrive- due to chemotherapy and lung cancer. Nutrition consulted for nutritional assessment. 3. Lung cancer- patient currently on chemotherapy, called and discussed with oncology. Will continue with IV hydration. 4. Diabetes mellitus- will hold Levemir at this time, initiate sliding scale insulin with NovoLog 5. COPD- stable, no shortness of breath. Continue duo nebs when necessary 6. DVT prophylaxis- Lovenox  Code Status: Full code Family Communication: No family at bedside Disposition Plan: To be decided   Consultants:  None  Procedures:  None  Antibiotics:  None  HPI/Subjective: 81 year old male with a history of lung cancer diagnosed March 2016 had first round on chemotherapy on April 13, 14, 15. Patient was brought to the ED for or appetite and poor by mouth intake of fluids. Patient received Neulasta 2 days ago. Patient found to be hypernatremic with  Dehydration and started on IV fluids. This morning patient continues to be lethargic, though arousable to verbal stimuli.  Objective: Filed Vitals:   02/02/15 0507  BP: 160/88  Pulse: 83  Temp: 97.6 F (36.4 C)  Resp: 20    Intake/Output Summary (Last 24 hours) at 02/02/15 1248 Last data filed at 02/02/15 1032  Gross per 24 hour  Intake 1033.33 ml  Output    700 ml  Net 333.33 ml   Filed Weights   02/04/2015 2119  Weight: 61.3 kg (135 lb 2.3 oz)    Exam:   General:  Appears lethargic, arousable to verbal stimuli  Cardiovascular: S1-S2 is regular  Respiratory: Clear to auscultation bilaterally  Abdomen: Soft, nontender, no organomegaly  Musculoskeletal: No edema noted in the lower extremities   Data Reviewed: Basic Metabolic  Panel:  Recent Labs Lab 01/31/15 1052 01/17/2015 1839 02/02/15 0355  NA 141 149* 149*  K 4.7 3.9 3.8  CL  --  117* 120*  CO2 13* 21 21  GLUCOSE 399* 102* 235*  BUN 36.2* 49* 46*  CREATININE 1.6* 1.67* 1.46*  CALCIUM 9.2 8.5 8.2*   Liver Function Tests:  Recent Labs Lab 01/31/15 1052  AST 43*  ALT 53  ALKPHOS 221*  BILITOT 0.48  PROT 7.2  ALBUMIN 2.7*   No results for input(s): LIPASE, AMYLASE in the last 168 hours. No results for input(s): AMMONIA in the last 168 hours. CBC:  Recent Labs Lab 01/31/15 1052 02/05/2015 1839 02/02/15 0355  WBC 8.0 12.0* 6.7  NEUTROABS 7.2* 11.3*  --   HGB 12.1* 11.6* 10.9*  HCT 38.0* 36.7* 34.4*  MCV 94.8 94.6 95.6  PLT 204 142* 106*   Cardiac Enzymes: No results for input(s): CKTOTAL, CKMB, CKMBINDEX, TROPONINI in the last 168 hours. BNP (last 3 results) No results for input(s): BNP in the last 8760 hours.  ProBNP (last 3 results) No results for input(s): PROBNP in the last 8760 hours.  CBG: No results for input(s): GLUCAP in the last 168 hours.  Recent Results (from the past 240 hour(s))  Urine Culture     Status: None   Collection Time: 01/31/15 10:53 AM  Result Value Ref Range Status   Urine Culture, Routine Culture, Urine  Final    Comment: Final - ===== COLONY COUNT: ===== 9,000 COLONIES/ML Insignificant Growth      Studies: No results found.  Scheduled Meds: . antiseptic oral rinse  7 mL Mouth Rinse q12n4p  . aspirin EC  81 mg Oral Daily  . atorvastatin  10 mg Oral Q M,W,F  . chlorhexidine  15 mL Mouth Rinse BID  . donepezil  10 mg Oral QHS  . enoxaparin (LOVENOX) injection  40 mg Subcutaneous Q24H  . feeding supplement (RESOURCE BREEZE)  1 Container Oral TID BM  . insulin detemir  10 Units Subcutaneous QHS  . loratadine  10 mg Oral Daily  . mirtazapine  30 mg Oral QHS  . multivitamin with minerals  1 tablet Oral Daily  . sodium chloride  3 mL Intravenous Q12H   Continuous Infusions: . sodium chloride  100 mL/hr at 02/12/2015 2255    Principal Problem:   Dehydration Active Problems:   Essential hypertension   COPD (chronic obstructive pulmonary disease)   Oropharynx cancer   Lung cancer   AKI (acute kidney injury)   Adult failure to thrive syndrome   Hypernatremia    Time spent: 25 min    [facility]  [facility] Pager [phone]*. If 7PM-7AM, please contact night-coverage at [email or web address], password [facility] 02/02/2015, 12:48 PM  LOS: 1 day

## 2015-02-02 NOTE — Progress Notes (Signed)
SYMPTOM MANAGEMENT CLINIC   HPI: Dustin Vance 81 y.o. male diagnosed with lung cancer; with metastasis to the liver and bone.  Patient is status post concurrent chemotherapy and radiation treatments in the past.  Currently undergoing carboplatin/etoposide chemotherapy regimen.   Patient is complaining of minimal appetite and very poor oral intake recently.  He feels very dehydrated today.  He is complaining of increased fatigue and generalized weakness as well.  He is also complaining of some dysphasia secondary to his previously diagnosed tonsillar cancer.  He states that he manages his pain with oxycodone he R he has at home.  Patient denies any recent fevers or chills.  HPI  ROS  Past Medical History  Diagnosis Date  . Hyperlipidemia   . AAA (abdominal aortic aneurysm)     status post repair in 2005  . Hx of colonic polyps   . CKD (chronic kidney disease)     last creatinine   . Oropharynx cancer   . Cancer 04/28/10    R base of tongue, inv squamous cell, HPV +  . Diabetes mellitus type I   . Hypertension   . COPD (chronic obstructive pulmonary disease)   . Hx of radiation therapy 06/01/10 to 07/21/10    R base of tongue  . Anemia     mild/chemo-induced  . DVT (deep venous thrombosis)     Past Surgical History  Procedure Laterality Date  . Tonsillectomy    . Colonoscopy w/ polypectomy    . Hemorrhoid surgery    . Inguinal hernia repair    . Penial implant    . Rotator cuff repair    . Appendectomy    . Abdominal aortic aneurysm repair      has HYPERLIPIDEMIA; Essential hypertension; HIP PAIN, LEFT; DYSPNEA; COLONIC POLYPS, HX OF; MEMORY LOSS; COPD (chronic obstructive pulmonary disease); Oropharynx cancer; Diabetes mellitus type I; Hx of radiation therapy; Aneurysm of artery of lower extremity; Malignant neoplasm of lingual tonsil; Episodic low back pain; Acute deep vein thrombosis (DVT) of popliteal vein of left lower extremity; Thoracic back pain; Chest  pain; Pain of right upper extremity-Trunk; Lung cancer; Liver lesion; Dehydration; AKI (acute kidney injury); Adult failure to thrive syndrome; Hypernatremia; Weakness; Cancer associated pain; Renal insufficiency; Hyperphosphatemia; Transaminitis; Hypoalbuminemia; Anorexia; and Dysphagia on his problem list.    has No Known Allergies.    Medication List       This list is accurate as of: 01/31/15 11:59 PM.  Always use your most recent med list.               acetaminophen 325 MG tablet  Commonly known as:  TYLENOL  Take 650 mg by mouth every 6 (six) hours as needed for mild pain or headache.     aspirin EC 81 MG tablet  Take 81 mg by mouth daily.     atorvastatin 10 MG tablet  Commonly known as:  LIPITOR  Take 1 tablet (10 mg total) by mouth daily.     CoQ10 100 MG Caps  Take by mouth daily.     donepezil 10 MG tablet  Commonly known as:  ARICEPT  Take 1 tablet (10 mg total) by mouth at bedtime.     glucose blood test strip  Commonly known as:  ONETOUCH VERIO  Test twice daily, dx 250.01     HYDROcodone-acetaminophen 10-325 MG per tablet  Commonly known as:  NORCO  1/2-1 tablet twice daily when necessary for pain  insulin aspart 100 UNIT/ML injection  Commonly known as:  NOVOLOG  INJECT 15 UNITS INTO THE   SKIN 3 TIMES A DAY BEFORE  MEALS     insulin detemir 100 UNIT/ML injection  Commonly known as:  LEVEMIR  Inject 0.3 mLs (30 Units total) into the skin at bedtime.     mirtazapine 30 MG tablet  Commonly known as:  REMERON  Take 1 tablet (30 mg total) by mouth at bedtime.     MULTIVITAMIN PO  Take by mouth daily.     ONETOUCH DELICA LANCETS FINE Misc  Test twice daily, dx 250.01     prochlorperazine 10 MG tablet  Commonly known as:  COMPAZINE  Take 1 tablet (10 mg total) by mouth every 6 (six) hours as needed for nausea or vomiting.     TURMERIC PO  Take 800 mg by mouth at bedtime.         PHYSICAL EXAMINATION  Oncology Vitals 02/02/2015  01/21/2015 01/31/2015 01/16/2015 01/31/2015 01/31/2015 01/28/2015  Height - 180 cm - - - - -  Weight - 61.3 kg - - - - -  Weight (lbs) - 135 lbs 2 oz - - - - -  BMI (kg/m2) - 18.85 kg/m2 - - - - -  Temp 97.6 98.4 - 98.9 - 97.6 -  Pulse 83 88 105 101 110 105 84  Resp 20 20 23 22 18 18 16   Resp (Historical as of 05/15/12) - - - - - - -  SpO2 98 98 90 98 - 96 -  BSA (m2) - 1.75 m2 - - - - -   BP Readings from Last 3 Encounters:  02/02/15 160/88  01/31/15 144/64  01/31/15 122/59    Physical Exam  Constitutional: He is oriented to person, place, and time.  Patient appears fatigued, weak, frail, and chronically ill.  HENT:  Head: Normocephalic and atraumatic.  Mouth/Throat: Oropharynx is clear and moist.  Eyes: Conjunctivae and EOM are normal. Pupils are equal, round, and reactive to light. Right eye exhibits no discharge. Left eye exhibits no discharge. No scleral icterus.  Neck: Normal range of motion. Neck supple. No JVD present. No tracheal deviation present. No thyromegaly present.  Cardiovascular: Normal rate, regular rhythm, normal heart sounds and intact distal pulses.   Pulmonary/Chest: Effort normal and breath sounds normal. No respiratory distress. He has no wheezes. He has no rales. He exhibits no tenderness.  Abdominal: Soft. Bowel sounds are normal. He exhibits no distension and no mass. There is no tenderness. There is no rebound and no guarding.  Musculoskeletal: Normal range of motion. He exhibits no edema or tenderness.  Lymphadenopathy:    He has no cervical adenopathy.  Neurological: He is alert and oriented to person, place, and time.  Skin: Skin is warm and dry. No rash noted. No erythema. There is pallor.  Psychiatric: Affect normal.  Nursing note and vitals reviewed.   LABORATORY DATA:. Appointment on 01/31/2015  Component Date Value Ref Range Status  . WBC 01/31/2015 8.0  4.0 - 10.3 10e3/uL Final  . NEUT# 01/31/2015 7.2* 1.5 - 6.5 10e3/uL Final  . HGB 01/31/2015  12.1* 13.0 - 17.1 g/dL Final  . HCT 01/31/2015 38.0* 38.4 - 49.9 % Final  . Platelets 01/31/2015 204  140 - 400 10e3/uL Final  . MCV 01/31/2015 94.8  79.3 - 98.0 fL Final  . MCH 01/31/2015 30.3  27.2 - 33.4 pg Final  . MCHC 01/31/2015 32.0  32.0 - 36.0 g/dL Final  .  RBC 01/31/2015 4.01* 4.20 - 5.82 10e6/uL Final  . RDW 01/31/2015 15.5* 11.0 - 14.6 % Final  . lymph# 01/31/2015 0.7* 0.9 - 3.3 10e3/uL Final  . MONO# 01/31/2015 0.0* 0.1 - 0.9 10e3/uL Final  . Eosinophils Absolute 01/31/2015 0.1  0.0 - 0.5 10e3/uL Final  . Basophils Absolute 01/31/2015 0.0  0.0 - 0.1 10e3/uL Final  . NEUT% 01/31/2015 89.6* 39.0 - 75.0 % Final  . LYMPH% 01/31/2015 8.9* 14.0 - 49.0 % Final  . MONO% 01/31/2015 0.6  0.0 - 14.0 % Final  . EOS% 01/31/2015 0.7  0.0 - 7.0 % Final  . BASO% 01/31/2015 0.2  0.0 - 2.0 % Final  . Sodium 01/31/2015 141  136 - 145 mEq/L Final  . Potassium 01/31/2015 4.7  3.5 - 5.1 mEq/L Final  . Chloride 01/31/2015 108  98 - 109 mEq/L Final  . CO2 01/31/2015 13* 22 - 29 mEq/L Final  . Glucose 01/31/2015 399* 70 - 140 mg/dl Final  . BUN 01/31/2015 36.2* 7.0 - 26.0 mg/dL Final  . Creatinine 01/31/2015 1.6* 0.7 - 1.3 mg/dL Final  . Total Bilirubin 01/31/2015 0.48  0.20 - 1.20 mg/dL Final  . Alkaline Phosphatase 01/31/2015 221* 40 - 150 U/L Final  . AST 01/31/2015 43* 5 - 34 U/L Final  . ALT 01/31/2015 53  0 - 55 U/L Final  . Total Protein 01/31/2015 7.2  6.4 - 8.3 g/dL Final  . Albumin 01/31/2015 2.7* 3.5 - 5.0 g/dL Final  . Calcium 01/31/2015 9.2  8.4 - 10.4 mg/dL Final  . Anion Gap 01/31/2015 20* 3 - 11 mEq/L Final  . EGFR 01/31/2015 41* >90 ml/min/1.73 m2 Final   eGFR is calculated using the CKD-EPI Creatinine Equation (2009)  . Glucose 01/31/2015 1000  Negative mg/dL Final  . Bilirubin (Urine) 01/31/2015 Negative  Negative Final  . Ketones 01/31/2015 80  Negative mg/dL Final  . Specific Gravity, Urine 01/31/2015 1.025  1.003 - 1.035 Final  . Blood 01/31/2015 Trace  Negative Final    . pH 01/31/2015 5.0  4.6 - 8.0 Final  . Protein 01/31/2015 30  Negative- <30 mg/dL Final  . Urobilinogen, UR 01/31/2015 0.2  0.2 - 1 mg/dL Final  . Nitrite 01/31/2015 Negative  Negative Final  . Leukocyte Esterase 01/31/2015 Negative  Negative Final  . RBC / HPF 01/31/2015 Negative  0 - 2 Final  . WBC, UA 01/31/2015 Negative  0 - 2 Final  . Casts 01/31/2015 Granular,many  Negative Final  . Urine Culture, Routine 01/31/2015 Culture, Urine   Final   Comment: Final - ===== COLONY COUNT: ===== 9,000 COLONIES/ML Insignificant Growth      RADIOGRAPHIC STUDIES: No results found.  ASSESSMENT/PLAN:    Lung cancer Patient received cycle 1, day 1 of his carboplatin/etoposide chemotherapy on 01/26/2015.  He presents to the [facility] today for his Neulasta injection for growth factor support.  Of note-recent restaging MRI of the brain obtained on 01/20/2015 revealed no metastasis. PET scan obtained on 01/25/2015 revealed liver metastasis, multiple bone metastasis to spine pelvis and ribs.   Will cancel planned labs in follow-up visit for this coming Wednesday, 02/02/2015.  Will instead have patient received IV fluid rehydration on Wednesday, 02/02/2015 only.   Dehydration Patient admits to very poor oral intake for the past several days.  He does appear very dehydrated today.  Patient will receive 1 L normal saline IV fluid rehydration today.  He will also return this coming Wednesday and Friday to receive additional IV fluid  rehydration.  Was also encouraged to push fluids at home is much as possible.   Weakness Patient does appear fatigued and very weak today.  He also appears dehydrated today.  Most likely, this is a chemotherapy side effect.  Patient was encouraged to push fluids at home is much as possible.  Also, patient will receive IV fluid rehydration today, this coming Wednesday and Friday as well.   Cancer associated pain Patient has been diagnosed with multiple bone  metastases to both his spine and pelvis; as well as his ribs.  Patient takes oxycodone on an as-needed basis to manage his pain.   Renal insufficiency Patient has history of some mild, chronic renal insufficiency.  Creatinine has increased from 1.3 up to 1.4 today.  Most likely, this is secondary to patient's dehydration.  Hopefully, receiving IV fluid rehydration will improve patient's creatinine.  Will continue to monitor closely.   Hyperphosphatemia Alkaline phosphatase has decreased from 332 down to 308.  Will continue to monitor closely.   Transaminitis Liver enzymes have slightly improved; with AST down to 75 and ALT down to 101.  Will continue to monitor closely.  Patient with known liver metastasis.   Hypoalbuminemia Albumin has decreased from 2.8 down to 2.7.  Patient was encouraged to push protein in his diet is much as possible.   Anorexia Patient is complaining of minimal appetite; and very poor oral intake.  Patient does appear dehydrated and very weak today.  Patient was encouraged to multiple small meals throughout the day if at all possible.   Dysphagia Patient does also have a history of right tonsillar cancer in the past as well; and suffers with some chronic dysphagia.  Patient was encouraged to push fluids and eat soft foods if at all possible.   Patient stated understanding of all instructions; and was in agreement with this plan of care. The patient knows to call the clinic with any problems, questions or concerns.   This was a shared visit with Dr. Julien Nordmann today.  Total time spent with patient was 40 minutes;  with greater than 75 percent of that time spent in face to face counseling regarding patient's symptoms,  and coordination of care and follow up.  Disclaimer: This note was dictated with voice recognition software. Similar sounding words can inadvertently be transcribed and may not be corrected upon review.   Drue Second, NP 02/02/2015    ADDENDUM: Hematology/Oncology Attending: I had a face to face encounter with the patient. I recommended his care plan. This is a very pleasant 81 years old white male recently diagnosed with extensive stage small cell lung cancer with metastases to the liver and bone who started the first cycle of systemic chemotherapy with carboplatin and etoposide last week. The patient presented with significant fatigue and weakness as well as lack of appetite and poor by mouth intake. We will arrange for the patient to receive IV hydration today. He is also scheduled to receive Neulasta injection after his chemotherapy today. Would consider the patient for intravenous hydration for the next few days. He was also encouraged to increase his oral intake with nutritional supplements. He was advised to call immediately if he has any other concerning symptoms or to go immediately to the emergency department if there is any worsening of his condition.  Disclaimer: This note was dictated with voice recognition software. Similar sounding words can inadvertently be transcribed and may be missed upon review. Eilleen Kempf., MD 02/02/2015

## 2015-02-02 NOTE — Assessment & Plan Note (Signed)
Patient does also have a history of right tonsillar cancer in the past as well; and suffers with some chronic dysphagia.  Patient was encouraged to push fluids and eat soft foods if at all possible.

## 2015-02-02 NOTE — Assessment & Plan Note (Signed)
Liver enzymes have slightly improved; with AST down to 75 and ALT down to 101.  Will continue to monitor closely.  Patient with known liver metastasis.

## 2015-02-02 NOTE — Assessment & Plan Note (Signed)
Patient is complaining of minimal appetite; and very poor oral intake.  Patient does appear dehydrated and very weak today.  Patient was encouraged to multiple small meals throughout the day if at all possible.

## 2015-02-02 NOTE — Assessment & Plan Note (Signed)
Alkaline phosphatase has decreased from 332 down to 308.  Will continue to monitor closely.

## 2015-02-02 NOTE — Assessment & Plan Note (Signed)
Patient received cycle 1, day 1 of his carboplatin/etoposide chemotherapy on 01/26/2015.  He presents to the [facility] today for his Neulasta injection for growth factor support.  Of note-recent restaging MRI of the brain obtained on 01/20/2015 revealed no metastasis. PET scan obtained on 01/25/2015 revealed liver metastasis, multiple bone metastasis to spine pelvis and ribs.   Will cancel planned labs in follow-up visit for this coming Wednesday, 02/02/2015.  Will instead have patient received IV fluid rehydration on Wednesday, 02/02/2015 only.

## 2015-02-02 NOTE — Progress Notes (Signed)
INITIAL NUTRITION ASSESSMENT  DOCUMENTATION CODES Per approved criteria  -Non-severe (moderate) malnutrition in the context of chronic illness  Pt meets criteria for moderate MALNUTRITION in the context of chronic illness as evidenced by 19% weight loss x 4 months and mild-moderate fat and muscle depletion.  INTERVENTION: -Continue Resource Breeze po TID, each supplement provides 250 kcal and 9 grams of protein -Recommend Glucerna Shake po TID, each supplement provides 220 kcal and 10 grams of protein when diet advanced past clear liquids -Encourage PO intake -RD to continue to monitor  NUTRITION DIAGNOSIS: Inadequate oral intake related to dysphagia as evidenced by poor PO intake x 5 days.   Goal: Pt to meet >/= 90% of their estimated nutrition needs   Monitor:  PO and supplemental intake, weight, labs, I/O's  Reason for Assessment: Consult for nutritional assessment  Admitting Dx: Dehydration  ASSESSMENT: 81 y.o. male with a history of Lung Cancer diagnosed 12/2014 who had his first round of chemotherapy on 04/13, 04/14, and 04/15 who was brought tot he ED due to poor appetite and no intake of foods or liquids for the past 5 days.  Pt in room with no family present. Pt was drinking juice on his clear liquid tray. Pt drank all of his nectar thick juice but did not eat anything else. Pt states he hasn't been eating well for days (per H&P, 5 days). Pt states his appetite decreased "a day or so ago".  Pt was unable to provide UBW.  Per weight history, pt has lost 32 lb since 09/28/14 (19% weight loss x 4 months, significant for time frame).  Per interdisciplinary rounds, pt may have swallow evaluation ordered.  Pt has been ordered [employer] supplements while on clear liquid diet. D/t elevated glucose levels, pt would benefit from having Glucerna shakes ordered once his diet is advanced past clears.  Nutrition Focused Physical Exam:  Subcutaneous Fat:  Orbital Region:  WNL Upper Arm Region: mild-moderate depletion Thoracic and Lumbar Region: NA  Muscle:  Temple Region: mild depletion Clavicle Bone Region: mild depletion Clavicle and Acromion Bone Region: mild depletion Scapular Bone Region: NA Dorsal Hand: moderate depletion Patellar Region: mild depletion Anterior Thigh Region: NA Posterior Calf Region: mild-moderate depletion  Edema: no LE edema  Labs reviewed: Elevated Na, BUN & Creatinine Glucose 235  Height: Ht Readings from Last 1 Encounters:  02/05/2015 '5\' 11"'$  (1.803 m)    Weight: Wt Readings from Last 1 Encounters:  01/31/2015 135 lb 2.3 oz (61.3 kg)    Ideal Body Weight: 172 lb  % Ideal Body Weight: 78%  Wt Readings from Last 10 Encounters:  02/04/2015 135 lb 2.3 oz (61.3 kg)  01/21/15 146 lb 3.2 oz (66.316 kg)  01/20/15 160 lb (72.576 kg)  01/20/15 155 lb (70.308 kg)  01/13/15 150 lb (68.04 kg)  01/11/15 151 lb 6.4 oz (68.675 kg)  12/29/14 157 lb (71.215 kg)  12/28/14 157 lb (71.215 kg)  11/08/14 165 lb (74.844 kg)  09/28/14 167 lb (75.751 kg)    Usual Body Weight: pt unsure, 160-170 lb per weight history  % Usual Body Weight: 84%  BMI:  Body mass index is 18.86 kg/(m^2).  Estimated Nutritional Needs: Kcal: 1800-2000 Protein: 85-95g Fluid: 1.8L/day  Skin: stage II sacral ulcer  Diet Order: Diet clear liquid Room service appropriate?: Yes; Fluid consistency:: Thin  EDUCATION NEEDS: -No education needs identified at this time   Intake/Output Summary (Last 24 hours) at 02/02/15 1201 Last data filed at 02/02/15 1032  Gross  per 24 hour  Intake 1033.33 ml  Output    700 ml  Net 333.33 ml    Last BM: 4/19  Labs:   Recent Labs Lab 01/31/15 1052 02/03/2015 1839 02/02/15 0355  NA 141 149* 149*  K 4.7 3.9 3.8  CL  --  117* 120*  CO2 13* 21 21  BUN 36.2* 49* 46*  CREATININE 1.6* 1.67* 1.46*  CALCIUM 9.2 8.5 8.2*  GLUCOSE 399* 102* 235*    CBG (last 3)  No results for input(s): GLUCAP in the last  72 hours.  Scheduled Meds: . antiseptic oral rinse  7 mL Mouth Rinse q12n4p  . aspirin EC  81 mg Oral Daily  . atorvastatin  10 mg Oral Q M,W,F  . chlorhexidine  15 mL Mouth Rinse BID  . donepezil  10 mg Oral QHS  . enoxaparin (LOVENOX) injection  40 mg Subcutaneous Q24H  . feeding supplement (RESOURCE BREEZE)  1 Container Oral TID BM  . insulin detemir  10 Units Subcutaneous QHS  . loratadine  10 mg Oral Daily  . mirtazapine  30 mg Oral QHS  . multivitamin with minerals  1 tablet Oral Daily  . sodium chloride  3 mL Intravenous Q12H    Continuous Infusions: . sodium chloride 100 mL/hr at 01/21/2015 2255    Past Medical History  Diagnosis Date  . Hyperlipidemia   . AAA (abdominal aortic aneurysm)     status post repair in 2005  . Hx of colonic polyps   . CKD (chronic kidney disease)     last creatinine   . Oropharynx cancer   . Cancer 04/28/10    R base of tongue, inv squamous cell, HPV +  . Diabetes mellitus type I   . Hypertension   . COPD (chronic obstructive pulmonary disease)   . Hx of radiation therapy 06/01/10 to 07/21/10    R base of tongue  . Anemia     mild/chemo-induced  . DVT (deep venous thrombosis)     Past Surgical History  Procedure Laterality Date  . Tonsillectomy    . Colonoscopy w/ polypectomy    . Hemorrhoid surgery    . Inguinal hernia repair    . Penial implant    . Rotator cuff repair    . Appendectomy    . Abdominal aortic aneurysm repair      Clayton Bibles, MS, RD, LDN Pager: [phone] After Hours Pager: [phone]

## 2015-02-03 ENCOUNTER — Ambulatory Visit: Payer: Medicare HMO | Admitting: Family Medicine

## 2015-02-03 DIAGNOSIS — E440 Moderate protein-calorie malnutrition: Secondary | ICD-10-CM | POA: Insufficient documentation

## 2015-02-03 LAB — BASIC METABOLIC PANEL
ANION GAP: 7 (ref 5–15)
BUN: 31 mg/dL — ABNORMAL HIGH (ref 6–23)
CALCIUM: 7.5 mg/dL — AB (ref 8.4–10.5)
CO2: 20 mmol/L (ref 19–32)
CREATININE: 1.09 mg/dL (ref 0.50–1.35)
Chloride: 108 mmol/L (ref 96–112)
GFR calc Af Amer: 73 mL/min — ABNORMAL LOW (ref >90)
GFR, EST NON AFRICAN AMERICAN: 63 mL/min — AB (ref >90)
Glucose, Bld: 364 mg/dL — ABNORMAL HIGH (ref 70–99)
Potassium: 3.6 mmol/L (ref 3.5–5.1)
Sodium: 135 mmol/L (ref 135–145)

## 2015-02-03 LAB — GLUCOSE, CAPILLARY
GLUCOSE-CAPILLARY: 120 mg/dL — AB (ref 70–99)
GLUCOSE-CAPILLARY: 267 mg/dL — AB (ref 70–99)
Glucose-Capillary: 181 mg/dL — ABNORMAL HIGH (ref 70–99)
Glucose-Capillary: 424 mg/dL — ABNORMAL HIGH (ref 70–99)
Glucose-Capillary: 302 mg/dL — ABNORMAL HIGH (ref 70–99)
Glucose-Capillary: 189 mg/dL — ABNORMAL HIGH (ref 70–99)

## 2015-02-03 MED ORDER — SODIUM CHLORIDE 0.9 % IV SOLN
INTRAVENOUS | Status: AC
  Administered 2015-02-03: 08:00:00 via INTRAVENOUS

## 2015-02-03 MED ORDER — INSULIN ASPART 100 UNIT/ML ~~LOC~~ SOLN: 15.0000 [IU] | Freq: Once | SUBCUTANEOUS | Status: DC

## 2015-02-03 MED ORDER — INSULIN ASPART 100 UNIT/ML ~~LOC~~ SOLN
6.0000 [IU] | Freq: Once | SUBCUTANEOUS | Status: AC
  Administered 2015-02-03: 6 [IU] via SUBCUTANEOUS

## 2015-02-03 NOTE — Progress Notes (Signed)
Inpatient Diabetes Program Recommendations  AACE/ADA: New Consensus Statement on Inpatient Glycemic Control (2013)  Target Ranges:  Prepandial:   less than 140 mg/dL      Peak postprandial:   less than 180 mg/dL (1-2 hours)      Critically ill patients:  140 - 180 mg/dL   Reason for Visit: Hyperglycemia  Diabetes history: Type 1 DM  Results for Dustin, Vance (MRN [id number]) as of 02/03/2015 12:01  Ref. Range 01/25/2015 08:10 02/02/2015 18:13 02/03/2015 00:07 02/03/2015 07:36  Glucose-Capillary Latest Ref Range: 70-99 mg/dL 138 (H) 181 (H) 267 (H) 424 (H)   Needs basal insulin and meal coverage, along with correction insulin to prevent DKA in Type 1 DM pt   Inpatient Diabetes Program Recommendations Insulin - Basal: Needs basal insulin since Type 1 DM - Please consider restarting Lantus 10 units QHS Insulin - Meal Coverage: Novolog 4 units tidwc if pt eats >50% meal.  Will continue to follow. Thank you. Lorenda Peck, RD, LDN, CDE Inpatient Diabetes Coordinator [phone]

## 2015-02-03 NOTE — Progress Notes (Signed)
[facility] PROGRESS NOTE  Dustin Vance YPP:[id number] DOB: 1932/12/27 DOA: 02/08/2015 PCP: Joycelyn Man, MD  Assessment/Plan: 1. Hypernatremia- sodium is now improved to 139, will discontinue D5W and start normal saline at 75 mL per hour. 2. Lethargy, failure to thrive- due to chemotherapy and lung cancer. Nutrition consulted for nutritional assessment. 3. Lung cancer- patient currently on chemotherapy, called and discussed with oncology. Will continue with IV hydration. 4. Diabetes mellitus- will  continue to hold Levemir at this time, initiate sliding scale insulin with NovoLog 5. COPD- stable, no shortness of breath. Continue duo nebs when necessary 6. DVT prophylaxis- Lovenox  Code Status: Full code Family Communication: Discussed with patient's wife at bedside Disposition Plan: To be decided   Consultants:  None  Procedures:  None  Antibiotics:  None  HPI/Subjective: 81 year old male with a history of lung cancer diagnosed March 2016 had first round on chemotherapy on April 13, 14, 15. Patient was brought to the ED for or appetite and poor by mouth intake of fluids. Patient received Neulasta 2 days ago. Patient found to be hypernatremic with  Dehydration and started on IV fluids.  Patient is more alert this morning. Sodium has improved to 135. Still has poor by mouth intake.  Objective: Filed Vitals:   02/03/15 0506  BP: 140/68  Pulse: 82  Temp: 98.5 F (36.9 C)  Resp: 20    Intake/Output Summary (Last 24 hours) at 02/03/15 1408 Last data filed at 02/03/15 0507  Gross per 24 hour  Intake 1648.33 ml  Output    950 ml  Net 698.33 ml   Filed Weights   02/04/2015 2119  Weight: 61.3 kg (135 lb 2.3 oz)    Exam:   General:  Appears in no acute distress  Cardiovascular: S1-S2 is regular  Respiratory: Clear to auscultation bilaterally  Abdomen: Soft, nontender, no organomegaly  Musculoskeletal: No edema noted in the lower extremities    Data Reviewed: Basic Metabolic Panel:  Recent Labs Lab 01/31/15 1052 02/10/2015 1839 02/02/15 0355 02/03/15 0430  NA 141 149* 149* 135  K 4.7 3.9 3.8 3.6  CL  --  117* 120* 108  CO2 13* '21 21 20  '$ GLUCOSE 399* 102* 235* 364*  BUN 36.2* 49* 46* 31*  CREATININE 1.6* 1.67* 1.46* 1.09  CALCIUM 9.2 8.5 8.2* 7.5*   Liver Function Tests:  Recent Labs Lab 01/31/15 1052  AST 43*  ALT 53  ALKPHOS 221*  BILITOT 0.48  PROT 7.2  ALBUMIN 2.7*   No results for input(s): LIPASE, AMYLASE in the last 168 hours. No results for input(s): AMMONIA in the last 168 hours. CBC:  Recent Labs Lab 01/31/15 1052 02/12/2015 1839 02/02/15 0355  WBC 8.0 12.0* 6.7  NEUTROABS 7.2* 11.3*  --   HGB 12.1* 11.6* 10.9*  HCT 38.0* 36.7* 34.4*  MCV 94.8 94.6 95.6  PLT 204 142* 106*   Cardiac Enzymes: No results for input(s): CKTOTAL, CKMB, CKMBINDEX, TROPONINI in the last 168 hours. BNP (last 3 results) No results for input(s): BNP in the last 8760 hours.  ProBNP (last 3 results) No results for input(s): PROBNP in the last 8760 hours.  CBG:  Recent Labs Lab 02/02/15 1813 02/03/15 0007 02/03/15 0736 02/03/15 1238  GLUCAP 181* 267* 424* 120*    Recent Results (from the past 240 hour(s))  Urine Culture     Status: None   Collection Time: 01/31/15 10:53 AM  Result Value Ref Range Status   Urine Culture, Routine Culture, Urine  Final  Comment: Final - ===== COLONY COUNT: ===== 9,000 COLONIES/ML Insignificant Growth      Studies: No results found.  Scheduled Meds: . sodium chloride   Intravenous STAT  . antiseptic oral rinse  7 mL Mouth Rinse q12n4p  . aspirin EC  81 mg Oral Daily  . atorvastatin  10 mg Oral Q M,W,F  . chlorhexidine  15 mL Mouth Rinse BID  . donepezil  10 mg Oral QHS  . enoxaparin (LOVENOX) injection  40 mg Subcutaneous Q24H  . feeding supplement (RESOURCE BREEZE)  1 Container Oral TID BM  . insulin aspart  0-9 Units Subcutaneous TID WC  . loratadine  10 mg  Oral Daily  . mirtazapine  30 mg Oral QHS  . multivitamin with minerals  1 tablet Oral Daily  . sodium chloride  3 mL Intravenous Q12H   Continuous Infusions:    Principal Problem:   Dehydration Active Problems:   Essential hypertension   COPD (chronic obstructive pulmonary disease)   Oropharynx cancer   Lung cancer   AKI (acute kidney injury)   Adult failure to thrive syndrome   Hypernatremia   Malnutrition of moderate degree    Time spent: 25 min    [facility]  [facility] Pager [phone]*. If 7PM-7AM, please contact night-coverage at [email or web address], password [facility] 02/03/2015, 2:08 PM  LOS: 2 days

## 2015-02-03 NOTE — Progress Notes (Signed)
Nutrition Brief Follow-up  RD consulted for nutritional assessment. RD initially assessed patient on 4/20. Ordered [employer] and diagnosed pt with moderate malnutrition.  Pt continues with elevated CBGs. Recommend Glucerna shakes when diet is advanced past clears.  RD to follow-up when diet is advanced.  Wt Readings from Last 15 Encounters:  01/25/2015 135 lb 2.3 oz (61.3 kg)  01/21/15 146 lb 3.2 oz (66.316 kg)  01/20/15 160 lb (72.576 kg)  01/20/15 155 lb (70.308 kg)  01/13/15 150 lb (68.04 kg)  01/11/15 151 lb 6.4 oz (68.675 kg)  12/29/14 157 lb (71.215 kg)  12/28/14 157 lb (71.215 kg)  11/08/14 165 lb (74.844 kg)  09/28/14 167 lb (75.751 kg)  09/06/14 170 lb (77.111 kg)  07/22/14 172 lb 1.6 oz (78.064 kg)  07/07/14 172 lb (78.019 kg)  06/28/14 170 lb 4.8 oz (77.248 kg)  06/22/14 169 lb (76.658 kg)    Body mass index is 18.86 kg/(m^2). Patient meets criteria for normal range based on current BMI.   Current diet order is Clear liquids, patient is consuming approximately 50% of meals at this time. Labs and medications reviewed.    Clayton Bibles, MS, RD, LDN Pager: [phone] After Hours Pager: [phone]

## 2015-02-04 ENCOUNTER — Ambulatory Visit: Payer: Medicare HMO

## 2015-02-04 LAB — GLUCOSE, RANDOM: Glucose, Bld: 511 mg/dL — ABNORMAL HIGH (ref 70–99)

## 2015-02-04 LAB — GLUCOSE, CAPILLARY
GLUCOSE-CAPILLARY: 186 mg/dL — AB (ref 70–99)
Glucose-Capillary: 425 mg/dL — ABNORMAL HIGH (ref 70–99)

## 2015-02-04 MED ORDER — INSULIN ASPART 100 UNIT/ML ~~LOC~~ SOLN
10.0000 [IU] | Freq: Once | SUBCUTANEOUS | Status: AC
  Administered 2015-02-04: 10 [IU] via SUBCUTANEOUS

## 2015-02-04 MED ORDER — GUAIFENESIN ER 600 MG PO TB12
600.0000 mg | ORAL_TABLET | Freq: Two times a day (BID) | ORAL | Status: DC
  Administered 2015-02-04 (×2): 600 mg via ORAL
  Filled 2015-02-04 (×4): qty 1

## 2015-02-04 MED ORDER — FUROSEMIDE 10 MG/ML IJ SOLN
20.0000 mg | Freq: Once | INTRAMUSCULAR | Status: AC
  Administered 2015-02-04: 20 mg via INTRAVENOUS
  Filled 2015-02-04: qty 2

## 2015-02-04 MED ORDER — MEGESTROL ACETATE 400 MG/10ML PO SUSP
400.0000 mg | Freq: Two times a day (BID) | ORAL | Status: DC
  Administered 2015-02-04 (×2): 400 mg via ORAL
  Filled 2015-02-04 (×4): qty 10

## 2015-02-04 MED ORDER — INSULIN DETEMIR 100 UNIT/ML ~~LOC~~ SOLN
15.0000 [IU] | Freq: Every day | SUBCUTANEOUS | Status: DC
  Administered 2015-02-04: 15 [IU] via SUBCUTANEOUS
  Filled 2015-02-04 (×2): qty 0.15

## 2015-02-04 NOTE — Evaluation (Signed)
Physical Therapy Evaluation Patient Details Name: Dustin Vance MRN: [id number] DOB: December 25, 1932 Today's Date: 02/04/2015   History of Present Illness  Dustin Vance is a 81 y.o. male with a history of Lung Cancer diagnosed 12/2014 who had his first round of chemotherapy.Admitted 3/19/16throught the  ED due to poor appetite and no intake of foods or liquids for the past 5 days..  He has had increased weakness and could not get out of bed today and was advised to go to the ED. He denies any fevers or chills or nausea or vomiting, but his family report that he has had loose stools.   Clinical Impression  Patient is EXTREMELY  Weak, BP after sitting <15 seconds at EOB=98/59, HR 124, Dyspnea 3/4. Attempted a second time at sitting, patient just becomes listless and slumps back to bed. Assisted  To reposition in bed. Daughter present. Daughter will discuss with patient's wife  How weak that patient presents with  PT. Uncertain if patient will be able to tolerate therapies at this time if SNF is an option. Daughter states wife is primary caregiver. Patient will benefit from PT to address problems listed in note below if able to tolerate the activity.    Follow Up Recommendations SNF;Home health PT;Supervision/Assistance - 24 hour    Equipment Recommendations  Hospital bed;3in1 (PT)    Recommendations for Other Services       Precautions / Restrictions Precautions Precautions: Fall Precaution Comments: HYPOTENSIVE, CHECK bp      Mobility  Bed Mobility Overal bed mobility: Needs Assistance Bed Mobility: Rolling;Supine to Sit;Sit to Supine Rolling: Mod assist   Supine to sit: +2 for safety/equipment;Max assist Sit to supine: +2 for safety/equipment;Max assist   General bed mobility comments: patient initiates moving legs toward edge requires max assist  for trunk to sitting upright. Decreased control to lie down as patient appeared to go listliss, was  responsive throughout.. Sat  up at edge x 2, each time sat <15 seconds. reports  that he feels dizzy, difficult to understand much of his  verbalizations  Transfers                 General transfer comment: unable to test, too weak and hypotensive.  Ambulation/Gait                Stairs            Wheelchair Mobility    Modified Rankin (Stroke Patients Only)       Balance Overall balance assessment: Needs assistance Sitting-balance support: Bilateral upper extremity supported;Feet supported Sitting balance-Leahy Scale: Zero Sitting balance - Comments: pt is very listless and does not support self                                     Pertinent Vitals/Pain Pain Assessment: Faces Faces Pain Scale: Hurts a little bit Pain Location: generalized Pain Intervention(s): Limited activity within patient's tolerance;Monitored during session    [place] expects to be discharged to:: Private residence   Available Help at Discharge: Family;Available 24 hours/day Type of Home: House Home Access: Stairs to enter   [employer] of Steps: 1 Home Layout: One level Home Equipment: Gilford Rile - 2 wheels Additional Comments: per daughter, patient ambulatory  until  about 4/18, has not been OOB since 4/19.    Prior Function Level of Independence: Needs assistance   Gait / Transfers Assistance Needed:  walking with assist  for several days PTA           Hand Dominance        Extremity/Trunk Assessment   Upper Extremity Assessment: Generalized weakness           Lower Extremity Assessment: Generalized weakness      Cervical / Trunk Assessment: Other exceptions  Communication      Cognition Arousal/Alertness: Lethargic Behavior During Therapy: Flat affect Overall Cognitive Status: Impaired/Different from baseline Area of Impairment: Orientation Orientation Level: Place             General Comments: patient's speec is so slurred and  soft that  it s difficult  to understand     General Comments      Exercises        Assessment/Plan    PT Assessment Patient needs continued PT services (trial as patient is extremely weak )  PT Diagnosis Difficulty walking;Generalized weakness   PT Problem List Decreased strength;Decreased activity tolerance;Decreased balance;Decreased mobility;Decreased cognition;Decreased knowledge of use of DME;Decreased safety awareness;Decreased knowledge of precautions;Cardiopulmonary status limiting activity  PT Treatment Interventions DME instruction;Gait training;Functional mobility training;Therapeutic activities;Therapeutic exercise;Patient/family education   PT Goals (Current goals can be found in the Care Plan section) Acute Rehab PT Goals Patient Stated Goal: per daughter, for patient to get stronger PT Goal Formulation: With family Time For Goal Achievement: 02/18/15 Potential to Achieve Goals: Fair    Frequency Min 3X/week   Barriers to discharge Decreased caregiver support      Co-evaluation               End of Session   Activity Tolerance: Patient limited by fatigue;Treatment limited secondary to medical complications (Comment) Patient left: in bed;with call bell/phone within reach;with family/visitor present Nurse Communication: Mobility status (BP 98/59, HR 124)         Time: 7846-9629 PT Time Calculation (min) (ACUTE ONLY): 26 min   Charges:   PT Evaluation $Initial PT Evaluation Tier I: 1 Procedure PT Treatments $Therapeutic Activity: 8-22 mins   PT G Codes:        Claretha Cooper 02/04/2015, 11:45 AM  Tresa Endo PT [phone]

## 2015-02-04 NOTE — Progress Notes (Signed)
CRITICAL VALUE ALERT  Critical value received:  *425**  Date of notification:  02/04/2015  Time of notification:  0900  Critical value read back:Yes.    Nurse who received alert:  Cadden Elizondo RN Glori Luis   MD notified (1st page):  **dr Darrick Meigs *  Time of first page:  **0900*  MD notified (2nd page):  Time of second page:  Responding MD:  Dr Darrick Meigs  Time MD responded:  0900

## 2015-02-04 NOTE — Care Management Note (Signed)
CARE MANAGEMENT NOTE 02/04/2015  Patient:  Dustin Vance, Dustin Vance   Account Number:  [id number]  Date Initiated:  02/04/2015  Documentation initiated by:  Marney Doctor  Subjective/Objective Assessment:   81 yo admitted with dehydration     Action/Plan:   From home with wife   Anticipated DC Date:  02/05/2015   Anticipated DC Plan:  [place]  CM consult      [facility] Choice  HOME HEALTH   Choice offered to / List presented to:  C-3 Spouse   DME arranged  3-N-1  HOSPITAL BED      DME agency  [place] arranged  HH-1 RN  [place]      [place].   Status of service:  In process, will continue to follow Medicare Important Message given?  YES (If response is "NO", the following Medicare IM given date fields will be blank) Date Medicare IM given:  02/04/2015 Medicare IM given by:  Marney Doctor Date Additional Medicare IM given:   Additional Medicare IM given by:    Discharge Disposition:    Per UR Regulation:  Reviewed for med. necessity/level of care/duration of stay  If discussed at [place] of Stay Meetings, dates discussed:    Comments:  02/04/15 Marney Doctor RN,BSN,NCM [phone] PT is recommending SNF/HHPT with 24hr supervision. Pt's wife would like to try and take him home.  She states they have used [facility] in the past and would like to use them again. [facility] [place] services rep given referral. PT is recommending Hospital Bed and 3 in 1 for pt as well. [facility] DME rep given referral for equipment. MD asked for orders.

## 2015-02-04 NOTE — Progress Notes (Signed)
[facility] PROGRESS NOTE  JAYTEN GABBARD NWG:[id number] DOB: 05-11-1933 DOA: 01/27/2015 PCP: Joycelyn Man, MD  Assessment/Plan: 1. Hypernatremia- resolved sodium is 135 2. Pulmonary edema- patient has due to fluid overload from IV fluids. Was given one dose of Lasix 20 g IV 1 with good diuretic response. Will repeat another dose of 20 mg IV Lasix. Will check BMP in a.m. 3. Lethargy, failure to thrive- due to chemotherapy and lung cancer. Nutrition consulted for nutritional assessment. Family requesting appetite stimulant. We'll start Megace while patient in the hospital 4. Lung cancer- patient currently on chemotherapy, called and discussed with oncology. Will follow oncology as outpatient. 5. Diabetes mellitus- will hold Levemir at this time, initiate sliding scale insulin with NovoLog 6. COPD-  Continue duo nebs when necessary 7. DVT prophylaxis- Lovenox  Code Status: Full code Family Communication: No family at bedside Disposition Plan: To be decided   Consultants:  None  Procedures:  None  Antibiotics:  None  HPI/Subjective: 81 year old male with a history of lung cancer diagnosed March 2016 had first round on chemotherapy on April 13, 14, 15. Patient was brought to the ED for or appetite and poor by mouth intake of fluids. Patient received Neulasta 2 days ago. Patient found to be hypernatremic with  Dehydration and started on IV fluids. This morning patient is alert, oriented, communicating. He does appear to have labored breathing.  Objective: Filed Vitals:   02/04/15 1132  BP: 98/59  Pulse: 124  Temp:   Resp:     Intake/Output Summary (Last 24 hours) at 02/04/15 1306 Last data filed at 02/04/15 0600  Gross per 24 hour  Intake    497 ml  Output    700 ml  Net   -203 ml   Filed Weights   01/17/2015 2119  Weight: 61.3 kg (135 lb 2.3 oz)    Exam:   General:  Appears lethargic, arousable to verbal stimuli  Cardiovascular: S1-S2 is  regular  Respiratory: Bilateral crackles  Abdomen: Soft, nontender, no organomegaly  Musculoskeletal: No edema noted in the lower extremities   Data Reviewed: Basic Metabolic Panel:  Recent Labs Lab 01/31/15 1052 02/05/2015 1839 02/02/15 0355 02/03/15 0430 02/04/15 0919  NA 141 149* 149* 135  --   K 4.7 3.9 3.8 3.6  --   CL  --  117* 120* 108  --   CO2 13* '21 21 20  '$ --   GLUCOSE 399* 102* 235* 364* 511*  BUN 36.2* 49* 46* 31*  --   CREATININE 1.6* 1.67* 1.46* 1.09  --   CALCIUM 9.2 8.5 8.2* 7.5*  --    Liver Function Tests:  Recent Labs Lab 01/31/15 1052  AST 43*  ALT 53  ALKPHOS 221*  BILITOT 0.48  PROT 7.2  ALBUMIN 2.7*   No results for input(s): LIPASE, AMYLASE in the last 168 hours. No results for input(s): AMMONIA in the last 168 hours. CBC:  Recent Labs Lab 01/31/15 1052 01/21/2015 1839 02/02/15 0355  WBC 8.0 12.0* 6.7  NEUTROABS 7.2* 11.3*  --   HGB 12.1* 11.6* 10.9*  HCT 38.0* 36.7* 34.4*  MCV 94.8 94.6 95.6  PLT 204 142* 106*   Cardiac Enzymes: No results for input(s): CKTOTAL, CKMB, CKMBINDEX, TROPONINI in the last 168 hours. BNP (last 3 results) No results for input(s): BNP in the last 8760 hours.  ProBNP (last 3 results) No results for input(s): PROBNP in the last 8760 hours.  CBG:  Recent Labs Lab 02/03/15 0736 02/03/15 1238 02/03/15  1910 02/03/15 2251 02/04/15 0856  GLUCAP 424* 120* 302* 189* 425*    Recent Results (from the past 240 hour(s))  Urine Culture     Status: None   Collection Time: 01/31/15 10:53 AM  Result Value Ref Range Status   Urine Culture, Routine Culture, Urine  Final    Comment: Final - ===== COLONY COUNT: ===== 9,000 COLONIES/ML Insignificant Growth      Studies: No results found.  Scheduled Meds: . antiseptic oral rinse  7 mL Mouth Rinse q12n4p  . aspirin EC  81 mg Oral Daily  . atorvastatin  10 mg Oral Q M,W,F  . chlorhexidine  15 mL Mouth Rinse BID  . donepezil  10 mg Oral QHS  .  enoxaparin (LOVENOX) injection  40 mg Subcutaneous Q24H  . feeding supplement (RESOURCE BREEZE)  1 Container Oral TID BM  . furosemide  20 mg Intravenous Once  . guaiFENesin  600 mg Oral BID  . insulin aspart  0-9 Units Subcutaneous TID WC  . insulin detemir  15 Units Subcutaneous Daily  . loratadine  10 mg Oral Daily  . mirtazapine  30 mg Oral QHS  . multivitamin with minerals  1 tablet Oral Daily  . sodium chloride  3 mL Intravenous Q12H   Continuous Infusions:    Principal Problem:   Dehydration Active Problems:   Essential hypertension   COPD (chronic obstructive pulmonary disease)   Oropharynx cancer   Lung cancer   AKI (acute kidney injury)   Adult failure to thrive syndrome   Hypernatremia   Malnutrition of moderate degree    Time spent: 25 min    [facility]  [facility] Pager [phone]*. If 7PM-7AM, please contact night-coverage at [email or web address], password [facility] 02/04/2015, 1:06 PM  LOS: 3 days

## 2015-02-04 NOTE — Progress Notes (Signed)
Inpatient Diabetes Program Recommendations  AACE/ADA: New Consensus Statement on Inpatient Glycemic Control (2013)  Target Ranges:  Prepandial:   less than 140 mg/dL      Peak postprandial:   less than 180 mg/dL (1-2 hours)      Critically ill patients:  140 - 180 mg/dL    Results for Dustin Vance, Dustin Vance (MRN [id number]) as of 02/04/2015 08:11  Ref. Range 02/03/2015 00:07 02/03/2015 07:36 02/03/2015 12:38 02/03/2015 19:10 02/03/2015 22:51  Glucose-Capillary Latest Ref Range: 70-99 mg/dL 267 (H) 424 (H) 120 (H) 302 (H) 189 (H)      Home DM Meds: Levemir 30 units QHS       Novolog 15 units tidwc  Current Orders: Novolog Sensitive SSI tidwc    **Per H&P, patient has History of Type 1 DM.  Does not make endogenous insulin so needs injections of both basal insulin and rapid-acting insulin to maintain euglycemia.  **Patient has not received any basal insulin since 11PM on 04/19.  **Glucose levels very erratic yesterday likely due to the fact that patient has not received any basal insulin in two days.  **Note PO Intake poor- 10% of CL diet yesterday (04/21)    MD- Please consider the following:   1. Add 50% of patient's home dose of Levemir- Levemir 15 units QHS  2. Change Novolog Sensitive SSI back to Q4 hour coverage while PO Intake poor     Will follow Wyn Quaker RN, MSN, CDE Diabetes Coordinator Inpatient Diabetes Program Team Pager: [phone] (8a-5p)

## 2015-02-05 ENCOUNTER — Inpatient Hospital Stay (HOSPITAL_COMMUNITY): Payer: Medicare HMO

## 2015-02-05 DIAGNOSIS — R0600 Dyspnea, unspecified: Secondary | ICD-10-CM

## 2015-02-05 DIAGNOSIS — J9601 Acute respiratory failure with hypoxia: Secondary | ICD-10-CM

## 2015-02-05 DIAGNOSIS — R0602 Shortness of breath: Secondary | ICD-10-CM

## 2015-02-05 DIAGNOSIS — D696 Thrombocytopenia, unspecified: Secondary | ICD-10-CM

## 2015-02-05 DIAGNOSIS — J9600 Acute respiratory failure, unspecified whether with hypoxia or hypercapnia: Secondary | ICD-10-CM

## 2015-02-05 LAB — BLOOD GAS, ARTERIAL
ACID-BASE DEFICIT: 3.3 mmol/L — AB (ref 0.0–2.0)
Acid-base deficit: 2.1 mmol/L — ABNORMAL HIGH (ref 0.0–2.0)
Acid-base deficit: 3.9 mmol/L — ABNORMAL HIGH (ref 0.0–2.0)
BICARBONATE: 21.1 meq/L (ref 20.0–24.0)
BICARBONATE: 19.8 meq/L — AB (ref 20.0–24.0)
BICARBONATE: 20.3 meq/L (ref 20.0–24.0)
DELIVERY SYSTEMS: POSITIVE
DRAWN BY: 103701
Delivery systems: POSITIVE
Delivery systems: POSITIVE
Drawn by: 103701
EXPIRATORY PAP: 5
Expiratory PAP: 5
Expiratory PAP: 5
FIO2: 1 %
FIO2: 1 %
FIO2: 1 %
INSPIRATORY PAP: 10
INSPIRATORY PAP: 10
Inspiratory PAP: 10
LHR: 10 {breaths}/min
Mode: POSITIVE
O2 SAT: 94.1 %
O2 SAT: 91.2 %
O2 Saturation: 97.2 %
PATIENT TEMPERATURE: 98.6
PATIENT TEMPERATURE: 98.6
PH ART: 7.406 (ref 7.350–7.450)
Patient temperature: 99.1
RATE: 10 resp/min
TCO2: 19.4 mmol/L (ref 0–100)
TCO2: 18 mmol/L (ref 0–100)
TCO2: 18.9 mmol/L (ref 0–100)
pCO2 arterial: 32.9 mmHg — ABNORMAL LOW (ref 35.0–45.0)
pCO2 arterial: 33.1 mmHg — ABNORMAL LOW (ref 35.0–45.0)
pCO2 arterial: 33 mmHg — ABNORMAL LOW (ref 35.0–45.0)
pH, Arterial: 7.425 (ref 7.350–7.450)
pH, Arterial: 7.394 (ref 7.350–7.450)
pO2, Arterial: 74.1 mmHg — ABNORMAL LOW (ref 80.0–100.0)
pO2, Arterial: 65.7 mmHg — ABNORMAL LOW (ref 80.0–100.0)
pO2, Arterial: 103 mmHg — ABNORMAL HIGH (ref 80.0–100.0)

## 2015-02-05 LAB — BASIC METABOLIC PANEL
ANION GAP: 8 (ref 5–15)
BUN: 62 mg/dL — ABNORMAL HIGH (ref 6–23)
CHLORIDE: 115 mmol/L — AB (ref 96–112)
CO2: 24 mmol/L (ref 19–32)
Calcium: 8.5 mg/dL (ref 8.4–10.5)
Creatinine, Ser: 2.36 mg/dL — ABNORMAL HIGH (ref 0.50–1.35)
GFR, EST AFRICAN AMERICAN: 28 mL/min — AB (ref >90)
GFR, EST NON AFRICAN AMERICAN: 25 mL/min — AB (ref >90)
Glucose, Bld: 104 mg/dL — ABNORMAL HIGH (ref 70–99)
Potassium: 3.9 mmol/L (ref 3.5–5.1)
Sodium: 147 mmol/L — ABNORMAL HIGH (ref 135–145)

## 2015-02-05 LAB — CBC WITH DIFFERENTIAL/PLATELET
BASOS PCT: 0 % (ref 0–1)
Basophils Absolute: 0 10*3/uL (ref 0.0–0.1)
EOS ABS: 0 10*3/uL (ref 0.0–0.7)
Eosinophils Relative: 0 % (ref 0–5)
HEMATOCRIT: 33.6 % — AB (ref 39.0–52.0)
HEMOGLOBIN: 10.9 g/dL — AB (ref 13.0–17.0)
LYMPHS PCT: 94 % — AB (ref 12–46)
Lymphs Abs: 0.4 10*3/uL — ABNORMAL LOW (ref 0.7–4.0)
MCH: 30.4 pg (ref 26.0–34.0)
MCHC: 32.4 g/dL (ref 30.0–36.0)
MCV: 93.9 fL (ref 78.0–100.0)
MONO ABS: 0 10*3/uL — AB (ref 0.1–1.0)
Monocytes Relative: 3 % (ref 3–12)
NEUTROS ABS: 0 10*3/uL — AB (ref 1.7–7.7)
Neutrophils Relative %: 3 % — ABNORMAL LOW (ref 43–77)
Platelets: 31 10*3/uL — ABNORMAL LOW (ref 150–400)
RBC: 3.58 MIL/uL — ABNORMAL LOW (ref 4.22–5.81)
RDW: 14.3 % (ref 11.5–15.5)
WBC: 0.4 10*3/uL — CL (ref 4.0–10.5)

## 2015-02-05 LAB — GLUCOSE, CAPILLARY
GLUCOSE-CAPILLARY: 357 mg/dL — AB (ref 70–99)
GLUCOSE-CAPILLARY: 100 mg/dL — AB (ref 70–99)
GLUCOSE-CAPILLARY: 72 mg/dL (ref 70–99)
Glucose-Capillary: 92 mg/dL (ref 70–99)
Glucose-Capillary: 129 mg/dL — ABNORMAL HIGH (ref 70–99)
Glucose-Capillary: 198 mg/dL — ABNORMAL HIGH (ref 70–99)

## 2015-02-05 LAB — TROPONIN I: TROPONIN I: 0.26 ng/mL — AB (ref <0.031)

## 2015-02-05 MED ORDER — DEXTROSE 5 % IV SOLN
INTRAVENOUS | Status: DC
  Administered 2015-02-05 – 2015-02-06 (×2): via INTRAVENOUS

## 2015-02-05 MED ORDER — MORPHINE SULFATE 2 MG/ML IJ SOLN
1.0000 mg | Freq: Once | INTRAMUSCULAR | Status: AC
Start: 2015-02-05 — End: 2015-02-05
  Administered 2015-02-05: 1 mg via INTRAVENOUS
  Filled 2015-02-05: qty 1

## 2015-02-05 MED ORDER — DEXTROSE 5 % IV SOLN
1.0000 g | INTRAVENOUS | Status: DC
  Administered 2015-02-05: 1 g via INTRAVENOUS
  Filled 2015-02-05 (×2): qty 1

## 2015-02-05 MED ORDER — LORAZEPAM 2 MG/ML IJ SOLN
0.5000 mg | Freq: Once | INTRAMUSCULAR | Status: AC
Start: 2015-02-05 — End: 2015-02-05
  Administered 2015-02-05: 0.5 mg via INTRAVENOUS
  Filled 2015-02-05: qty 1

## 2015-02-05 MED ORDER — ENOXAPARIN SODIUM 30 MG/0.3ML ~~LOC~~ SOLN: 30.0000 mg | SUBCUTANEOUS | Status: DC

## 2015-02-05 MED ORDER — SODIUM CHLORIDE 0.9 % IV BOLUS (SEPSIS)
1000.0000 mL | Freq: Once | INTRAVENOUS | Status: AC
  Administered 2015-02-05: 1000 mL via INTRAVENOUS

## 2015-02-05 MED ORDER — VANCOMYCIN HCL IN DEXTROSE 750-5 MG/150ML-% IV SOLN
750.0000 mg | INTRAVENOUS | Status: DC
  Administered 2015-02-05: 750 mg via INTRAVENOUS
  Filled 2015-02-05 (×2): qty 150

## 2015-02-05 MED ORDER — IPRATROPIUM-ALBUTEROL 0.5-2.5 (3) MG/3ML IN SOLN
3.0000 mL | Freq: Four times a day (QID) | RESPIRATORY_TRACT | Status: DC
  Administered 2015-02-05 – 2015-02-06 (×4): 3 mL via RESPIRATORY_TRACT
  Filled 2015-02-05 (×4): qty 3

## 2015-02-05 NOTE — Progress Notes (Signed)
Patient noted to be lethargic. Vital signs done showed oxygen level at 42%. A non re-breather mask was applied and oxygen level came up to low 90"s and then dropped to the low 80's. Blood pressure was in the 80's, HR 120's and respiration in the 30's. Rapid Response was called along with NP Lynch to further assess patient. Patient's vital signs did not stabilize and decision was made to transfer patient to a higher level of care. Patient left the unit at approximately [phone] for room 1222. V/S prior to transfer was HR 124, B/P 97/53, RR 36, o2 sats 85%. Spoke with spouse at approximately 0628 re- patient's deteriorating condition who stated she was on her way here. Spouse arrived approximately 0710 and was accompanied to the ICU to see her spouse.

## 2015-02-05 NOTE — Progress Notes (Signed)
Chaplain was rounding when nurses recommended a visit to Mr Dustin Vance's room. Chaplain met with family members who were at the bedside. After greetings the family said they were fine and that their faith communities would handle any spiritual matters. Chaplain spoke to the patient who was under heave sedation and departed.  Sallee Lange. Skarlet Lyons, [place]

## 2015-02-05 NOTE — Progress Notes (Signed)
[place] Progress Note Patient Name: Dustin Vance DOB: [birth year] MRN: [id number]   Date of Service  02/05/2015  HPI/Events of Note  SBP = 80's.  eICU Interventions  Will bolus with 0.9 NaCl 1 liter over 1 hour now.      Intervention Category Intermediate Interventions: Hypotension - evaluation and management  Lysle Dingwall 02/05/2015, 8:58 PM

## 2015-02-05 NOTE — Significant Event (Signed)
Rapid Response Event Note  Overview: Time Called: 0605 Arrival Time: 0610 Event Type: Respiratory, Hypotension  Initial Focused Assessment: Pt lying in bed, obtunded, occasional moans heard with stimulation, purposeful movement noted in both upper arms, shallow labored respirations, RR-42-48, lungs diminshed throughout with friction rub heard on left lower side, bedside RN advised low BP prior to my arrival SBP in 80's, current BP on RRT monitor is stable, ECG pattern ST rate 130s, pt's skin color grayish.  Interventions: Mary Lynch,NP at bedside on my arrival, due to previous BP being low, IVF increased to 128m/hr, ABG ordered, CBG done and WNL, due to pt's  declining respiratory status pt emergently transferred to ICU/SD unit via bed, once in ICU/SD unit, BIPAP ordered by MWalden Fieldand applied by RT.  Within 15 minutes sats increased to mid-90's, but remains labored with RR-33-36.  At [address]. LDarrick Meigspresent at bedside talking with wife who is also at bedside.  Event Summary: Name of Physician Notified: MOverton Mamat [phone] Outcome: Transferred (Comment) (transferred to ICU/SD Unit)  Event End Time: 0Ben HillRapid Response RN

## 2015-02-05 NOTE — Progress Notes (Addendum)
[facility] PROGRESS NOTE  TERYN GUST STM:[id number] DOB: 1933-02-01 DOA: 01/30/2015 PCP: Joycelyn Man, MD  Assessment/Plan:  1. Dyspnea- ? cause, chest x-ray does not show infiltrate. Shows mild pulmonary edema. Patient has a history of DVTs the past, was on Coumadin which was stopped in January. Likely he has pulmonary embolism. Discussed with pulmonary Dr. Alva Garnet, who agreed with possible diagnosis of PE. Unfortunately due to the renal insufficiency will not be able to obtain CT angiogram. Will obtain bilateral venous lower extremity Dopplers, heparin per pharmacy consultation. 2. Elevated troponin- patient has mild elevation of troponin 0.26, A from demandischemia from tachycardia. Will cycle the troponins. Heparin started. Patient is not a good candidate for any aggressive intervention at this time.EKG shows sinus tachycardia. 3. Neutropenia- patient's white count has come down from 6.7 to 0.4. Platelet count has come down from 106,000 to  31,000.likely secondary to chemotherapy.  4. Pulmonary edema- Lasix was given yesterday with good diuretic response.  5. Lethargy, failure to thrive- due to chemotherapy and lung cancer. Nutrition consulted for nutritional assessment. Family requesting appetite stimulant. Megace was started in the hospital yesterday. 6. Lung cancer- patient currently on chemotherapy, called and discussed with oncology. Will follow oncology as outpatient. 7. Diabetes mellitus- will hold Levemir at this time, initiate sliding scale insulin with NovoLog 8. COPD-  Continue duo nebs when necessary   Code Status: Full code Family Communication: discussed with patient's daughter and wife at bedside, discussed the poor prognosis with worsening respiratory failure. At this time family does not want any aggressive interventions, they all agreed to make patient DO NOT RESUSCITATE. Disposition Plan: To be  decided   Consultants:  None  Procedures:  None  Antibiotics:  None  HPI/Subjective: 81 year old male with a history of lung cancer diagnosed March 2016 had first round on chemotherapy on April 13, 14, 15. Patient was brought to the ED for or appetite and poor by mouth intake of fluids. Patient received Neulasta 2 days ago. Patient found to be hypernatremic with  Dehydration and started on IV fluids.  This morning patient was found to be in respiratory distress with hypotension, patient was tachycardic, tachypneic. Patient was transferred to the stepdown unit and started on BiPAP. Currently patient is tachypneic with labored breathing respiration in 30s per minute. Heart rate in 130s  Objective: Filed Vitals:   02/05/15 0901  BP:   Pulse:   Temp: 98.3 F (36.8 C)  Resp:     Intake/Output Summary (Last 24 hours) at 02/05/15 1005 Last data filed at 02/05/15 0900  Gross per 24 hour  Intake    410 ml  Output   1100 ml  Net   -690 ml   Filed Weights   02/11/2015 2119 02/05/15 0610  Weight: 61.3 kg (135 lb 2.3 oz) 61.3 kg (135 lb 2.3 oz)    Exam:   General:  obtunded, on BiPAP  Cardiovascular: S1-S2 is regular  Respiratory: Bilateral crackles  Abdomen: Soft, nontender, no organomegaly  Musculoskeletal: No edema noted in the lower extremities   Data Reviewed: Basic Metabolic Panel:  Recent Labs Lab 01/31/15 1052 01/19/2015 1839 02/02/15 0355 02/03/15 0430 02/04/15 0919 02/05/15 0355  NA 141 149* 149* 135  --  147*  K 4.7 3.9 3.8 3.6  --  3.9  CL  --  117* 120* 108  --  115*  CO2 13* '21 21 20  '$ --  24  GLUCOSE 399* 102* 235* 364* 511* 104*  BUN 36.2* 49* 46* 31*  --  62*  CREATININE 1.6* 1.67* 1.46* 1.09  --  2.36*  CALCIUM 9.2 8.5 8.2* 7.5*  --  8.5   Liver Function Tests:  Recent Labs Lab 01/31/15 1052  AST 43*  ALT 53  ALKPHOS 221*  BILITOT 0.48  PROT 7.2  ALBUMIN 2.7*   No results for input(s): LIPASE, AMYLASE in the last 168 hours. No  results for input(s): AMMONIA in the last 168 hours. CBC:  Recent Labs Lab 01/31/15 1052 01/14/2015 1839 02/02/15 0355 02/05/15 0330  WBC 8.0 12.0* 6.7 0.4*  NEUTROABS 7.2* 11.3*  --  0.0*  HGB 12.1* 11.6* 10.9* 10.9*  HCT 38.0* 36.7* 34.4* 33.6*  MCV 94.8 94.6 95.6 93.9  PLT 204 142* 106* 31*   Cardiac Enzymes:  Recent Labs Lab 02/05/15 0831  TROPONINI 0.26*   BNP (last 3 results) No results for input(s): BNP in the last 8760 hours.  ProBNP (last 3 results) No results for input(s): PROBNP in the last 8760 hours.  CBG:  Recent Labs Lab 02/04/15 1222 02/04/15 1640 02/04/15 2028 02/05/15 0622 02/05/15 0849  GLUCAP 357* 186* 100* 72 92    Recent Results (from the past 240 hour(s))  Urine Culture     Status: None   Collection Time: 01/31/15 10:53 AM  Result Value Ref Range Status   Urine Culture, Routine Culture, Urine  Final    Comment: Final - ===== COLONY COUNT: ===== 9,000 COLONIES/ML Insignificant Growth      Studies: Dg Chest Port 1 View  02/05/2015   CLINICAL DATA:  Short of breath.  Hypoxia.  EXAM: PORTABLE CHEST - 1 VIEW  COMPARISON:  PET-CT, 01/25/2015.  Chest radiograph, 12/28/2014.  FINDINGS: Lungs show bilateral irregular interstitial thickening consistent with fibrosis. No focal lung consolidation is seen. There is no pulmonary edema. No pleural effusion or pneumothorax.  Cardiac silhouette is normal in size. No mediastinal or hilar masses.  IMPRESSION: No acute cardiopulmonary disease. Stable changes of pulmonary fibrosis.   Electronically Signed   By: Lajean Manes M.D.   On: 02/05/2015 07:24    Scheduled Meds: . antiseptic oral rinse  7 mL Mouth Rinse q12n4p  . aspirin EC  81 mg Oral Daily  . atorvastatin  10 mg Oral Q M,W,F  . ceFEPime (MAXIPIME) IV  1 g Intravenous Q24H  . chlorhexidine  15 mL Mouth Rinse BID  . donepezil  10 mg Oral QHS  . enoxaparin (LOVENOX) injection  30 mg Subcutaneous Q24H  . feeding supplement (RESOURCE BREEZE)  1  Container Oral TID BM  . guaiFENesin  600 mg Oral BID  . insulin aspart  0-9 Units Subcutaneous TID WC  . insulin detemir  15 Units Subcutaneous Daily  . loratadine  10 mg Oral Daily  . megestrol  400 mg Oral BID  . mirtazapine  30 mg Oral QHS  . multivitamin with minerals  1 tablet Oral Daily  . sodium chloride  3 mL Intravenous Q12H  . vancomycin  750 mg Intravenous Q24H   Continuous Infusions: . dextrose 50 mL/hr at 02/05/15 1001    Principal Problem:   Dehydration Active Problems:   Essential hypertension   COPD (chronic obstructive pulmonary disease)   Oropharynx cancer   Lung cancer   AKI (acute kidney injury)   Adult failure to thrive syndrome   Hypernatremia   Malnutrition of moderate degree    Time spent: 25 min    [facility]  [facility] Pager [phone]*. If 7PM-7AM, please contact night-coverage at [email or web address], password  [email or web address] 02/05/2015, 10:05 AM  LOS: 4 days

## 2015-02-05 NOTE — Consult Note (Signed)
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 Transferred to ICU 4/23 with severe respiratory distress and NPPV initiated. Made DNR by Dr Darrick Meigs on morning of 4/23. PCCM asked to assist in eval and mgmt of resp distress      HPI:  Pt unable to provide history. I have reviewed pt's initial presentation, consultants notes and hospital database in detail.    Past Medical History  Diagnosis Date  . Hyperlipidemia   . AAA (abdominal aortic aneurysm)     status post repair in 2005  . Hx of colonic polyps   . CKD (chronic kidney disease)     last creatinine   . Oropharynx cancer   . Cancer 04/28/10    R base of tongue, inv squamous cell, HPV +  . Diabetes mellitus type I   . Hypertension   . COPD (chronic obstructive pulmonary disease)   . Hx of radiation therapy 06/01/10 to 07/21/10    R base of tongue  . Anemia     mild/chemo-induced  . DVT (deep venous thrombosis)     MEDICATIONS: reviewed  History   Social History  . Marital Status: Married    Spouse Name: N/A  . Number of Children: 3  . Years of Education: N/A   Occupational History  . Retired    Social History Main Topics  . Smoking status: Former Smoker -- 1.00 packs/day for 50 years    Types: Cigarettes    Quit date: 01/29/2009  . Smokeless tobacco: Never Used  . Alcohol Use: No  . Drug Use: No  . Sexual Activity: Not on file   Other Topics Concern  . Not on file   Social History Narrative    Family History  Problem Relation Age of Onset  . Cancer Other   . Stroke Other   . Cancer Mother     unknown type  . Cancer Sister     lung  . Cancer Sister    brain  . Heart disease Father     Before age 30  . Heart attack Father   . Hypertension Father     ROS N/A due to AMS  Filed Vitals:   02/05/15 0900 02/05/15 0901 02/05/15 1000 02/05/15 1100  BP: 112/84  110/68 85/58  Pulse:   35   Temp:  98.3 F (36.8 C)    TempSrc:      Resp: 36  35 34  Height:      Weight:      SpO2: 98%  92% 94%    EXAM:  Gen: elderly, frail, tachypneic, RASS -2 HEENT: temporal wasting, NCAT Neck: No JVD, no LAN  Lungs: bibasilar crackles, bronchial BS in R base Cardiovascular: tachy, reg, no M Abdomen: scaphoid, soft, NT Ext: muscle atrophy, no edema, cool, diminished DP pulses Neuro: Cns grossly intact, MAEs, RASS -2   DATA:  BMET    Component Value Date/Time   NA 147* 02/05/2015 0355   NA 141 01/31/2015 1052   NA 141 09/25/2011 0947   K 3.9 02/05/2015 0355   K 4.7 01/31/2015 1052   K 4.9* 09/25/2011 0947   CL 115* 02/05/2015 0355   CL 108* 03/30/2013 1037  CL 106 09/25/2011 0947   CO2 24 02/05/2015 0355   CO2 13* 01/31/2015 1052   CO2 29 09/25/2011 0947   GLUCOSE 104* 02/05/2015 0355   GLUCOSE 399* 01/31/2015 1052   GLUCOSE 161* 03/30/2013 1037   GLUCOSE 353* 09/25/2011 0947   BUN 62* 02/05/2015 0355   BUN 36.2* 01/31/2015 1052   BUN 19 09/25/2011 0947   CREATININE 2.36* 02/05/2015 0355   CREATININE 1.6* 01/31/2015 1052   CREATININE 1.5* 09/25/2011 0947   CALCIUM 8.5 02/05/2015 0355   CALCIUM 9.2 01/31/2015 1052   CALCIUM 8.3 09/25/2011 0947   GFRNONAA 25* 02/05/2015 0355   GFRAA 28* 02/05/2015 0355    ABG    Component Value Date/Time   PHART 7.406 02/05/2015 1235   PCO2ART 33.0* 02/05/2015 1235   PO2ART 103.0* 02/05/2015 1235   HCO3 20.3 02/05/2015 1235   TCO2 18.9 02/05/2015 1235   ACIDBASEDEF 3.3* 02/05/2015 1235   O2SAT 97.2 02/05/2015 1235   CBC    Component Value Date/Time   WBC 0.4* 02/05/2015 0330   WBC 8.0 01/31/2015 1052   RBC 3.58* 02/05/2015 0330   RBC 4.01* 01/31/2015 1052   HGB 10.9* 02/05/2015  0330   HGB 12.1* 01/31/2015 1052   HCT 33.6* 02/05/2015 0330   HCT 38.0* 01/31/2015 1052   PLT 31* 02/05/2015 0330   PLT 204 01/31/2015 1052   MCV 93.9 02/05/2015 0330   MCV 94.8 01/31/2015 1052   MCH 30.4 02/05/2015 0330   MCH 30.3 01/31/2015 1052   MCHC 32.4 02/05/2015 0330   MCHC 32.0 01/31/2015 1052   RDW 14.3 02/05/2015 0330   RDW 15.5* 01/31/2015 1052   LYMPHSABS 0.4* 02/05/2015 0330   LYMPHSABS 0.7* 01/31/2015 1052   MONOABS 0.0* 02/05/2015 0330   MONOABS 0.0* 01/31/2015 1052   EOSABS 0.0 02/05/2015 0330   EOSABS 0.1 01/31/2015 1052   BASOSABS 0.0 02/05/2015 0330   BASOSABS 0.0 01/31/2015 1052     CXR: generalized mild interstitial prominence. No discrete consolidation  IMPRESSION:   1) Recent dx of metastatic small cell ca 2) Severe overall decline in health status in past 4 months 3) Cachexia 4) Prior dx of COPD - no acute bronchospasm noted 5) Mod emphysema by CT chest 6) Nonspecific subpleural fibrosis by CT chest  7) Prior dx of DVT 8) Prior oropharyngeal cancer 9) Acute onset of severe resp distress 4/23 - ddx includes PE, PNA, pulm edema, AECOPD  No wheezing, CXR not c/w edema  Exam findings suggest RLL consolidation but not evident on current CXR   Empiric abx initiated by Dr Darrick Meigs  Too unstable for V/Q scan  AKI prohibits CTA chest 10) New onset thrombocytopenia - likely due to recent chem  Could represent consumptive process as well  Sugnificant blee 9) Made DNR this AM by Dr Darrick Meigs  PLAN:  1) I strongly concur with DNR decision and expressed my support for this decision to family 2) Discussed empiric anticoagulation using UFH with Dr Darrick Meigs but in light thrombocytopenia, would prefer to hold off unless a definitive dx of VTE is made  3) Agree with empiric abx as ordered 4) Repeat CXR AM 4/24 5) LE venous duplex scans ordered 6) Cont PRN NPPV as long as it is providing more more palliation than creating discomfort 7) Agree with low dose hydromorphone  as needed to relieve dyspnea   Merton Border, MD ; [facility] service [place] [phone].  After 5:30 PM or weekends, call [phone]

## 2015-02-05 NOTE — Progress Notes (Signed)
Pt is DNR, spoke with E-LINK,MD r/t pt low B/P and increased R/R. New orders received and initiated.

## 2015-02-05 NOTE — Progress Notes (Signed)
*  Preliminary Results* Bilateral lower extremity venous duplex completed. Bilateral lower extremities are negative for deep vein thrombosis. There is no evidence of Baker's cyst bilaterally.  02/05/2015  Maudry Mayhew, RVT, RDCS, RDMS

## 2015-02-05 NOTE — Progress Notes (Signed)
ANTIBIOTIC CONSULT NOTE - INITIAL  Pharmacy Consult for vancomycin, cefepime Indication: pneumonia  No Known Allergies  Patient Measurements: Height: '5\' 11"'$  (180.3 cm) Weight: 135 lb 2.3 oz (61.3 kg) IBW/kg (Calculated) : 75.3  Vital Signs: Temp: 99.1 F (37.3 C) (04/23 0605) Temp Source: Oral (04/23 0605) BP: 80/42 mmHg (04/23 0605) Pulse Rate: 133 (04/23 0648) Intake/Output from previous day: 04/22 0701 - 04/23 0700 In: 120 [P.O.:120] Out: 1100 [Urine:1100] Intake/Output from this shift:    Labs:  Recent Labs  02/03/15 0430 02/05/15 0330 02/05/15 0355  WBC  --  0.4*  --   HGB  --  10.9*  --   PLT  --  31*  --   CREATININE 1.09  --  2.36*   Estimated Creatinine Clearance: 22 mL/min (by C-G formula based on Cr of 2.36). No results for input(s): VANCOTROUGH, VANCOPEAK, VANCORANDOM, GENTTROUGH, GENTPEAK, GENTRANDOM, TOBRATROUGH, TOBRAPEAK, TOBRARND, AMIKACINPEAK, AMIKACINTROU, AMIKACIN in the last 72 hours.   Microbiology: Recent Results (from the past 720 hour(s))  Urine Culture     Status: None   Collection Time: 01/31/15 10:53 AM  Result Value Ref Range Status   Urine Culture, Routine Culture, Urine  Final    Comment: Final - ===== COLONY COUNT: ===== 9,000 COLONIES/ML Insignificant Growth     Medical History: Past Medical History  Diagnosis Date  . Hyperlipidemia   . AAA (abdominal aortic aneurysm)     status post repair in 2005  . Hx of colonic polyps   . CKD (chronic kidney disease)     last creatinine   . Oropharynx cancer   . Cancer 04/28/10    R base of tongue, inv squamous cell, HPV +  . Diabetes mellitus type I   . Hypertension   . COPD (chronic obstructive pulmonary disease)   . Hx of radiation therapy 06/01/10 to 07/21/10    R base of tongue  . Anemia     mild/chemo-induced  . DVT (deep venous thrombosis)     Medications:  Scheduled:  . antiseptic oral rinse  7 mL Mouth Rinse q12n4p  . aspirin EC  81 mg Oral Daily  . atorvastatin   10 mg Oral Q M,W,F  . chlorhexidine  15 mL Mouth Rinse BID  . donepezil  10 mg Oral QHS  . enoxaparin (LOVENOX) injection  30 mg Subcutaneous Q24H  . feeding supplement (RESOURCE BREEZE)  1 Container Oral TID BM  . guaiFENesin  600 mg Oral BID  . insulin aspart  0-9 Units Subcutaneous TID WC  . insulin detemir  15 Units Subcutaneous Daily  . loratadine  10 mg Oral Daily  . LORazepam  0.5 mg Intravenous Once  . megestrol  400 mg Oral BID  . mirtazapine  30 mg Oral QHS  . multivitamin with minerals  1 tablet Oral Daily  . sodium chloride  3 mL Intravenous Q12H   Infusions:     Assessment: 81 yo male with lung cancer with cycle1 of carbo/etoposide completed 4/13-4/15 and cycle 2 Day 1 due 5/4  presented to ED on 4/19 with complaint of anorexia, poor intake of food/liquids. Patient had Neulasta yesterday. Other PMH includes DM1, CKD, HTN, COPD, hx DVT, HLP. Note on Levemir 30 units qhs PTA for DM1. Patient transferred to ICU 4/23 and to start vancomycin and cefepime for possible HAP  4/23 >> cefepime >> 4/23 >> vancomycin >>  Goal of Therapy:  Vancomycin trough level 15-20 mcg/ml  Plan:  1) Vancomycin '750mg'$  IV q24 per  current weight and renal function 2) Cefepime 1g IV q24 for CrCl 11-29 ml/min   Adrian Saran, PharmD, BCPS Pager [phone] 02/05/2015 7:56 AM

## 2015-02-06 ENCOUNTER — Inpatient Hospital Stay (HOSPITAL_COMMUNITY): Payer: Medicare HMO

## 2015-02-06 LAB — CBC
HCT: 25.9 % — ABNORMAL LOW (ref 39.0–52.0)
Hemoglobin: 8.3 g/dL — ABNORMAL LOW (ref 13.0–17.0)
MCH: 30.7 pg (ref 26.0–34.0)
MCHC: 32 g/dL (ref 30.0–36.0)
MCV: 95.9 fL (ref 78.0–100.0)
PLATELETS: 7 10*3/uL — AB (ref 150–400)
RBC: 2.7 MIL/uL — ABNORMAL LOW (ref 4.22–5.81)
RDW: 14.8 % (ref 11.5–15.5)
WBC: 0.1 10*3/uL — AB (ref 4.0–10.5)

## 2015-02-06 LAB — BASIC METABOLIC PANEL
Anion gap: 12 (ref 5–15)
BUN: 85 mg/dL — AB (ref 6–23)
CO2: 18 mmol/L — ABNORMAL LOW (ref 19–32)
Calcium: 7.5 mg/dL — ABNORMAL LOW (ref 8.4–10.5)
Chloride: 114 mmol/L — ABNORMAL HIGH (ref 96–112)
Creatinine, Ser: 4.29 mg/dL — ABNORMAL HIGH (ref 0.50–1.35)
GFR calc non Af Amer: 12 mL/min — ABNORMAL LOW (ref >90)
GFR, EST AFRICAN AMERICAN: 14 mL/min — AB (ref >90)
Glucose, Bld: 447 mg/dL — ABNORMAL HIGH (ref 70–99)
POTASSIUM: 5.1 mmol/L (ref 3.5–5.1)
Sodium: 144 mmol/L (ref 135–145)

## 2015-02-06 LAB — GLUCOSE, CAPILLARY
GLUCOSE-CAPILLARY: 326 mg/dL — AB (ref 70–99)
Glucose-Capillary: 480 mg/dL — ABNORMAL HIGH (ref 70–99)

## 2015-02-06 MED ORDER — SODIUM CHLORIDE 0.9 % IV BOLUS (SEPSIS)
500.0000 mL | Freq: Once | INTRAVENOUS | Status: AC
  Administered 2015-02-06: 500 mL via INTRAVENOUS

## 2015-02-06 MED ORDER — HYDROMORPHONE HCL 1 MG/ML IJ SOLN
0.5000 mg | INTRAMUSCULAR | Status: DC | PRN
  Administered 2015-02-06: 0.5 mg via INTRAVENOUS
  Filled 2015-02-06: qty 1

## 2015-02-06 MED ORDER — SODIUM CHLORIDE 0.9 % IV SOLN
0.5000 mg/h | INTRAVENOUS | Status: DC
  Administered 2015-02-06: 1 mg/h via INTRAVENOUS
  Filled 2015-02-06: qty 2.5

## 2015-02-07 ENCOUNTER — Ambulatory Visit: Payer: Medicare HMO | Admitting: Neurology

## 2015-02-09 ENCOUNTER — Other Ambulatory Visit: Payer: Medicare HMO

## 2015-02-13 NOTE — Progress Notes (Signed)
Morgue care done  Per protocol.  Patient taken t morgue at 1545.  Haydon Dorris Roselie Awkward RN

## 2015-02-13 NOTE — Progress Notes (Signed)
[facility] PROGRESS NOTE  Dustin Vance ZSW:[id number] DOB: [birth year] DOA: 01/15/2015 PCP: Joycelyn Man, MD  Assessment/Plan:  1. Acute respiratory failure-  cause, chest x-ray does not show infiltrate. Shows mild pulmonary edema. Patient has a history of DVTs the past, was on Coumadin which was stopped in January. Likely he has pulmonary embolism. Discussed with pulmonary Dr. Alva Garnet, who agreed with possible diagnosis of PE. Unfortunately due to the renal insufficiency will not be able to obtain CT angiogram. Bilateral venous lower extremity Dopplers were negative for DVT, heparin not started due to thrombocytopenia. 2. Elevated troponin- patient has mild elevation of troponin 0.26, A from demandischemia from tachycardia. Will cycle the troponins. Heparin started. Patient is not a good candidate for any aggressive intervention at this time.EKG shows sinus tachycardia. 3. AKI- Creatinine is worse today 4.29 4. Neutropenia- patient's white count has come down from 0.4 - 0.1 today Platelet count has come down from   31,000 to 7000, secondary to chemotherapy.  5. Pulmonary edema- Lasix was given yesterday with good diuretic response.  6. Lethargy, failure to thrive- due to chemotherapy and lung cancer. Nutrition consulted for nutritional assessment. Family requesting appetite stimulant. Megace was started in the hospital yesterday. 7. Lung cancer- patient currently on chemotherapy, called and discussed with oncology.  8. Diabetes mellitus- will hold Levemir at this time, initiate sliding scale insulin with NovoLog 9. COPD-  Continue duo nebs when necessary 10. Goals of care- Discussed with family in detail, patient has very poor prognoses, with  Worsening neutropenia, persistent hypotension, respiratory failure, worsening renal failure, worsening mental status. His chances of any meaningful recovery are almost negligible. Family understands the situation and want comfort care.Will  start Dilaudid infusion 0.5 mg/hr, titrate to comfort.    Code Status: Full code Family Communication: discussed with patient's daughter and wife at bedside, discussed the poor prognosis with worsening respiratory failure. At this time family does not want any aggressive interventions, they all agreed to make patient DO NOT RESUSCITATE. Disposition Plan: Anticipate hospital death   Consultants:  None  Procedures:  None  Antibiotics:  None  HPI/Subjective: 81 year old male with a history of lung cancer diagnosed March 2016 had first round on chemotherapy on April 13, 14, 15. Patient was brought to the ED for or appetite and poor by mouth intake of fluids. Patient received Neulasta 2 days ago. Patient found to be hypernatremic with  Dehydration and started on IV fluids.  This morning patient continues to be in mild respiratory distress. On bipap, family at bedside, hypotensive.  Objective: Filed Vitals:   03/03/15 0821  BP:   Pulse:   Temp: 99.8 F (37.7 C)  Resp:     Intake/Output Summary (Last 24 hours) at 03-03-15 0939 Last data filed at Mar 03, 2015 0825  Gross per 24 hour  Intake   2802 ml  Output    200 ml  Net   2602 ml   Filed Weights   01/28/2015 2119 02/05/15 0610  Weight: 61.3 kg (135 lb 2.3 oz) 61.3 kg (135 lb 2.3 oz)    Exam:   General:  obtunded, on BiPAP  Cardiovascular: S1-S2 is regular  Respiratory: Decreased breath sounds bilaterally  Abdomen: Soft, nontender, no organomegaly  Musculoskeletal: No edema noted in the lower extremities   Data Reviewed: Basic Metabolic Panel:  Recent Labs Lab 01/15/2015 1839 02/02/15 0355 02/03/15 0430 02/04/15 0919 02/05/15 0355 2015-03-03 0400  NA 149* 149* 135  --  147* 144  K 3.9 3.8 3.6  --  3.9 5.1  CL 117* 120* 108  --  115* 114*  CO2 '21 21 20  '$ --  24 18*  GLUCOSE 102* 235* 364* 511* 104* 447*  BUN 49* 46* 31*  --  62* 85*  CREATININE 1.67* 1.46* 1.09  --  2.36* 4.29*  CALCIUM 8.5 8.2* 7.5*  --   8.5 7.5*   Liver Function Tests:  Recent Labs Lab 01/31/15 1052  AST 43*  ALT 53  ALKPHOS 221*  BILITOT 0.48  PROT 7.2  ALBUMIN 2.7*   No results for input(s): LIPASE, AMYLASE in the last 168 hours. No results for input(s): AMMONIA in the last 168 hours. CBC:  Recent Labs Lab 01/31/15 1052 01/31/2015 1839 02/02/15 0355 02/05/15 0330 Feb 24, 2015 0400  WBC 8.0 12.0* 6.7 0.4* 0.1*  NEUTROABS 7.2* 11.3*  --  0.0*  --   HGB 12.1* 11.6* 10.9* 10.9* 8.3*  HCT 38.0* 36.7* 34.4* 33.6* 25.9*  MCV 94.8 94.6 95.6 93.9 95.9  PLT 204 142* 106* 31* 7*   Cardiac Enzymes:  Recent Labs Lab 02/05/15 0831  TROPONINI 0.26*   BNP (last 3 results) No results for input(s): BNP in the last 8760 hours.  ProBNP (last 3 results) No results for input(s): PROBNP in the last 8760 hours.  CBG:  Recent Labs Lab 02/05/15 0849 02/05/15 1207 02/05/15 1747 02/24/15 0018 24-Feb-2015 0703  GLUCAP 92 129* 198* 326* 480*    Recent Results (from the past 240 hour(s))  Urine Culture     Status: None   Collection Time: 01/31/15 10:53 AM  Result Value Ref Range Status   Urine Culture, Routine Culture, Urine  Final    Comment: Final - ===== COLONY COUNT: ===== 9,000 COLONIES/ML Insignificant Growth      Studies: Dg Chest Port 1 View  02/05/2015   CLINICAL DATA:  Short of breath.  Hypoxia.  EXAM: PORTABLE CHEST - 1 VIEW  COMPARISON:  PET-CT, 01/25/2015.  Chest radiograph, 12/28/2014.  FINDINGS: Lungs show bilateral irregular interstitial thickening consistent with fibrosis. No focal lung consolidation is seen. There is no pulmonary edema. No pleural effusion or pneumothorax.  Cardiac silhouette is normal in size. No mediastinal or hilar masses.  IMPRESSION: No acute cardiopulmonary disease. Stable changes of pulmonary fibrosis.   Electronically Signed   By: Lajean Manes M.D.   On: 02/05/2015 07:24    Scheduled Meds: . antiseptic oral rinse  7 mL Mouth Rinse q12n4p  . chlorhexidine  15 mL Mouth  Rinse BID  . ipratropium-albuterol  3 mL Nebulization Q6H  . sodium chloride  3 mL Intravenous Q12H   Continuous Infusions: . dextrose 50 mL/hr at 02-24-2015 0825  . HYDROmorphone 1 mg/hr (Feb 24, 2015 0825)    Principal Problem:   Dehydration Active Problems:   Essential hypertension   COPD (chronic obstructive pulmonary disease)   Oropharynx cancer   Lung cancer   AKI (acute kidney injury)   Adult failure to thrive syndrome   Hypernatremia   Malnutrition of moderate degree   Respiratory failure, acute    Time spent: 25 min    [facility] Pager [phone]*. If 7PM-7AM, please contact night-coverage at [email or web address], password [facility] 02-24-2015, 9:39 AM  LOS: 5 days

## 2015-02-13 NOTE — Progress Notes (Signed)
[place] Progress Note Patient Name: Dustin Vance DOB: [birth year] MRN: [id number]   Date of Service  02-25-2015  HPI/Events of Note  Hypotension with associated oliguria in the setting of pain and resp distress treated with dilaudid.  Had received 1 liter of NS earlier.  eICU Interventions  Will give 500 cc bolus of NS now for BP support but am concerned that ongoing fluid boluses will only worsen resp status.  Focus should be on patient comfort.     Intervention Category Intermediate Interventions: Hypotension - evaluation and management  DETERDING,ELIZABETH Feb 25, 2015, 2:40 AM

## 2015-02-13 NOTE — Progress Notes (Signed)
CRITICAL VALUE ALERT  Critical value received:  Platelet 7  Date of notification:  03-05-15  Time of notification:  0530 am  Critical value read back: yes  Nurse who received alert:  Dyann Ruddle, RN  MD notified (1st page):  Walden Field, NP  Time of first page:  0600  MD notified (2nd page):  Time of second page:  Responding MD:    Time MD responded:

## 2015-02-13 NOTE — Progress Notes (Signed)
[place] Mr. Whitehouse with no spont respirations, no ausculated heart sounds, no palpable pulse present.  Pronounced per 2 RN's Shadae Reino Roselie Awkward RN and Jacklynn Lewis RN.  Family at bedside.

## 2015-02-13 NOTE — Progress Notes (Signed)
Pt appropriately made full comfort care this AM. I have offered my sympathies to the family PCCM will sign off. Please call if we can be of further assistance  Merton Border, MD ; [facility] [phone].  After 5:30 PM or weekends, call [phone]

## 2015-02-13 NOTE — Progress Notes (Signed)
Chaplain paged to provide spiritual and emotional comfort to the family of Dustin Vance. [place]. Chaplain present when Mr Demmer died at or about 49. Comfort and grief care provided. Prayer given for the family in the midst of their grief. Family pleased with the care provided by staff during this difficult time.  Sallee Lange. Shanesha Bednarz, DMin, MDiv, MA Chaplain

## 2015-02-13 NOTE — Progress Notes (Signed)
Pt unable to follow any commands, family at the bedside.  Pt family struggled with decisions to make pt comfort care.

## 2015-02-13 NOTE — Progress Notes (Signed)
[facility] made aware of elevated CBG, no new orders.  Will continue to monitor.

## 2015-02-13 NOTE — Progress Notes (Signed)
Wasted Dilaudid drip of 22cc down sink, observed per Lacinda Axon RN and Jacklynn Lewis RN.

## 2015-02-13 DEATH — deceased

## 2015-02-16 ENCOUNTER — Other Ambulatory Visit: Payer: Medicare HMO

## 2015-02-18 ENCOUNTER — Ambulatory Visit: Payer: Medicare HMO

## 2015-02-19 ENCOUNTER — Ambulatory Visit: Payer: Medicare HMO

## 2015-03-16 NOTE — Discharge Summary (Addendum)
Death Summary  Dustin Vance:[id number] DOB: 1932-12-17 DOA: 2015-02-21  PCP: Joycelyn Man, MD   Admit date: 02/21/15 Date of Death: 2015/02/26  Final Diagnoses:  Principal Problem:   Dehydration Active Problems:   Essential hypertension   COPD (chronic obstructive pulmonary disease)   Oropharynx cancer   Lung cancer   AKI (acute kidney injury)   Adult failure to thrive syndrome   Hypernatremia   Malnutrition of moderate degree   Respiratory failure, acute     History of present illness:  81 y.o. male with a history of Lung Cancer diagnosed 12/2014 who had his first round of chemotherapy on 04/13, 04/14, and 04/15 who was brought tot he ED due to poor appetite and no intake of foods or liquids for the past 5 days. He had a Neulasta injection yesterday. He has had increased weakness and could not get out of bed today and was advised to go to the ED. He denies any fevers or chills or nausea or vomiting, but his family report that he has had loose stools.   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    Hospital Course:   1. Acute respiratory failure- cause, chest x-ray does not show infiltrate. Shows mild pulmonary edema. Patient has a history of DVTs the past, was on Coumadin which was stopped in January. Likely he has pulmonary embolism. Discussed with pulmonary Dr. Alva Garnet, who agreed with possible diagnosis of PE. Unfortunately due to the renal insufficiency will not be able to obtain CT angiogram. Bilateral venous lower extremity Dopplers were negative for DVT, heparin not started due to thrombocytopenia. 2. Elevated troponin- patient has mild elevation of troponin 0.26, A from demandischemia from tachycardia. Patient is not a good candidate for any aggressive intervention at this time.EKG shows sinus tachycardia. 3. AKI- Patient developed AKI  with cr 4.29 4. Neutropenia- patient's white count came  down from 0.4 - 0.1 today Platelet count  came down from 31,000 to 7000, secondary to chemotherapy.  5. Pulmonary edema- Lasix was given yesterday with good diuretic response.  6. Lethargy, failure to thrive- due to chemotherapy and lung cancer. Nutrition consulted for nutritional assessment. Family requesting appetite stimulant. Megace was started in the hospital yesterday. 7. Lung cancer- patient currently on chemotherapy, called and discussed with oncology.    8. Goals of care- Discussed with family in detail, patient had very poor prognoses, with Worsening neutropenia, persistent hypotension, respiratory failure, worsening renal failure, worsening mental status. His chances of any meaningful recovery were  almost negligible. Family understood the situation and wanted comfort care.Was started Dilaudid infusion 0.5 mg/hr, titrated to comfort. Patient expired on 2015/02/26 at 1222  Time: 30 min  Signed:  LAMA,GAGAN S  [facility] 02/15/2015, 10:45 AM

## 2015-04-07 ENCOUNTER — Ambulatory Visit: Payer: Medicare HMO | Admitting: Radiation Oncology

## 2016-01-04 ENCOUNTER — Ambulatory Visit: Payer: Medicare HMO | Admitting: Family

## 2016-01-04 ENCOUNTER — Encounter (HOSPITAL_COMMUNITY): Payer: Medicare HMO

## 2016-03-06 IMAGING — US US BIOPSY
1 series · 8 of 8 positions shown · non-contrast
Comparison: none

CLINICAL DATA: 81-year-old with a left central hilar lesion and
suspicious liver lesions. Tissue diagnosis is needed.

[Series 1: us biopsy · 0.23mm/px · 8 acquisitions, 8 frames shown]
[im 1/8]
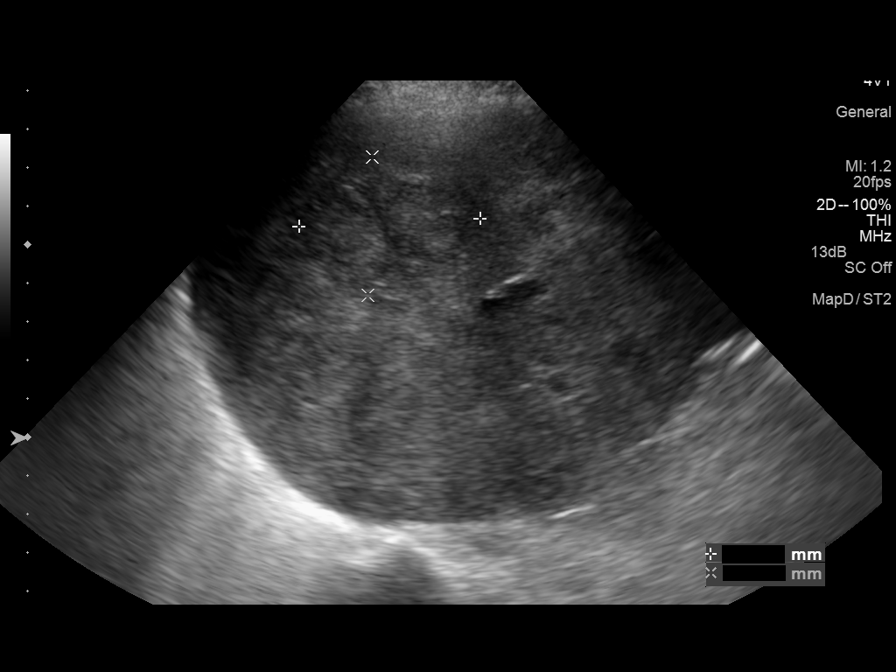
[im 2/8]
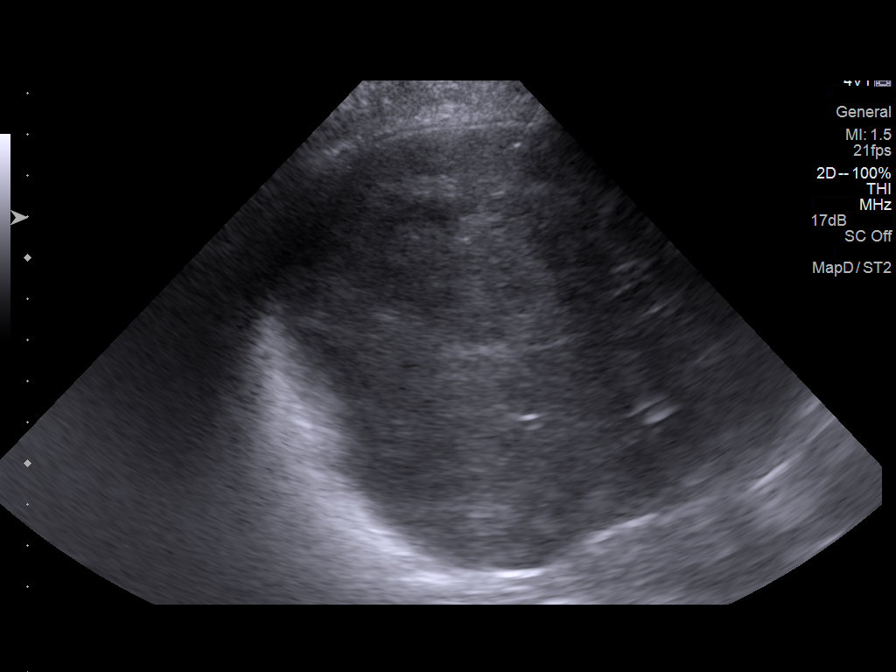
[im 3/8]
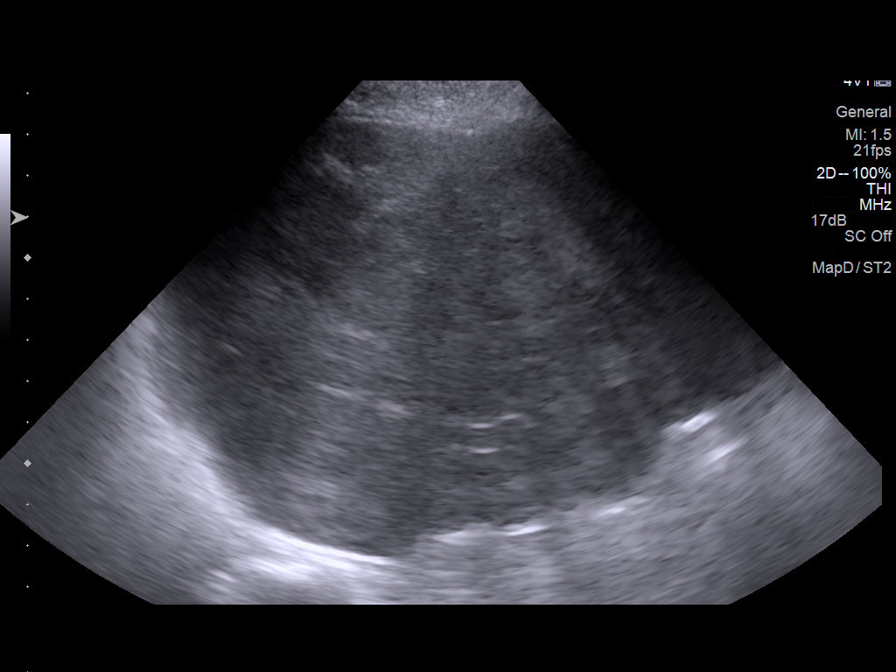
[im 4/8]
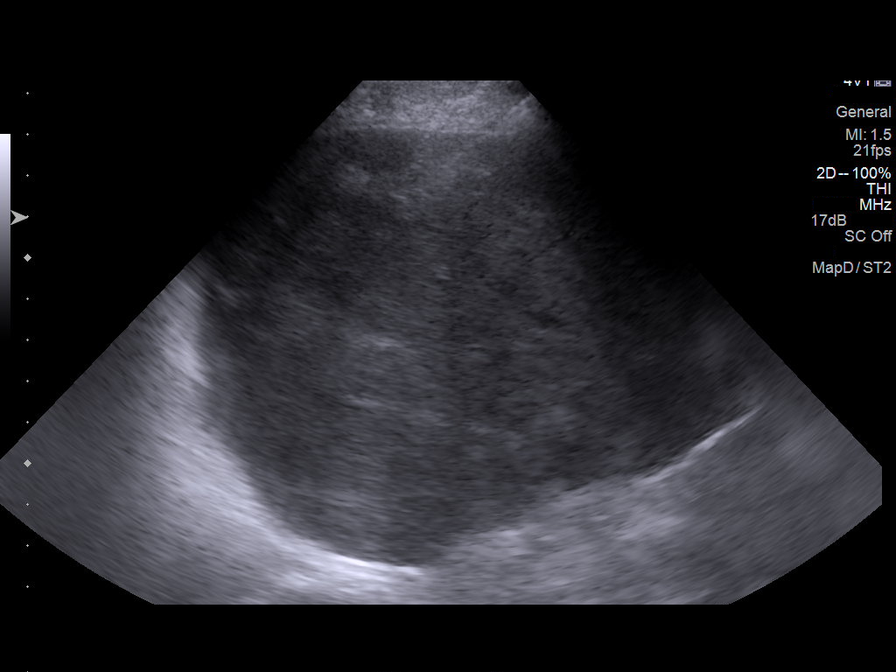
[im 5/8]
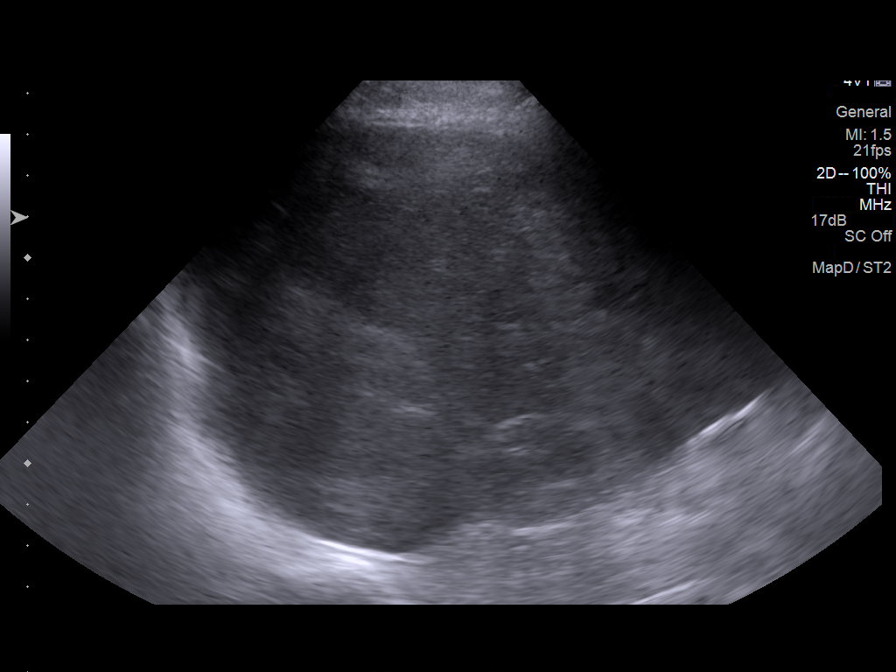
[im 6/8]
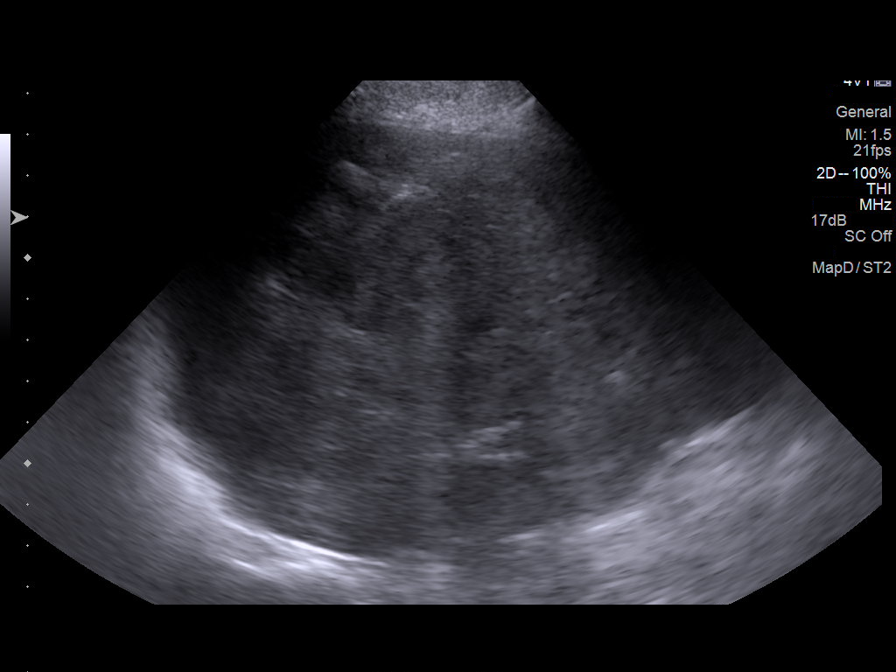
[im 7/8]
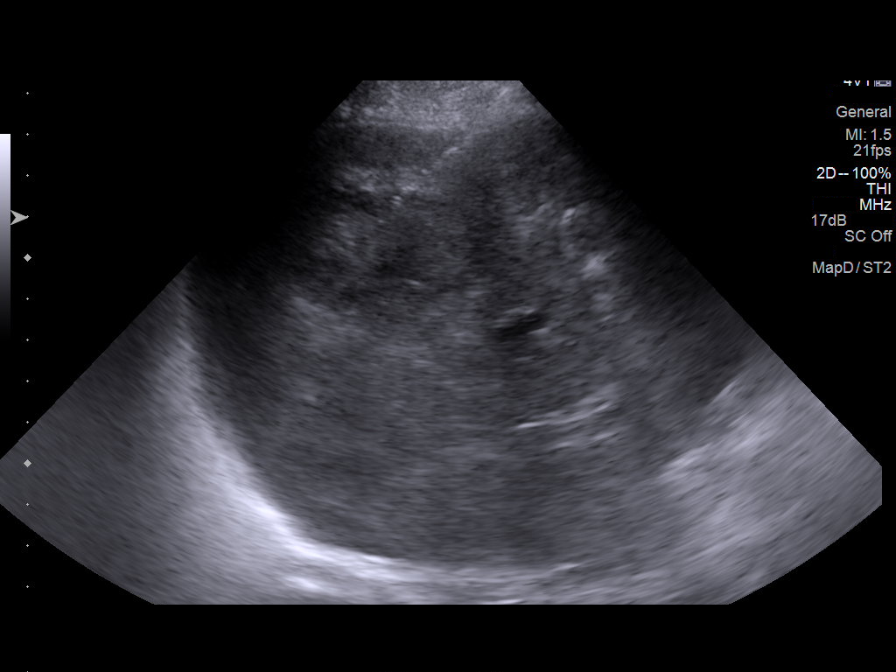
[im 8/8]
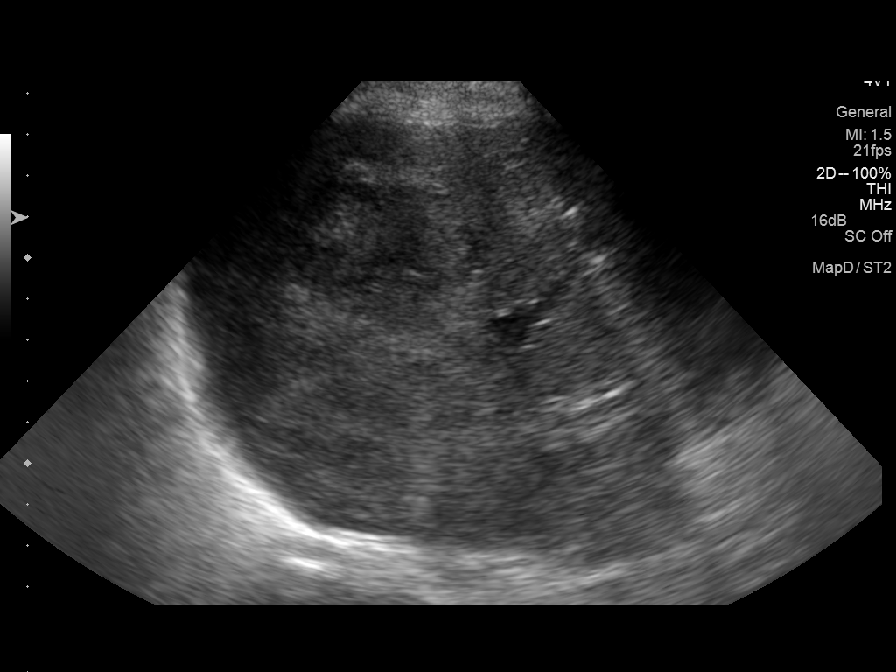

[8 of 8 positions shown; findings below may reference images not displayed]

EXAM:
ULTRASOUND-GUIDED LIVER LESION BIOPSY

FLUOROSCOPY TIME:  None

MEDICATIONS:
1 mg versed, 50 mcg fentanyl. A radiology nurse monitored the
patient for moderate sedation.

ANESTHESIA/SEDATION:
Moderate sedation time: 15 minutes

PROCEDURE:
The procedure was explained to the patient. The risks and benefits
of the procedure were discussed and the patient's questions were
addressed. Informed consent was obtained from the patient. The liver
was evaluated with ultrasound. A lesion in the right hepatic lobe
was identified. Skin was prepped and draped in sterile fashion. 1%
lidocaine was used for local anesthetic. A 17 gauge needle was
directed into the lesion and a total of 5 core biopsies were
obtained with an 18 gauge device. Three of the samples were scant.
Specimens were placed in formalin. 17 gauge needle was removed
without complication. Bandage placed over the puncture site.
FINDINGS: Liver is diffusely heterogeneous and there are not well-defined
lesions. There is a subtle heterogeneous lesion in the right hepatic
lobe measuring up to 4.7 cm. Needle position confirmed within this
lesion. Two of the core samples appeared to be adequate.

Estimated blood loss: Minimal

COMPLICATIONS:
None
IMPRESSION: Ultrasound-guided core biopsies of a right hepatic lesion.

## 2016-03-13 IMAGING — MR MR HEAD WO/W CM
1 series · 48 of 48 positions shown · IV contrast (multihance)
Comparison: CT head 08/12/2011.

CLINICAL DATA: Tonsillar cancer.  Staging exam.  Initial encounter.

EXAM:
MRI HEAD WITHOUT AND WITH CONTRAST
TECHNIQUE: Multiplanar, multiecho pulse sequences of the brain and surrounding
structures were obtained without and with intravenous contrast.
CONTRAST:  15mL MULTIHANCE GADOBENATE DIMEGLUMINE 529 MG/ML IV SOLN

[Series 101: <mpr thick range(1)> · coronal · 3.0mm · 0.66mm/px · 48 of 62 slices shown]
[im 1/62]
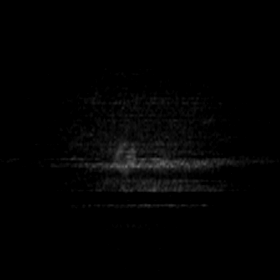
[im 2/62]
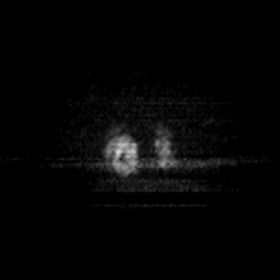
[im 3/62]
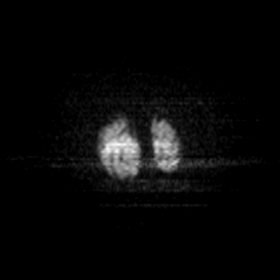
[im 4/62]
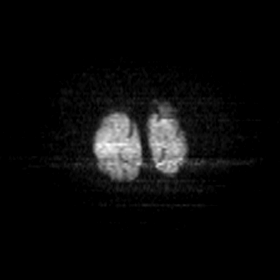
[im 6/62]
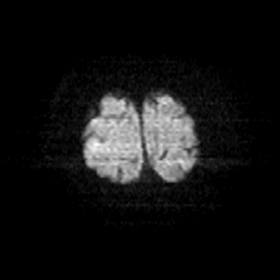
[im 7/62]
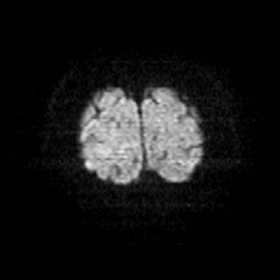
[im 8/62]
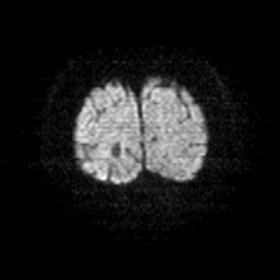
[im 10/62]
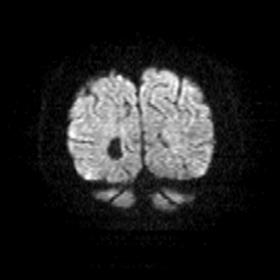
[im 11/62]
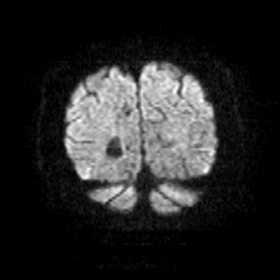
[im 12/62]
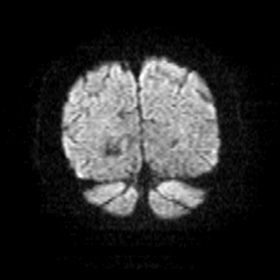
[im 13/62]
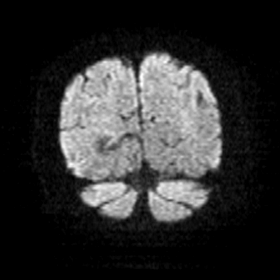
[im 15/62]
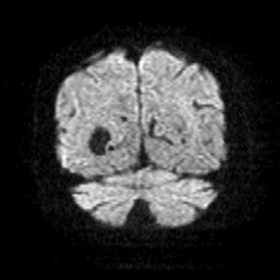
[im 16/62]
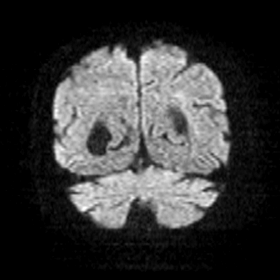
[im 17/62]
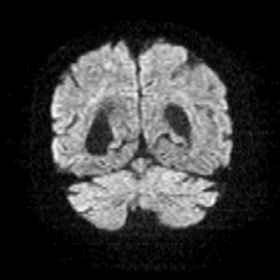
[im 19/62]
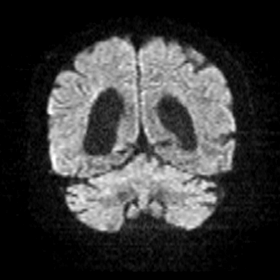
[im 20/62]
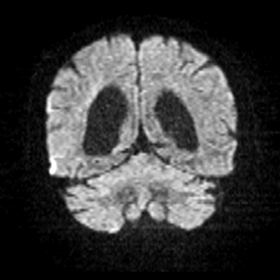
[im 21/62]
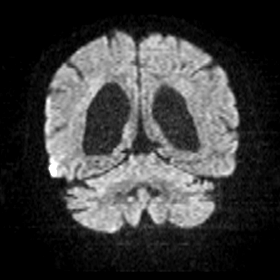
[im 23/62]
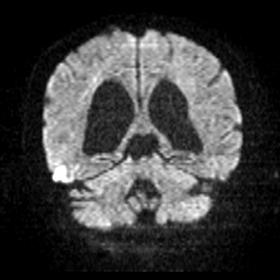
[im 24/62]
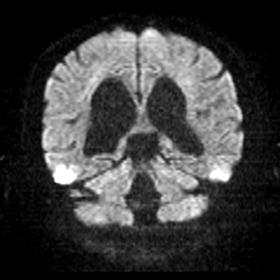
[im 25/62]
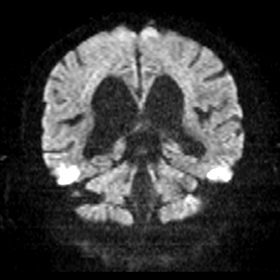
[im 26/62]
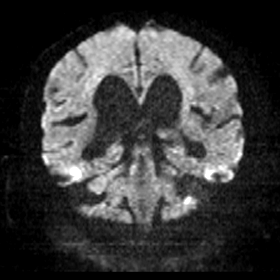
[im 28/62]
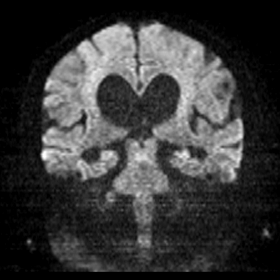
[im 29/62]
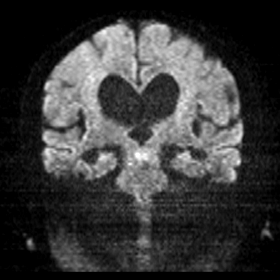
[im 30/62]
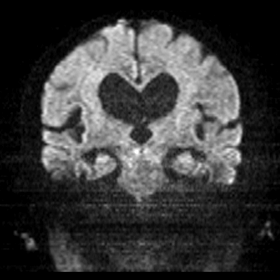
[im 32/62]
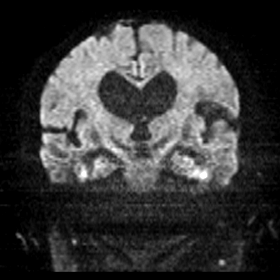
[im 33/62]
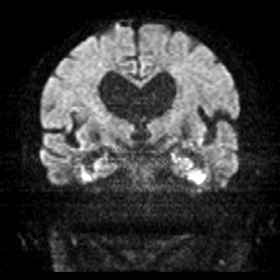
[im 34/62]
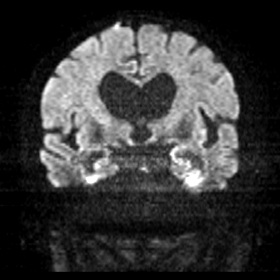
[im 36/62]
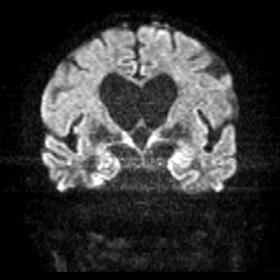
[im 37/62]
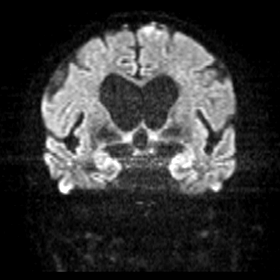
[im 38/62]
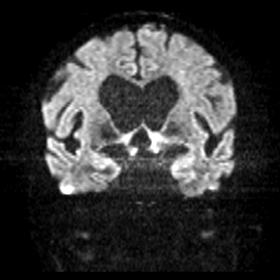
[im 39/62]
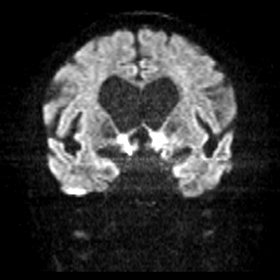
[im 41/62]
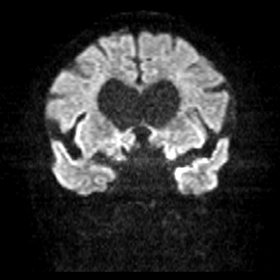
[im 42/62]
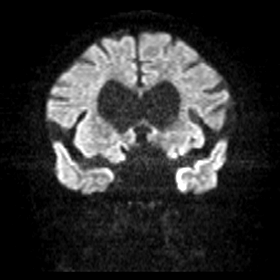
[im 43/62]
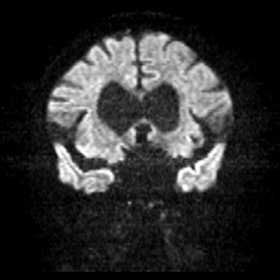
[im 45/62]
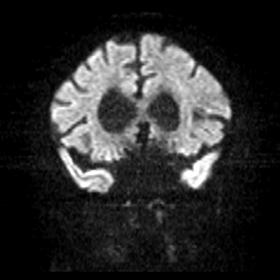
[im 46/62]
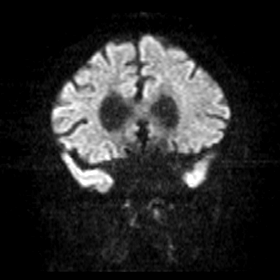
[im 47/62]
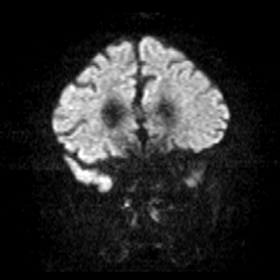
[im 49/62]
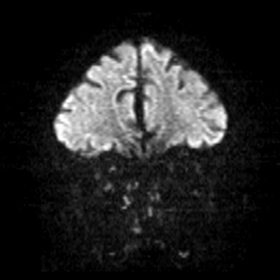
[im 50/62]
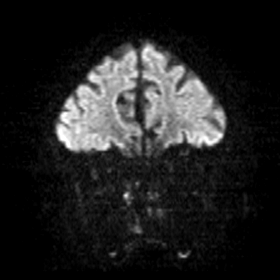
[im 51/62]
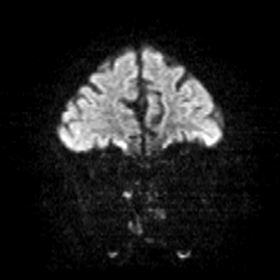
[im 52/62]
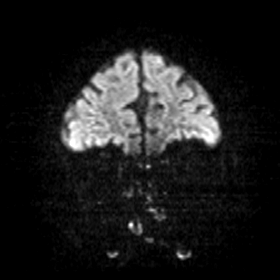
[im 54/62]
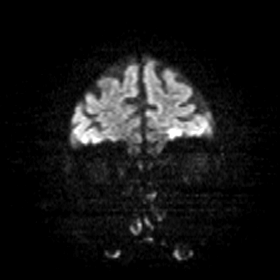
[im 55/62]
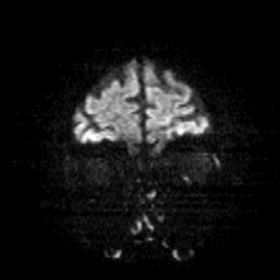
[im 56/62]
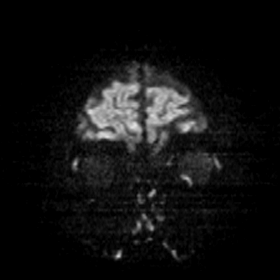
[im 58/62]
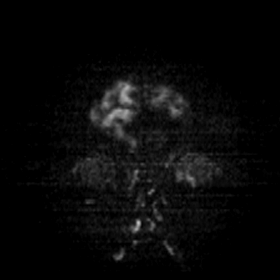
[im 59/62]
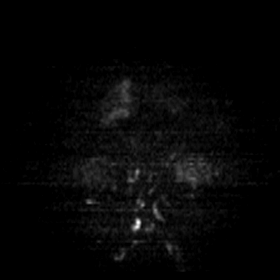
[im 60/62]
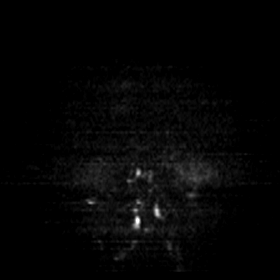
[im 62/62]
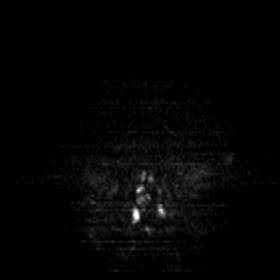

[48 of 48 positions shown; findings below may reference images not displayed]

FINDINGS: No evidence for acute stroke, acute hemorrhage, intracranial mass
lesion, or extra-axial fluid. Moderate cortical atrophy. Extensive
white matter disease. Hydrocephalus ex vacuo.

Flow voids are maintained. No chronic hemorrhage or tonsillar
herniation. Unremarkable pituitary. No osseous lesions. Post
infusion, no abnormal enhancement of the brain or meninges.

Compared with prior CT, ventricular enlargement remains prominent.
This is not favored to represent normal pressure hydrocephalus
however.
IMPRESSION: Chronic changes as described. No abnormal enhancement to suggest
intracranial metastatic disease.

## 2016-03-29 IMAGING — CR DG CHEST 1V PORT
1 series · 1 of 1 positions shown · non-contrast
Comparison: PET-CT, 01/25/2015.  Chest radiograph, 12/28/2014.

CLINICAL DATA: Short of breath.  Hypoxia.

EXAM:
PORTABLE CHEST - 1 VIEW

[AP]
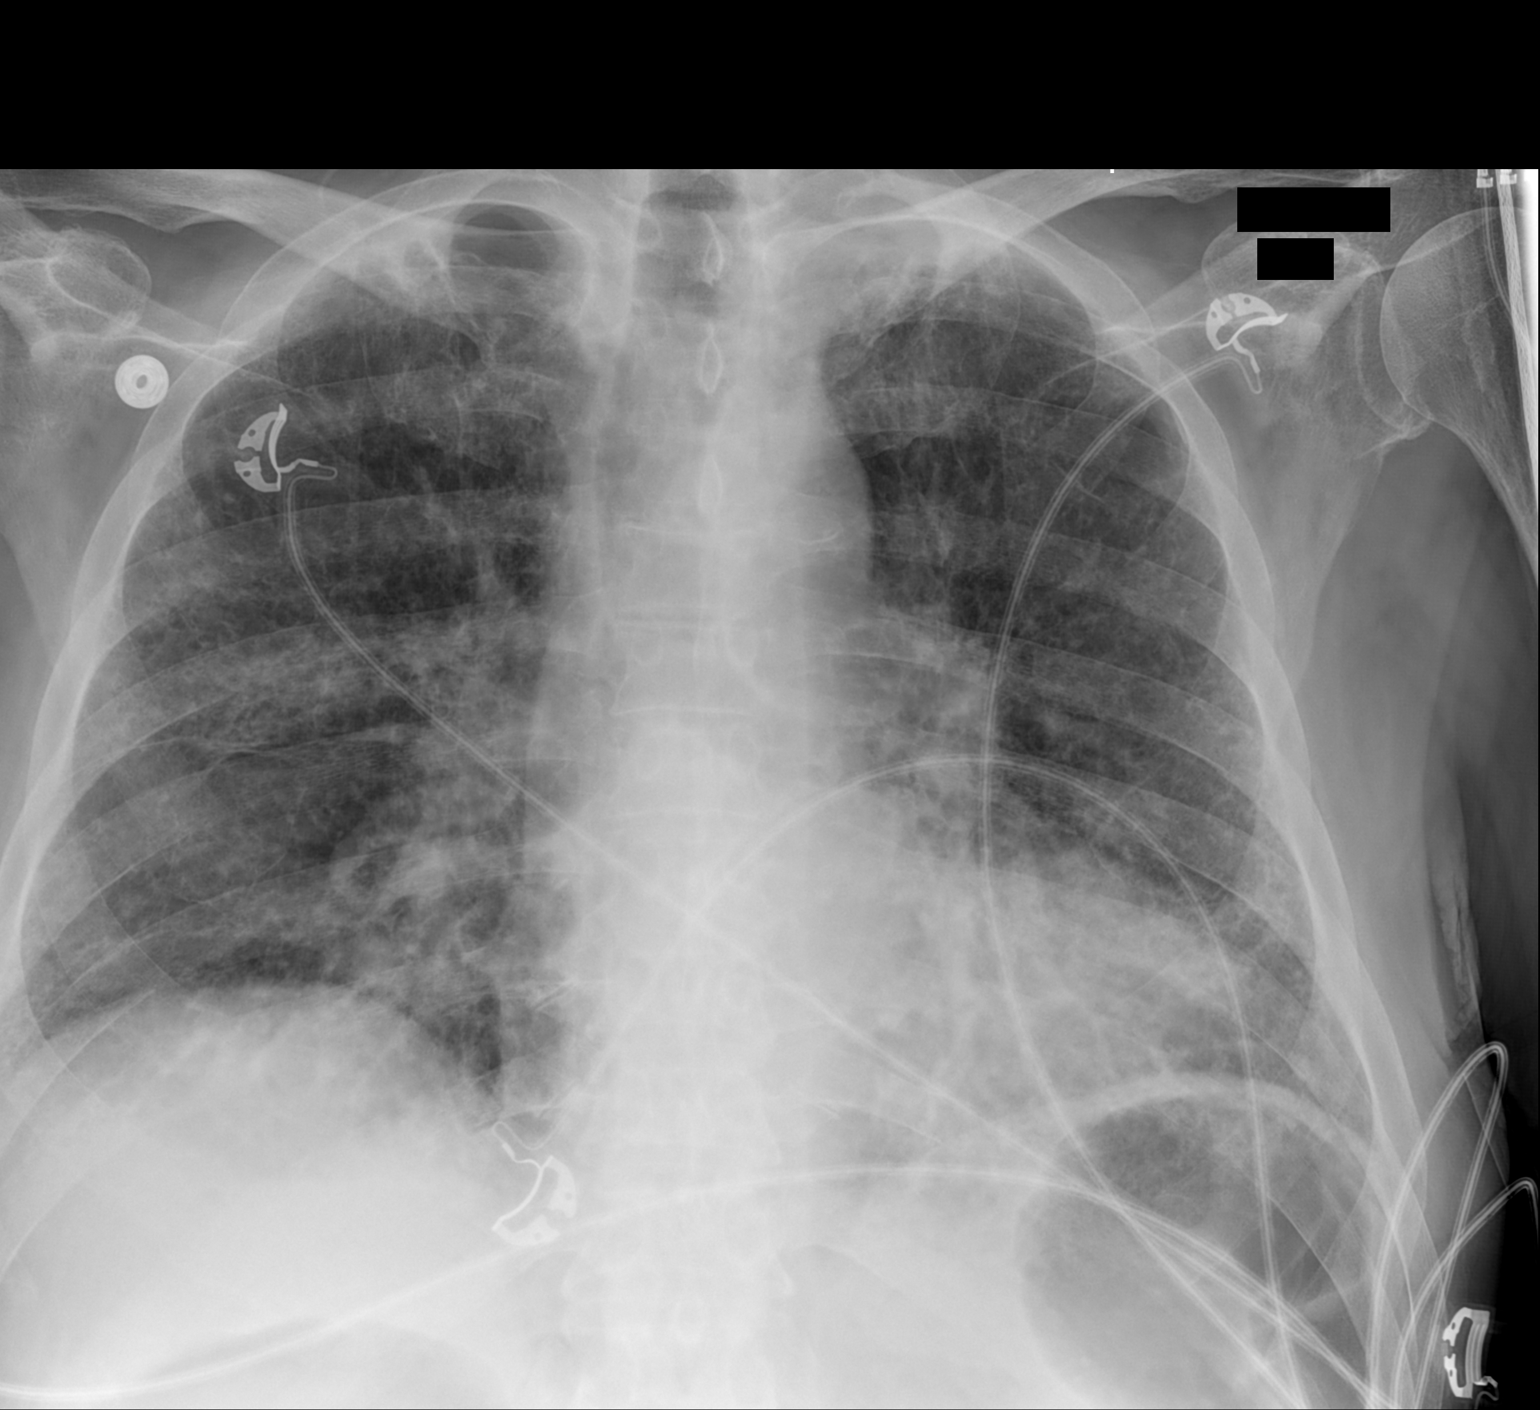

[1 of 1 positions shown; findings below may reference images not displayed]

FINDINGS: Lungs show bilateral irregular interstitial thickening consistent
with fibrosis. No focal lung consolidation is seen. There is no
pulmonary edema. No pleural effusion or pneumothorax.

Cardiac silhouette is normal in size. No mediastinal or hilar
masses.
IMPRESSION: No acute cardiopulmonary disease. Stable changes of pulmonary
fibrosis.

## 2016-04-03 ENCOUNTER — Other Ambulatory Visit: Payer: Self-pay | Admitting: Nurse Practitioner
